# Patient Record
Sex: Female | Born: 1935 | Race: White | Hispanic: No | Smoking: Never smoker
Health system: Southern US, Community
[De-identification: ages and names within clinical notes are randomized; demographics above are authoritative.]

## PROBLEM LIST (undated history)

## (undated) DIAGNOSIS — Z87440 Personal history of urinary (tract) infections: Secondary | ICD-10-CM

## (undated) DIAGNOSIS — F0390 Unspecified dementia without behavioral disturbance: Secondary | ICD-10-CM

## (undated) DIAGNOSIS — E785 Hyperlipidemia, unspecified: Secondary | ICD-10-CM

## (undated) DIAGNOSIS — T7840XA Allergy, unspecified, initial encounter: Secondary | ICD-10-CM

## (undated) DIAGNOSIS — J189 Pneumonia, unspecified organism: Secondary | ICD-10-CM

## (undated) DIAGNOSIS — I10 Essential (primary) hypertension: Secondary | ICD-10-CM

## (undated) DIAGNOSIS — I2510 Atherosclerotic heart disease of native coronary artery without angina pectoris: Secondary | ICD-10-CM

## (undated) DIAGNOSIS — N200 Calculus of kidney: Secondary | ICD-10-CM

## (undated) DIAGNOSIS — K219 Gastro-esophageal reflux disease without esophagitis: Secondary | ICD-10-CM

## (undated) DIAGNOSIS — Z8711 Personal history of peptic ulcer disease: Secondary | ICD-10-CM

## (undated) DIAGNOSIS — I509 Heart failure, unspecified: Secondary | ICD-10-CM

## (undated) DIAGNOSIS — C649 Malignant neoplasm of unspecified kidney, except renal pelvis: Secondary | ICD-10-CM

## (undated) DIAGNOSIS — R51 Headache: Secondary | ICD-10-CM

## (undated) DIAGNOSIS — N189 Chronic kidney disease, unspecified: Secondary | ICD-10-CM

## (undated) DIAGNOSIS — M1990 Unspecified osteoarthritis, unspecified site: Secondary | ICD-10-CM

## (undated) DIAGNOSIS — Z8739 Personal history of other diseases of the musculoskeletal system and connective tissue: Secondary | ICD-10-CM

## (undated) DIAGNOSIS — K589 Irritable bowel syndrome without diarrhea: Secondary | ICD-10-CM

## (undated) DIAGNOSIS — E119 Type 2 diabetes mellitus without complications: Secondary | ICD-10-CM

## (undated) DIAGNOSIS — M8580 Other specified disorders of bone density and structure, unspecified site: Secondary | ICD-10-CM

## (undated) DIAGNOSIS — I219 Acute myocardial infarction, unspecified: Secondary | ICD-10-CM

## (undated) DIAGNOSIS — K8050 Calculus of bile duct without cholangitis or cholecystitis without obstruction: Secondary | ICD-10-CM

## (undated) DIAGNOSIS — Z9289 Personal history of other medical treatment: Secondary | ICD-10-CM

## (undated) DIAGNOSIS — R519 Headache, unspecified: Secondary | ICD-10-CM

## (undated) DIAGNOSIS — Z950 Presence of cardiac pacemaker: Secondary | ICD-10-CM

## (undated) HISTORY — PX: ERCP: SHX60

## (undated) HISTORY — PX: COLONOSCOPY W/ POLYPECTOMY: SHX1380

## (undated) HISTORY — DX: Irritable bowel syndrome, unspecified: K58.9

## (undated) HISTORY — DX: Other specified disorders of bone density and structure, unspecified site: M85.80

## (undated) HISTORY — DX: Atherosclerotic heart disease of native coronary artery without angina pectoris: I25.10

## (undated) HISTORY — PX: PACEMAKER LEAD REMOVAL: SHX5064

## (undated) HISTORY — DX: Heart failure, unspecified: I50.9

## (undated) HISTORY — DX: Hyperlipidemia, unspecified: E78.5

## (undated) HISTORY — DX: Chronic kidney disease, unspecified: N18.9

## (undated) HISTORY — DX: Gastro-esophageal reflux disease without esophagitis: K21.9

## (undated) HISTORY — PX: CYSTOSCOPY W/ STONE MANIPULATION: SHX1427

## (undated) HISTORY — PX: INSERT / REPLACE / REMOVE PACEMAKER: SUR710

## (undated) HISTORY — DX: Essential (primary) hypertension: I10

## (undated) HISTORY — DX: Allergy, unspecified, initial encounter: T78.40XA

## (undated) HISTORY — DX: Calculus of bile duct without cholangitis or cholecystitis without obstruction: K80.50

## (undated) HISTORY — DX: Unspecified osteoarthritis, unspecified site: M19.90

---

## 1980-09-15 DIAGNOSIS — Z9289 Personal history of other medical treatment: Secondary | ICD-10-CM

## 1980-09-15 HISTORY — DX: Personal history of other medical treatment: Z92.89

## 1992-09-15 HISTORY — PX: INCONTINENCE SURGERY: SHX676

## 1992-09-15 HISTORY — PX: ABDOMINAL HYSTERECTOMY: SHX81

## 1993-09-15 HISTORY — PX: CHOLECYSTECTOMY: SHX55

## 1999-10-29 ENCOUNTER — Ambulatory Visit: Admission: RE | Admit: 1999-10-29 | Discharge: 1999-10-29 | Payer: Self-pay | Admitting: Cardiology

## 1999-10-29 ENCOUNTER — Encounter: Payer: Self-pay | Admitting: Emergency Medicine

## 1999-10-29 ENCOUNTER — Inpatient Hospital Stay (HOSPITAL_COMMUNITY): Admission: EM | Admit: 1999-10-29 | Discharge: 1999-10-30 | Payer: Self-pay | Admitting: Emergency Medicine

## 1999-10-30 ENCOUNTER — Encounter: Payer: Self-pay | Admitting: Internal Medicine

## 2000-09-15 DIAGNOSIS — M8580 Other specified disorders of bone density and structure, unspecified site: Secondary | ICD-10-CM

## 2000-09-15 DIAGNOSIS — C649 Malignant neoplasm of unspecified kidney, except renal pelvis: Secondary | ICD-10-CM

## 2000-09-15 HISTORY — DX: Malignant neoplasm of unspecified kidney, except renal pelvis: C64.9

## 2000-09-15 HISTORY — DX: Other specified disorders of bone density and structure, unspecified site: M85.80

## 2000-11-05 ENCOUNTER — Encounter: Payer: Self-pay | Admitting: Family Medicine

## 2000-11-05 ENCOUNTER — Encounter: Admission: RE | Admit: 2000-11-05 | Discharge: 2000-11-05 | Payer: Self-pay | Admitting: Family Medicine

## 2000-12-23 ENCOUNTER — Encounter: Payer: Self-pay | Admitting: Cardiology

## 2000-12-23 ENCOUNTER — Encounter: Admission: RE | Admit: 2000-12-23 | Discharge: 2000-12-23 | Payer: Self-pay | Admitting: Cardiology

## 2001-05-04 ENCOUNTER — Emergency Department (HOSPITAL_COMMUNITY): Admission: EM | Admit: 2001-05-04 | Discharge: 2001-05-04 | Payer: Self-pay | Admitting: *Deleted

## 2001-05-06 ENCOUNTER — Encounter: Payer: Self-pay | Admitting: Urology

## 2001-05-06 ENCOUNTER — Encounter: Admission: RE | Admit: 2001-05-06 | Discharge: 2001-05-06 | Payer: Self-pay | Admitting: Urology

## 2001-05-16 HISTORY — PX: PARTIAL NEPHRECTOMY: SHX414

## 2001-06-01 ENCOUNTER — Encounter: Payer: Self-pay | Admitting: Urology

## 2001-06-07 ENCOUNTER — Inpatient Hospital Stay (HOSPITAL_COMMUNITY): Admission: RE | Admit: 2001-06-07 | Discharge: 2001-06-10 | Payer: Self-pay | Admitting: Urology

## 2001-06-07 ENCOUNTER — Encounter (INDEPENDENT_AMBULATORY_CARE_PROVIDER_SITE_OTHER): Payer: Self-pay | Admitting: *Deleted

## 2001-09-15 DIAGNOSIS — K219 Gastro-esophageal reflux disease without esophagitis: Secondary | ICD-10-CM

## 2001-09-15 HISTORY — DX: Gastro-esophageal reflux disease without esophagitis: K21.9

## 2002-02-18 ENCOUNTER — Encounter: Payer: Self-pay | Admitting: Emergency Medicine

## 2002-02-18 ENCOUNTER — Emergency Department (HOSPITAL_COMMUNITY): Admission: EM | Admit: 2002-02-18 | Discharge: 2002-02-18 | Payer: Self-pay | Admitting: Emergency Medicine

## 2002-02-21 ENCOUNTER — Encounter: Payer: Self-pay | Admitting: Urology

## 2002-02-21 ENCOUNTER — Encounter: Admission: RE | Admit: 2002-02-21 | Discharge: 2002-02-21 | Payer: Self-pay | Admitting: Plastic Surgery

## 2002-02-23 ENCOUNTER — Encounter: Payer: Self-pay | Admitting: Urology

## 2002-02-23 ENCOUNTER — Encounter: Admission: RE | Admit: 2002-02-23 | Discharge: 2002-02-23 | Payer: Self-pay | Admitting: Urology

## 2002-03-10 ENCOUNTER — Encounter: Payer: Self-pay | Admitting: Neurology

## 2002-03-10 ENCOUNTER — Encounter: Admission: RE | Admit: 2002-03-10 | Discharge: 2002-03-10 | Payer: Self-pay | Admitting: Neurology

## 2002-03-15 HISTORY — PX: BI-VENTRICULAR IMPLANTABLE CARDIOVERTER DEFIBRILLATOR  (CRT-D): SHX5747

## 2002-03-16 ENCOUNTER — Encounter: Admission: RE | Admit: 2002-03-16 | Discharge: 2002-06-14 | Payer: Self-pay | Admitting: Neurology

## 2002-03-25 ENCOUNTER — Encounter: Payer: Self-pay | Admitting: Family Medicine

## 2002-03-25 ENCOUNTER — Encounter: Admission: RE | Admit: 2002-03-25 | Discharge: 2002-03-25 | Payer: Self-pay | Admitting: Family Medicine

## 2002-03-29 ENCOUNTER — Inpatient Hospital Stay (HOSPITAL_COMMUNITY): Admission: EM | Admit: 2002-03-29 | Discharge: 2002-04-01 | Payer: Self-pay | Admitting: Emergency Medicine

## 2002-03-29 ENCOUNTER — Encounter: Payer: Self-pay | Admitting: Emergency Medicine

## 2002-03-31 ENCOUNTER — Encounter: Payer: Self-pay | Admitting: Cardiology

## 2002-04-06 ENCOUNTER — Inpatient Hospital Stay (HOSPITAL_COMMUNITY): Admission: AD | Admit: 2002-04-06 | Discharge: 2002-04-13 | Payer: Self-pay | Admitting: Internal Medicine

## 2002-04-07 ENCOUNTER — Encounter: Payer: Self-pay | Admitting: Internal Medicine

## 2002-06-07 ENCOUNTER — Encounter: Payer: Self-pay | Admitting: Urology

## 2002-06-07 ENCOUNTER — Encounter: Admission: RE | Admit: 2002-06-07 | Discharge: 2002-06-07 | Payer: Self-pay | Admitting: Urology

## 2002-07-20 ENCOUNTER — Ambulatory Visit (HOSPITAL_COMMUNITY): Admission: RE | Admit: 2002-07-20 | Discharge: 2002-07-20 | Payer: Self-pay | Admitting: Internal Medicine

## 2002-07-20 ENCOUNTER — Encounter: Payer: Self-pay | Admitting: Internal Medicine

## 2002-07-20 ENCOUNTER — Encounter (INDEPENDENT_AMBULATORY_CARE_PROVIDER_SITE_OTHER): Payer: Self-pay | Admitting: Specialist

## 2003-03-27 ENCOUNTER — Encounter: Payer: Self-pay | Admitting: Family Medicine

## 2003-03-27 ENCOUNTER — Encounter: Admission: RE | Admit: 2003-03-27 | Discharge: 2003-03-27 | Payer: Self-pay | Admitting: Family Medicine

## 2003-04-25 ENCOUNTER — Emergency Department (HOSPITAL_COMMUNITY): Admission: EM | Admit: 2003-04-25 | Discharge: 2003-04-25 | Payer: Self-pay | Admitting: Emergency Medicine

## 2003-04-25 ENCOUNTER — Encounter: Payer: Self-pay | Admitting: Emergency Medicine

## 2003-07-22 ENCOUNTER — Inpatient Hospital Stay (HOSPITAL_COMMUNITY): Admission: EM | Admit: 2003-07-22 | Discharge: 2003-07-26 | Payer: Self-pay

## 2003-07-24 ENCOUNTER — Encounter: Payer: Self-pay | Admitting: Cardiology

## 2003-08-25 ENCOUNTER — Ambulatory Visit (HOSPITAL_COMMUNITY): Admission: RE | Admit: 2003-08-25 | Discharge: 2003-08-25 | Payer: Self-pay | Admitting: Internal Medicine

## 2003-08-30 ENCOUNTER — Ambulatory Visit (HOSPITAL_COMMUNITY): Admission: RE | Admit: 2003-08-30 | Discharge: 2003-08-30 | Payer: Self-pay | Admitting: Internal Medicine

## 2003-09-27 ENCOUNTER — Ambulatory Visit (HOSPITAL_COMMUNITY): Admission: RE | Admit: 2003-09-27 | Discharge: 2003-09-28 | Payer: Self-pay | Admitting: Internal Medicine

## 2004-07-18 ENCOUNTER — Ambulatory Visit: Payer: Self-pay

## 2004-07-24 ENCOUNTER — Ambulatory Visit: Payer: Self-pay | Admitting: Internal Medicine

## 2004-08-17 ENCOUNTER — Ambulatory Visit: Payer: Self-pay | Admitting: Family Medicine

## 2004-09-12 ENCOUNTER — Ambulatory Visit: Payer: Self-pay | Admitting: Family Medicine

## 2004-09-19 ENCOUNTER — Ambulatory Visit: Payer: Self-pay | Admitting: Internal Medicine

## 2004-10-02 ENCOUNTER — Ambulatory Visit: Payer: Self-pay | Admitting: Family Medicine

## 2004-10-02 ENCOUNTER — Encounter: Admission: RE | Admit: 2004-10-02 | Discharge: 2004-10-02 | Payer: Self-pay | Admitting: Family Medicine

## 2004-10-09 ENCOUNTER — Ambulatory Visit: Payer: Self-pay | Admitting: Family Medicine

## 2004-10-15 ENCOUNTER — Ambulatory Visit: Payer: Self-pay | Admitting: Family Medicine

## 2004-10-17 ENCOUNTER — Encounter: Admission: RE | Admit: 2004-10-17 | Discharge: 2004-10-17 | Payer: Self-pay | Admitting: Family Medicine

## 2004-10-17 ENCOUNTER — Ambulatory Visit: Payer: Self-pay | Admitting: Family Medicine

## 2004-10-24 ENCOUNTER — Ambulatory Visit: Payer: Self-pay | Admitting: Family Medicine

## 2004-11-13 ENCOUNTER — Ambulatory Visit: Payer: Self-pay | Admitting: Family Medicine

## 2004-11-15 ENCOUNTER — Ambulatory Visit: Payer: Self-pay | Admitting: Family Medicine

## 2004-11-19 ENCOUNTER — Ambulatory Visit: Payer: Self-pay | Admitting: Cardiovascular Disease

## 2004-11-29 ENCOUNTER — Ambulatory Visit: Payer: Self-pay | Admitting: Family Medicine

## 2004-12-14 ENCOUNTER — Ambulatory Visit: Payer: Self-pay | Admitting: Family Medicine

## 2005-01-13 ENCOUNTER — Ambulatory Visit: Payer: Self-pay | Admitting: Family Medicine

## 2005-01-30 ENCOUNTER — Ambulatory Visit: Payer: Self-pay | Admitting: Family Medicine

## 2005-03-04 ENCOUNTER — Inpatient Hospital Stay (HOSPITAL_COMMUNITY): Admission: EM | Admit: 2005-03-04 | Discharge: 2005-03-10 | Payer: Self-pay | Admitting: Emergency Medicine

## 2005-03-05 ENCOUNTER — Ambulatory Visit: Payer: Self-pay | Admitting: Internal Medicine

## 2005-03-06 ENCOUNTER — Ambulatory Visit: Payer: Self-pay | Admitting: Gastroenterology

## 2005-03-07 ENCOUNTER — Encounter: Payer: Self-pay | Admitting: Gastroenterology

## 2005-03-19 ENCOUNTER — Ambulatory Visit: Payer: Self-pay | Admitting: Family Medicine

## 2005-04-22 ENCOUNTER — Ambulatory Visit: Payer: Self-pay | Admitting: Family Medicine

## 2005-05-05 ENCOUNTER — Ambulatory Visit: Payer: Self-pay | Admitting: Family Medicine

## 2005-05-20 ENCOUNTER — Ambulatory Visit: Payer: Self-pay | Admitting: Family Medicine

## 2005-05-29 ENCOUNTER — Ambulatory Visit: Payer: Self-pay | Admitting: Family Medicine

## 2005-06-03 ENCOUNTER — Ambulatory Visit: Payer: Self-pay | Admitting: Cardiology

## 2005-06-04 ENCOUNTER — Ambulatory Visit: Payer: Self-pay | Admitting: Internal Medicine

## 2005-06-05 ENCOUNTER — Ambulatory Visit: Payer: Self-pay

## 2005-06-25 ENCOUNTER — Ambulatory Visit: Payer: Self-pay | Admitting: Family Medicine

## 2005-08-01 ENCOUNTER — Ambulatory Visit: Payer: Self-pay | Admitting: Family Medicine

## 2005-09-01 ENCOUNTER — Ambulatory Visit: Payer: Self-pay | Admitting: Internal Medicine

## 2005-11-03 ENCOUNTER — Ambulatory Visit: Payer: Self-pay | Admitting: Family Medicine

## 2005-11-17 ENCOUNTER — Ambulatory Visit: Payer: Self-pay | Admitting: Family Medicine

## 2005-12-23 ENCOUNTER — Ambulatory Visit: Payer: Self-pay | Admitting: Internal Medicine

## 2005-12-26 ENCOUNTER — Ambulatory Visit: Payer: Self-pay | Admitting: Cardiology

## 2006-01-22 ENCOUNTER — Ambulatory Visit: Payer: Self-pay | Admitting: Internal Medicine

## 2006-02-25 ENCOUNTER — Ambulatory Visit: Payer: Self-pay | Admitting: Family Medicine

## 2006-02-26 ENCOUNTER — Ambulatory Visit: Payer: Self-pay | Admitting: Internal Medicine

## 2006-03-26 ENCOUNTER — Ambulatory Visit: Payer: Self-pay | Admitting: Internal Medicine

## 2006-04-23 ENCOUNTER — Ambulatory Visit: Payer: Self-pay | Admitting: Internal Medicine

## 2006-05-16 DIAGNOSIS — N189 Chronic kidney disease, unspecified: Secondary | ICD-10-CM

## 2006-05-16 HISTORY — DX: Chronic kidney disease, unspecified: N18.9

## 2006-05-28 ENCOUNTER — Ambulatory Visit: Payer: Self-pay | Admitting: Internal Medicine

## 2006-05-30 ENCOUNTER — Ambulatory Visit: Payer: Self-pay | Admitting: Family Medicine

## 2006-06-04 ENCOUNTER — Inpatient Hospital Stay (HOSPITAL_COMMUNITY): Admission: EM | Admit: 2006-06-04 | Discharge: 2006-06-17 | Payer: Self-pay | Admitting: Emergency Medicine

## 2006-06-08 ENCOUNTER — Ambulatory Visit: Payer: Self-pay | Admitting: Internal Medicine

## 2006-06-23 ENCOUNTER — Ambulatory Visit: Payer: Self-pay | Admitting: Family Medicine

## 2006-06-25 ENCOUNTER — Ambulatory Visit: Payer: Self-pay | Admitting: Internal Medicine

## 2006-06-26 ENCOUNTER — Ambulatory Visit: Payer: Self-pay | Admitting: Cardiology

## 2006-07-15 ENCOUNTER — Ambulatory Visit: Payer: Self-pay | Admitting: Family Medicine

## 2006-07-23 ENCOUNTER — Ambulatory Visit: Payer: Self-pay | Admitting: Internal Medicine

## 2006-07-30 ENCOUNTER — Ambulatory Visit: Payer: Self-pay | Admitting: Family Medicine

## 2006-08-13 ENCOUNTER — Ambulatory Visit: Payer: Self-pay | Admitting: Internal Medicine

## 2006-08-25 ENCOUNTER — Ambulatory Visit: Payer: Self-pay | Admitting: Family Medicine

## 2006-08-27 ENCOUNTER — Ambulatory Visit: Payer: Self-pay | Admitting: Internal Medicine

## 2006-09-01 ENCOUNTER — Ambulatory Visit: Payer: Self-pay | Admitting: Family Medicine

## 2006-09-15 HISTORY — PX: COLONOSCOPY: SHX174

## 2006-09-22 ENCOUNTER — Ambulatory Visit: Payer: Self-pay | Admitting: Internal Medicine

## 2006-09-24 ENCOUNTER — Ambulatory Visit: Payer: Self-pay | Admitting: Internal Medicine

## 2006-09-25 ENCOUNTER — Ambulatory Visit: Payer: Self-pay | Admitting: Family Medicine

## 2006-09-25 LAB — CONVERTED CEMR LAB
CO2: 26 meq/L (ref 19–32)
Glucose, Bld: 95 mg/dL (ref 70–99)
Lymphocytes Relative: 35 % (ref 12–46)
Neutro Abs: 2.6 10*3/uL (ref 1.7–7.7)
Platelets: 154 10*3/uL (ref 150–400)
Potassium: 4.3 meq/L (ref 3.5–5.3)
RDW: 14.2 % — ABNORMAL HIGH (ref 11.5–14.0)
Sodium: 143 meq/L (ref 135–145)

## 2006-10-05 ENCOUNTER — Ambulatory Visit: Payer: Self-pay

## 2006-10-09 ENCOUNTER — Ambulatory Visit: Payer: Self-pay | Admitting: Internal Medicine

## 2006-10-12 ENCOUNTER — Ambulatory Visit: Payer: Self-pay | Admitting: Internal Medicine

## 2006-10-12 ENCOUNTER — Encounter (INDEPENDENT_AMBULATORY_CARE_PROVIDER_SITE_OTHER): Payer: Self-pay | Admitting: Specialist

## 2006-10-26 ENCOUNTER — Ambulatory Visit: Payer: Self-pay | Admitting: Internal Medicine

## 2006-10-29 ENCOUNTER — Ambulatory Visit: Payer: Self-pay | Admitting: Internal Medicine

## 2006-12-14 ENCOUNTER — Ambulatory Visit: Payer: Self-pay | Admitting: Ophthalmology

## 2006-12-15 HISTORY — PX: CATARACT EXTRACTION W/ INTRAOCULAR LENS IMPLANT: SHX1309

## 2006-12-21 ENCOUNTER — Ambulatory Visit: Payer: Self-pay | Admitting: Ophthalmology

## 2006-12-28 ENCOUNTER — Encounter: Payer: Self-pay | Admitting: Family Medicine

## 2006-12-28 DIAGNOSIS — N19 Unspecified kidney failure: Secondary | ICD-10-CM | POA: Insufficient documentation

## 2006-12-28 DIAGNOSIS — M810 Age-related osteoporosis without current pathological fracture: Secondary | ICD-10-CM | POA: Insufficient documentation

## 2006-12-28 DIAGNOSIS — N390 Urinary tract infection, site not specified: Secondary | ICD-10-CM

## 2006-12-28 DIAGNOSIS — I10 Essential (primary) hypertension: Secondary | ICD-10-CM | POA: Insufficient documentation

## 2006-12-28 DIAGNOSIS — M1990 Unspecified osteoarthritis, unspecified site: Secondary | ICD-10-CM | POA: Insufficient documentation

## 2006-12-28 DIAGNOSIS — I2510 Atherosclerotic heart disease of native coronary artery without angina pectoris: Secondary | ICD-10-CM

## 2006-12-28 DIAGNOSIS — E119 Type 2 diabetes mellitus without complications: Secondary | ICD-10-CM

## 2006-12-28 DIAGNOSIS — E785 Hyperlipidemia, unspecified: Secondary | ICD-10-CM | POA: Insufficient documentation

## 2006-12-28 DIAGNOSIS — M109 Gout, unspecified: Secondary | ICD-10-CM

## 2006-12-28 DIAGNOSIS — J309 Allergic rhinitis, unspecified: Secondary | ICD-10-CM | POA: Insufficient documentation

## 2006-12-31 ENCOUNTER — Ambulatory Visit: Payer: Self-pay | Admitting: Internal Medicine

## 2007-01-22 ENCOUNTER — Ambulatory Visit: Payer: Self-pay | Admitting: Cardiology

## 2007-01-27 ENCOUNTER — Encounter: Payer: Self-pay | Admitting: Cardiovascular Disease

## 2007-01-27 ENCOUNTER — Ambulatory Visit: Payer: Self-pay

## 2007-01-27 LAB — CONVERTED CEMR LAB
CO2: 35 meq/L — ABNORMAL HIGH (ref 19–32)
Calcium: 9 mg/dL (ref 8.4–10.5)
Chloride: 105 meq/L (ref 96–112)
GFR calc non Af Amer: 46 mL/min
Glucose, Bld: 130 mg/dL — ABNORMAL HIGH (ref 70–99)

## 2007-02-03 ENCOUNTER — Ambulatory Visit: Payer: Self-pay

## 2007-02-04 ENCOUNTER — Ambulatory Visit: Payer: Self-pay | Admitting: Cardiology

## 2007-02-04 LAB — CONVERTED CEMR LAB
Chloride: 102 meq/L (ref 96–112)
Creatinine, Ser: 1.3 mg/dL — ABNORMAL HIGH (ref 0.4–1.2)
Glucose, Bld: 122 mg/dL — ABNORMAL HIGH (ref 70–99)
Pro B Natriuretic peptide (BNP): 123 pg/mL — ABNORMAL HIGH (ref 0.0–100.0)
Sodium: 141 meq/L (ref 135–145)

## 2007-04-16 ENCOUNTER — Ambulatory Visit: Payer: Self-pay | Admitting: Internal Medicine

## 2007-05-06 ENCOUNTER — Ambulatory Visit: Payer: Self-pay | Admitting: Internal Medicine

## 2007-06-24 ENCOUNTER — Ambulatory Visit: Payer: Self-pay | Admitting: Internal Medicine

## 2007-06-25 ENCOUNTER — Ambulatory Visit: Payer: Self-pay | Admitting: Family Medicine

## 2007-06-30 ENCOUNTER — Ambulatory Visit: Payer: Self-pay | Admitting: Internal Medicine

## 2007-07-06 ENCOUNTER — Ambulatory Visit: Payer: Self-pay | Admitting: Internal Medicine

## 2007-07-06 ENCOUNTER — Encounter: Payer: Self-pay | Admitting: Family Medicine

## 2007-07-06 LAB — CONVERTED CEMR LAB
Basophils Relative: 0.4 % (ref 0.0–1.0)
CO2: 31 meq/L (ref 19–32)
Chloride: 106 meq/L (ref 96–112)
Creatinine, Ser: 1.1 mg/dL (ref 0.4–1.2)
Eosinophils Absolute: 0.1 10*3/uL (ref 0.0–0.6)
Eosinophils Relative: 1.2 % (ref 0.0–5.0)
Glucose, Bld: 146 mg/dL — ABNORMAL HIGH (ref 70–99)
HCT: 32.8 % — ABNORMAL LOW (ref 36.0–46.0)
MCV: 86.3 fL (ref 78.0–100.0)
Neutrophils Relative %: 69.2 % (ref 43.0–77.0)
Prothrombin Time: 11.4 s (ref 10.9–13.3)
RBC: 3.8 M/uL — ABNORMAL LOW (ref 3.87–5.11)
RDW: 13 % (ref 11.5–14.6)
Sodium: 142 meq/L (ref 135–145)
WBC: 5.5 10*3/uL (ref 4.5–10.5)

## 2007-07-12 ENCOUNTER — Ambulatory Visit: Payer: Self-pay | Admitting: Internal Medicine

## 2007-07-12 ENCOUNTER — Ambulatory Visit (HOSPITAL_COMMUNITY): Admission: RE | Admit: 2007-07-12 | Discharge: 2007-07-13 | Payer: Self-pay | Admitting: Internal Medicine

## 2007-07-21 ENCOUNTER — Ambulatory Visit: Payer: Self-pay

## 2007-07-26 ENCOUNTER — Telehealth: Payer: Self-pay | Admitting: Family Medicine

## 2007-08-03 ENCOUNTER — Encounter: Payer: Self-pay | Admitting: Family Medicine

## 2007-08-23 ENCOUNTER — Ambulatory Visit: Payer: Self-pay | Admitting: Internal Medicine

## 2007-08-25 ENCOUNTER — Ambulatory Visit: Payer: Self-pay | Admitting: Family Medicine

## 2007-08-31 LAB — CONVERTED CEMR LAB
Direct LDL: 100.2 mg/dL
HDL: 30.3 mg/dL — ABNORMAL LOW (ref >39.0)
Total CHOL/HDL Ratio: 7
Triglycerides: 444 mg/dL (ref 0–149)
VLDL: 89 mg/dL — ABNORMAL HIGH (ref 0–40)

## 2007-10-18 ENCOUNTER — Ambulatory Visit: Payer: Self-pay | Admitting: Internal Medicine

## 2007-11-05 ENCOUNTER — Ambulatory Visit: Payer: Self-pay | Admitting: Family Medicine

## 2007-11-05 LAB — CONVERTED CEMR LAB
ALT: 13 units/L (ref 0–35)
AST: 14 units/L (ref 0–37)
Alkaline Phosphatase: 87 units/L (ref 39–117)
Bilirubin, Direct: 0.1 mg/dL (ref 0.0–0.3)
Cholesterol: 134 mg/dL (ref 0–200)
Total Protein: 7 g/dL (ref 6.0–8.3)

## 2007-12-16 ENCOUNTER — Encounter (INDEPENDENT_AMBULATORY_CARE_PROVIDER_SITE_OTHER): Payer: Self-pay | Admitting: *Deleted

## 2007-12-23 ENCOUNTER — Telehealth: Payer: Self-pay | Admitting: Family Medicine

## 2008-01-03 ENCOUNTER — Ambulatory Visit: Payer: Self-pay | Admitting: Internal Medicine

## 2008-01-03 LAB — CONVERTED CEMR LAB
BUN: 22 mg/dL (ref 6–23)
Creatinine, Ser: 1 mg/dL (ref 0.4–1.2)
GFR calc Af Amer: 68 mL/min
GFR calc non Af Amer: 57 mL/min
Glucose, Bld: 252 mg/dL — ABNORMAL HIGH (ref 70–99)

## 2008-01-06 ENCOUNTER — Encounter: Payer: Self-pay | Admitting: Family Medicine

## 2008-01-19 ENCOUNTER — Ambulatory Visit: Payer: Self-pay | Admitting: Internal Medicine

## 2008-01-19 LAB — CONVERTED CEMR LAB
BUN: 27 mg/dL — ABNORMAL HIGH (ref 6–23)
Chloride: 105 meq/L (ref 96–112)
Creatinine, Ser: 1 mg/dL (ref 0.4–1.2)
GFR calc Af Amer: 68 mL/min
GFR calc non Af Amer: 56 mL/min
Glucose, Bld: 144 mg/dL — ABNORMAL HIGH (ref 70–99)
Potassium: 4.2 meq/L (ref 3.5–5.1)

## 2008-01-20 ENCOUNTER — Ambulatory Visit: Payer: Self-pay | Admitting: Internal Medicine

## 2008-01-27 ENCOUNTER — Telehealth: Payer: Self-pay | Admitting: Family Medicine

## 2008-02-21 ENCOUNTER — Encounter (INDEPENDENT_AMBULATORY_CARE_PROVIDER_SITE_OTHER): Payer: Self-pay | Admitting: *Deleted

## 2008-02-28 ENCOUNTER — Ambulatory Visit: Payer: Self-pay | Admitting: Internal Medicine

## 2008-04-20 ENCOUNTER — Ambulatory Visit: Payer: Self-pay | Admitting: Internal Medicine

## 2008-04-29 ENCOUNTER — Emergency Department (HOSPITAL_COMMUNITY): Admission: EM | Admit: 2008-04-29 | Discharge: 2008-04-29 | Payer: Self-pay | Admitting: Family Medicine

## 2008-05-29 ENCOUNTER — Telehealth (INDEPENDENT_AMBULATORY_CARE_PROVIDER_SITE_OTHER): Payer: Self-pay | Admitting: *Deleted

## 2008-06-12 ENCOUNTER — Ambulatory Visit: Payer: Self-pay | Admitting: Family Medicine

## 2008-07-20 ENCOUNTER — Ambulatory Visit: Payer: Self-pay | Admitting: Internal Medicine

## 2008-07-24 ENCOUNTER — Ambulatory Visit: Payer: Self-pay | Admitting: Internal Medicine

## 2008-07-25 ENCOUNTER — Telehealth: Payer: Self-pay | Admitting: Family Medicine

## 2008-08-07 ENCOUNTER — Encounter: Payer: Self-pay | Admitting: Family Medicine

## 2008-08-07 ENCOUNTER — Ambulatory Visit: Payer: Self-pay | Admitting: Internal Medicine

## 2008-08-18 LAB — CONVERTED CEMR LAB
ALT: 41 units/L — ABNORMAL HIGH (ref 0–35)
AST: 53 units/L — ABNORMAL HIGH (ref 0–37)
BUN: 33 mg/dL — ABNORMAL HIGH (ref 6–23)
Chloride: 102 meq/L (ref 96–112)
Direct LDL: 42.3 mg/dL
GFR calc Af Amer: 61 mL/min
GFR calc non Af Amer: 51 mL/min
HDL: 31.7 mg/dL — ABNORMAL LOW (ref >39.0)
Hgb A1c MFr Bld: 7.6 % — ABNORMAL HIGH (ref 4.6–6.0)
Potassium: 4.2 meq/L (ref 3.5–5.1)
Sodium: 140 meq/L (ref 135–145)
VLDL: 51 mg/dL — ABNORMAL HIGH (ref 0–40)

## 2008-08-23 ENCOUNTER — Ambulatory Visit: Payer: Self-pay | Admitting: Family Medicine

## 2008-10-23 ENCOUNTER — Ambulatory Visit: Payer: Self-pay | Admitting: Internal Medicine

## 2008-11-16 ENCOUNTER — Ambulatory Visit: Payer: Self-pay | Admitting: Family Medicine

## 2008-11-16 DIAGNOSIS — K5289 Other specified noninfective gastroenteritis and colitis: Secondary | ICD-10-CM

## 2008-11-17 ENCOUNTER — Telehealth (INDEPENDENT_AMBULATORY_CARE_PROVIDER_SITE_OTHER): Payer: Self-pay | Admitting: Internal Medicine

## 2009-01-23 ENCOUNTER — Telehealth: Payer: Self-pay | Admitting: Family Medicine

## 2009-01-24 ENCOUNTER — Telehealth: Payer: Self-pay | Admitting: Internal Medicine

## 2009-01-24 ENCOUNTER — Emergency Department (HOSPITAL_COMMUNITY): Admission: EM | Admit: 2009-01-24 | Discharge: 2009-01-24 | Payer: Self-pay | Admitting: Family Medicine

## 2009-01-24 ENCOUNTER — Telehealth: Payer: Self-pay | Admitting: Family Medicine

## 2009-01-24 ENCOUNTER — Encounter (INDEPENDENT_AMBULATORY_CARE_PROVIDER_SITE_OTHER): Payer: Self-pay | Admitting: *Deleted

## 2009-01-25 ENCOUNTER — Ambulatory Visit: Payer: Self-pay | Admitting: Internal Medicine

## 2009-01-25 DIAGNOSIS — I728 Aneurysm of other specified arteries: Secondary | ICD-10-CM | POA: Insufficient documentation

## 2009-01-25 DIAGNOSIS — Z860100 Personal history of colon polyps, unspecified: Secondary | ICD-10-CM | POA: Insufficient documentation

## 2009-01-29 DIAGNOSIS — I429 Cardiomyopathy, unspecified: Secondary | ICD-10-CM | POA: Insufficient documentation

## 2009-01-29 DIAGNOSIS — K589 Irritable bowel syndrome without diarrhea: Secondary | ICD-10-CM

## 2009-01-29 DIAGNOSIS — I442 Atrioventricular block, complete: Secondary | ICD-10-CM

## 2009-01-29 DIAGNOSIS — I5022 Chronic systolic (congestive) heart failure: Secondary | ICD-10-CM

## 2009-02-14 ENCOUNTER — Ambulatory Visit: Payer: Self-pay | Admitting: Internal Medicine

## 2009-02-14 ENCOUNTER — Encounter: Payer: Self-pay | Admitting: Internal Medicine

## 2009-02-22 ENCOUNTER — Encounter: Payer: Self-pay | Admitting: Internal Medicine

## 2009-02-25 ENCOUNTER — Encounter: Payer: Self-pay | Admitting: Internal Medicine

## 2009-02-26 ENCOUNTER — Ambulatory Visit: Payer: Self-pay | Admitting: Internal Medicine

## 2009-04-06 ENCOUNTER — Encounter: Payer: Self-pay | Admitting: Internal Medicine

## 2009-04-26 ENCOUNTER — Ambulatory Visit: Payer: Self-pay | Admitting: Internal Medicine

## 2009-05-03 ENCOUNTER — Ambulatory Visit: Payer: Self-pay | Admitting: Gastroenterology

## 2009-05-03 ENCOUNTER — Inpatient Hospital Stay (HOSPITAL_COMMUNITY): Admission: EM | Admit: 2009-05-03 | Discharge: 2009-05-06 | Payer: Self-pay | Admitting: Emergency Medicine

## 2009-05-03 ENCOUNTER — Encounter: Payer: Self-pay | Admitting: Cardiovascular Disease

## 2009-05-03 ENCOUNTER — Ambulatory Visit: Payer: Self-pay | Admitting: Vascular Surgery

## 2009-05-03 ENCOUNTER — Ambulatory Visit: Payer: Self-pay | Admitting: Cardiovascular Disease

## 2009-05-04 ENCOUNTER — Encounter: Payer: Self-pay | Admitting: Cardiology

## 2009-05-05 ENCOUNTER — Encounter: Payer: Self-pay | Admitting: Cardiovascular Disease

## 2009-05-07 ENCOUNTER — Telehealth: Payer: Self-pay | Admitting: Internal Medicine

## 2009-05-18 ENCOUNTER — Ambulatory Visit: Payer: Self-pay | Admitting: Family Medicine

## 2009-05-18 LAB — CONVERTED CEMR LAB
Bacteria, UA: 0
Bilirubin Urine: NEGATIVE
Glucose, Urine, Semiquant: NEGATIVE
Ketones, urine, test strip: NEGATIVE
RBC / HPF: 0
Specific Gravity, Urine: 1.015

## 2009-05-19 ENCOUNTER — Encounter (INDEPENDENT_AMBULATORY_CARE_PROVIDER_SITE_OTHER): Payer: Self-pay | Admitting: Internal Medicine

## 2009-05-21 LAB — CONVERTED CEMR LAB
BUN: 30 mg/dL — ABNORMAL HIGH (ref 6–23)
Basophils Absolute: 0 10*3/uL (ref 0.0–0.1)
Calcium: 8.9 mg/dL (ref 8.4–10.5)
GFR calc non Af Amer: 45.56 mL/min (ref >60)
Glucose, Bld: 156 mg/dL — ABNORMAL HIGH (ref 70–99)
Lymphocytes Relative: 7.5 % — ABNORMAL LOW (ref 12.0–46.0)
Monocytes Relative: 9.5 % (ref 3.0–12.0)
Platelets: 200 10*3/uL (ref 150.0–400.0)
RDW: 14.2 % (ref 11.5–14.6)
Sodium: 144 meq/L (ref 135–145)
WBC: 10 10*3/uL (ref 4.5–10.5)

## 2009-05-22 ENCOUNTER — Encounter (INDEPENDENT_AMBULATORY_CARE_PROVIDER_SITE_OTHER): Payer: Self-pay | Admitting: Internal Medicine

## 2009-05-24 ENCOUNTER — Ambulatory Visit: Payer: Self-pay | Admitting: Internal Medicine

## 2009-05-25 ENCOUNTER — Ambulatory Visit: Payer: Self-pay | Admitting: Internal Medicine

## 2009-05-25 LAB — CONVERTED CEMR LAB
ALT: 50 units/L — ABNORMAL HIGH (ref 0–35)
AST: 28 units/L (ref 0–37)
Alkaline Phosphatase: 278 units/L — ABNORMAL HIGH (ref 39–117)
Basophils Absolute: 0 10*3/uL (ref 0.0–0.1)
Bilirubin, Direct: 0 mg/dL (ref 0.0–0.3)
CO2: 30 meq/L (ref 19–32)
Calcium: 9.3 mg/dL (ref 8.4–10.5)
Chloride: 103 meq/L (ref 96–112)
Eosinophils Absolute: 0.1 10*3/uL (ref 0.0–0.7)
Hemoglobin: 11 g/dL — ABNORMAL LOW (ref 12.0–15.0)
Lymphocytes Relative: 18.5 % (ref 12.0–46.0)
MCHC: 34.3 g/dL (ref 30.0–36.0)
Neutro Abs: 5.3 10*3/uL (ref 1.4–7.7)
Potassium: 4.3 meq/L (ref 3.5–5.1)
RDW: 14.3 % (ref 11.5–14.6)
Sodium: 141 meq/L (ref 135–145)
Total Protein: 6.8 g/dL (ref 6.0–8.3)

## 2009-05-30 ENCOUNTER — Ambulatory Visit (HOSPITAL_COMMUNITY): Admission: RE | Admit: 2009-05-30 | Discharge: 2009-05-30 | Payer: Self-pay | Admitting: Internal Medicine

## 2009-05-30 ENCOUNTER — Ambulatory Visit: Payer: Self-pay | Admitting: Internal Medicine

## 2009-06-04 ENCOUNTER — Telehealth: Payer: Self-pay | Admitting: Internal Medicine

## 2009-06-07 ENCOUNTER — Ambulatory Visit: Payer: Self-pay | Admitting: Family Medicine

## 2009-06-08 ENCOUNTER — Encounter: Payer: Self-pay | Admitting: Family Medicine

## 2009-06-12 ENCOUNTER — Ambulatory Visit: Payer: Self-pay | Admitting: Family Medicine

## 2009-07-11 ENCOUNTER — Ambulatory Visit: Payer: Self-pay | Admitting: Internal Medicine

## 2009-07-16 ENCOUNTER — Telehealth: Payer: Self-pay | Admitting: Family Medicine

## 2009-07-17 ENCOUNTER — Telehealth: Payer: Self-pay | Admitting: Internal Medicine

## 2009-07-20 ENCOUNTER — Ambulatory Visit: Payer: Self-pay | Admitting: Internal Medicine

## 2009-07-23 ENCOUNTER — Encounter: Payer: Self-pay | Admitting: Internal Medicine

## 2009-07-26 ENCOUNTER — Ambulatory Visit: Payer: Self-pay | Admitting: Internal Medicine

## 2009-07-30 ENCOUNTER — Encounter: Payer: Self-pay | Admitting: Internal Medicine

## 2009-07-31 ENCOUNTER — Ambulatory Visit: Payer: Self-pay | Admitting: Internal Medicine

## 2009-07-31 ENCOUNTER — Ambulatory Visit (HOSPITAL_COMMUNITY): Admission: RE | Admit: 2009-07-31 | Discharge: 2009-07-31 | Payer: Self-pay | Admitting: Internal Medicine

## 2009-08-08 ENCOUNTER — Encounter: Payer: Self-pay | Admitting: Internal Medicine

## 2009-08-15 LAB — HM DIABETES EYE EXAM: HM Diabetic Eye Exam: NORMAL

## 2009-08-17 ENCOUNTER — Ambulatory Visit: Payer: Self-pay | Admitting: Family Medicine

## 2009-09-05 ENCOUNTER — Telehealth: Payer: Self-pay | Admitting: Internal Medicine

## 2009-09-17 ENCOUNTER — Telehealth: Payer: Self-pay | Admitting: Internal Medicine

## 2009-09-21 ENCOUNTER — Ambulatory Visit: Payer: Self-pay | Admitting: Gastroenterology

## 2009-10-01 ENCOUNTER — Encounter: Payer: Self-pay | Admitting: Internal Medicine

## 2009-10-01 ENCOUNTER — Telehealth: Payer: Self-pay | Admitting: Gastroenterology

## 2009-10-01 ENCOUNTER — Telehealth: Payer: Self-pay | Admitting: Internal Medicine

## 2009-10-03 ENCOUNTER — Ambulatory Visit (HOSPITAL_COMMUNITY): Admission: RE | Admit: 2009-10-03 | Discharge: 2009-10-04 | Payer: Self-pay | Admitting: Gastroenterology

## 2009-10-03 ENCOUNTER — Encounter: Payer: Self-pay | Admitting: Gastroenterology

## 2009-10-04 ENCOUNTER — Ambulatory Visit: Payer: Self-pay | Admitting: Gastroenterology

## 2009-10-22 ENCOUNTER — Ambulatory Visit: Payer: Self-pay | Admitting: Internal Medicine

## 2009-10-22 DIAGNOSIS — E7800 Pure hypercholesterolemia, unspecified: Secondary | ICD-10-CM | POA: Insufficient documentation

## 2009-10-23 ENCOUNTER — Encounter: Payer: Self-pay | Admitting: Internal Medicine

## 2009-11-05 ENCOUNTER — Ambulatory Visit: Payer: Self-pay | Admitting: Internal Medicine

## 2009-11-07 LAB — CONVERTED CEMR LAB
AST: 17 units/L (ref 0–37)
Albumin: 3.8 g/dL (ref 3.5–5.2)
Alkaline Phosphatase: 91 units/L (ref 39–117)
Total Bilirubin: 0.4 mg/dL (ref 0.3–1.2)

## 2010-01-09 ENCOUNTER — Telehealth: Payer: Self-pay | Admitting: Family Medicine

## 2010-01-24 ENCOUNTER — Ambulatory Visit: Payer: Self-pay | Admitting: Internal Medicine

## 2010-02-18 ENCOUNTER — Ambulatory Visit: Payer: Self-pay | Admitting: Family Medicine

## 2010-02-19 LAB — CONVERTED CEMR LAB
ALT: 10 units/L (ref 0–35)
AST: 14 units/L (ref 0–37)
Creatinine, Ser: 1 mg/dL (ref 0.4–1.2)
Direct LDL: 53.8 mg/dL
GFR calc non Af Amer: 53.65 mL/min (ref >60)
Glucose, Bld: 161 mg/dL — ABNORMAL HIGH (ref 70–99)
Phosphorus: 3.4 mg/dL (ref 2.3–4.6)
Sodium: 144 meq/L (ref 135–145)
Total CHOL/HDL Ratio: 5
VLDL: 88.4 mg/dL — ABNORMAL HIGH (ref 0.0–40.0)

## 2010-03-29 ENCOUNTER — Encounter: Payer: Self-pay | Admitting: Family Medicine

## 2010-04-25 ENCOUNTER — Ambulatory Visit: Payer: Self-pay | Admitting: Internal Medicine

## 2010-04-26 ENCOUNTER — Encounter: Payer: Self-pay | Admitting: Family Medicine

## 2010-04-26 ENCOUNTER — Encounter: Payer: Self-pay | Admitting: Internal Medicine

## 2010-05-24 ENCOUNTER — Ambulatory Visit: Payer: Self-pay | Admitting: Internal Medicine

## 2010-05-24 DIAGNOSIS — E0590 Thyrotoxicosis, unspecified without thyrotoxic crisis or storm: Secondary | ICD-10-CM | POA: Insufficient documentation

## 2010-06-04 ENCOUNTER — Ambulatory Visit: Payer: Self-pay | Admitting: Family Medicine

## 2010-06-10 ENCOUNTER — Ambulatory Visit: Payer: Self-pay | Admitting: Internal Medicine

## 2010-06-10 ENCOUNTER — Encounter: Payer: Self-pay | Admitting: Internal Medicine

## 2010-06-10 ENCOUNTER — Ambulatory Visit (HOSPITAL_COMMUNITY): Admission: RE | Admit: 2010-06-10 | Discharge: 2010-06-10 | Payer: Self-pay | Admitting: Internal Medicine

## 2010-06-10 ENCOUNTER — Ambulatory Visit: Payer: Self-pay

## 2010-06-19 ENCOUNTER — Telehealth (INDEPENDENT_AMBULATORY_CARE_PROVIDER_SITE_OTHER): Payer: Self-pay | Admitting: *Deleted

## 2010-08-01 ENCOUNTER — Ambulatory Visit: Payer: Self-pay | Admitting: Internal Medicine

## 2010-08-26 ENCOUNTER — Ambulatory Visit: Payer: Self-pay | Admitting: Family Medicine

## 2010-08-26 LAB — CONVERTED CEMR LAB
Cholesterol, target level: 200 mg/dL
HDL goal, serum: 40 mg/dL
LDL Goal: 70 mg/dL

## 2010-08-26 LAB — HM DIABETES FOOT EXAM

## 2010-08-27 LAB — CONVERTED CEMR LAB
AST: 15 units/L (ref 0–37)
BUN: 20 mg/dL (ref 6–23)
CO2: 29 meq/L (ref 19–32)
Chloride: 104 meq/L (ref 96–112)
Direct LDL: 75.3 mg/dL
GFR calc non Af Amer: 66.72 mL/min (ref >60.00)
HDL: 33.1 mg/dL — ABNORMAL LOW (ref >39.00)
Potassium: 4.3 meq/L (ref 3.5–5.1)

## 2010-08-29 ENCOUNTER — Encounter (INDEPENDENT_AMBULATORY_CARE_PROVIDER_SITE_OTHER): Payer: Self-pay | Admitting: *Deleted

## 2010-10-13 LAB — CONVERTED CEMR LAB
Alkaline Phosphatase: 79 units/L (ref 39–117)
BUN: 29 mg/dL — ABNORMAL HIGH (ref 6–23)
Basophils Absolute: 0 10*3/uL (ref 0.0–0.1)
Basophils Absolute: 0 10*3/uL (ref 0.0–0.1)
Bilirubin, Direct: 0 mg/dL (ref 0.0–0.3)
CO2: 28 meq/L (ref 19–32)
Calcium: 8.7 mg/dL (ref 8.4–10.5)
Calcium: 8.9 mg/dL (ref 8.4–10.5)
Chloride: 104 meq/L (ref 96–112)
Creatinine, Ser: 1.1 mg/dL (ref 0.4–1.2)
Direct LDL: 77.1 mg/dL
Eosinophils Relative: 1.7 % (ref 0.0–5.0)
GFR calc non Af Amer: 50.4 mL/min (ref >60)
Glucose, Bld: 253 mg/dL — ABNORMAL HIGH (ref 70–99)
HCT: 33.7 % — ABNORMAL LOW (ref 36.0–46.0)
Hgb A1c MFr Bld: 8.2 % — ABNORMAL HIGH (ref 4.6–6.5)
Lymphocytes Relative: 19.3 % (ref 12.0–46.0)
Lymphocytes Relative: 29 % (ref 12–46)
Lymphs Abs: 1.2 10*3/uL (ref 0.7–4.0)
Monocytes Relative: 5.7 % (ref 3.0–12.0)
Neutro Abs: 3.8 10*3/uL (ref 1.7–7.7)
Neutrophils Relative %: 73.2 % (ref 43.0–77.0)
Platelets: 177 10*3/uL (ref 150.0–400.0)
Platelets: 218 10*3/uL (ref 150–400)
Potassium: 4 meq/L (ref 3.5–5.1)
RDW: 13.5 % (ref 11.5–14.6)
RDW: 14 % (ref 11.5–15.5)
Sodium: 144 meq/L (ref 135–145)
TSH: 0.6 microintl units/mL (ref 0.35–5.50)
Total Protein: 6.6 g/dL (ref 6.0–8.3)
Triglycerides: 318 mg/dL — ABNORMAL HIGH (ref 0.0–149.0)
WBC: 6.3 10*3/uL (ref 4.5–10.5)

## 2010-10-17 NOTE — Assessment & Plan Note (Signed)
Summary: gallstones/eval for ERCP with Spyglass/sheri    History of Present Illness Visit Type: Follow-up Visit Primary GI MD: Stan Head MD [employer] Primary Provider: [facility] Chief Complaint: Evaluation of gallstones with spyglass History of Present Illness:   [name] is a pleasant 77 year old white female referred at the request of Dr. Leone Payor for treatment of CBD stones.  These were diagnosed at ERCP in September, 2010.  A bile duct stone was removed and a stent was placed.  At repeat ERCP in November, 2010 there were numerous retained bile duct stones.  A plastic stent was reinserted.  Since that time she has felt well has had no complaints including pain or fevers.  The patient has a nonischemic cardiomyopathy and is status post radical ICD   GI Review of Systems    Reports abdominal pain.     Location of  Abdominal pain: right side.    Denies acid reflux, belching, bloating, chest pain, dysphagia with liquids, dysphagia with solids, heartburn, loss of appetite, nausea, vomiting, vomiting blood, weight loss, and  weight gain.      Reports diarrhea.     Denies anal fissure, black tarry stools, change in bowel habit, constipation, diverticulosis, fecal incontinence, heme positive stool, hemorrhoids, irritable bowel syndrome, jaundice, light color stool, liver problems, rectal bleeding, and  rectal pain.    Current Medications (verified): 1)  Altace 10 Mg Caps (Ramipril) .... One By Mouth Daily 2)  Bd Ultra-Fine Lancets  Misc (Lancets) .... Use To Check Glucose Once Daily and in The Evening 3)  Coreg 12.5 Mg Tabs (Carvedilol) .Marland Kitchen.. 1 and 1/2 Tab By Mouth Two Times A Day 4)  Gabapentin 300 Mg  Caps (Gabapentin) .... Two By Mouth Two Times A Day 5)  Nexium 40 Mg  Cpdr (Esomeprazole Magnesium) .... Take One By Mouth Daily 6)  Freestyle Test   Strp (Glucose Blood) .... Test Two Times A Day As Directed 7)  Simvastatin 20 Mg  Tabs (Simvastatin) .Marland Kitchen.. 1 By Mouth At Bedtime 8)   Furosemide 20 Mg  Tabs (Furosemide) .... Take 2 By Mouth Every Other Day 9)  Humalog Mix 75/25 Pen 75-25 % Susp (Insulin Lispro Prot & Lispro) .... 8 Units in Am and 10 Units in Pm 10)  Norvasc 5 Mg Tabs (Amlodipine Besylate) .... Take 1/2 By Mouth Two Times A Day 11)  Healthy Colon  Caps (Misc Intestinal Flora Regulat) .... Take One By Mouth Daily 12)  Vitamin D 1000 Unit Caps (Cholecalciferol) .... Take One By Mouth Daily 13)  Loperamide Hcl 2 Mg Tabs (Loperamide Hcl) .... As Needed 14)  Meloxicam 7.5 Mg Tabs (Meloxicam) .... One Tab Once or Twice Daily As Needed For Pain 15)  Dicyclomine Hcl 10 Mg Caps (Dicyclomine Hcl) .Marland Kitchen.. 1-2 By Mouth Ac Meals 16)  Pataday 0.2 % Soln (Olopatadine Hcl) .Marland Kitchen.. 1 Drop Into Each Eye Two Times A Day  Allergies (verified): 1)  ! Celebrex (Celecoxib) 2)  ! Aspirin Ec (Aspirin) 3)  ! Avelox (Moxifloxacin Hcl) 4)  ! Tricor (Fenofibrate) 5)  ! Lipitor (Atorvastatin Calcium) 6)  ! Niaspan (Niacin (Antihyperlipidemic)) 7)  ! Metformin Hcl (Metformin Hcl)  Past History:  Past Medical History: Reviewed history from 07/11/2009 and no changes required. Diabetes mellitus, type II Hypertension Coronary artery disease, pulm htn congestive heart failure, (non ish cardiomyopathy) gout Osteoarthritis, SPINE, KNEES Allergic rhinitis Hyperlipidemia, TRIG Osteopenia IBS choledocholithiasis and cholangitis  cardiol-- Tenny Craw endo- [facility]  [employer]  Past Surgical History: Reviewed history from 05/07/2009  and no changes required. 1992 CCY 1999 HYST. BLADDER TACK 1982 PUD/BLEED/TRANSFUSION 2001 CARDIAC CATH KIDNEY STONES/ CYSTO 2002 DEXA 2002 PARTIAL NEPHRECTOMY/ RENAL CELL CA 2003 COLONOS,  POLYP,HEM, DIVERTIC 2003 EGD POLYP, GASTRITIS, HH 2004 CAROTID DOPPLERS NML 2.2006 LS DEGEN. CHANGES  10/27 /08  2nd time 6.2006 HOSP DIARRHEA/ ERCP 9.2007 HOSP RENAL FAIL 07/12/07 - ICD -  Guidant Contak Renewal III 1.2008 COLONOSC POLYPS RE CK 1 YR 2008 CATARACTS  Right eye OPTHY FALL 01/1.04/3.06 MICROALB 2.02 11.06 hospitalization 8/10 nausea/vomting and chest pain with inc LFTS/ dilated bile ducts   Family History: Reviewed history from 05/25/2009 and no changes required. daughter breast ca high chol in family No FH of Colon Cancer: Family History of Esophageal Cancer: son died 2  Family History of Diabetes:  2 daughters, 1 son Family History of Heart Disease: daughter  Social History: Reviewed history from 01/29/2009 and no changes required. 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    Vital Signs:  Patient profile:   77 year old female Height:      61 inches Weight:      158.13 pounds Pulse rate:   76 / minute Pulse rhythm:   regular BP sitting:   148 / 66  (left arm) Cuff size:   regular  Vitals Entered By: June McMurray CMA Duncan Dull) (September 21, 2009 3:23 PM)  Physical Exam  Additional Exam:  She  is an elderly female in no acute distress  skin: anicteric HEENT: normocephalic; PEERLA; no nasal or pharyngeal abnormalities neck: supple nodes: no cervical lymphadenopathy chest: clear to ausculatation and percussion heart: no murmurs, gallops, or rubs abd: soft, nontender; BS normoactive; no abdominal masses, tenderness, organomegaly rectal: deferred ext: no cynanosis, clubbing, edema skeletal: no deformities neuro: oriented x 3; no focal abnormalities    Impression & Recommendations:  Problem # 1:  CALCU GALLBLADD W/ACUT CHOLCYST W/O MENTION OBST (ICD-574.00)  the patient has numerous retained bile duct stones.  Recommendations #1 ERCP with cholangioscopy and anticipated mechanical lithotripsy  The risks, benefits, and possible complications of the procedure, including bleeding,  perforation, surgery, and the 5-10% risk for pancreatitis, were explained to the patient.  Patient's questions were answered.  Orders: ERCP (ERCP)  Problem # 2:  AUTOMATIC IMPLANTABLE CARDIAC DEFIBRILLATOR SITU (ICD-V45.02) Assessment: Comment Only  Patient Instructions: 1)  Your ERCP with Propoful is scheduled for 10/03/2009 at [facility] at 12=30

## 2010-10-17 NOTE — Procedures (Signed)
Summary: ERCP  Patient: [name] Note: All result statuses are Final unless otherwise noted.  Tests: (1) ERCP (ERC)   ERC ERCP                  DONE     [facility]     [address] [place], [place]  [address]           ERCP PROCEDURE REPORT           PATIENT:  [name], [name]  MR#:  [id number]     BIRTHDATE:  [birth year]  GENDER:  female           ENDOSCOPIST:  Barbette Hair. Arlyce Dice, MD     ASSISTANT:           PROCEDURE DATE:  10/03/2009     PROCEDURE:  ERCP w/ direct viz bile duct, ERCP with balloon     dilation, ERCP with balloon passage, ERCP with lithotripsy, ERCP     with removal of stones, ERCP w/stent removal           INDICATIONS:  stone           MEDICATIONS:   MAC sedation, administered by CRNA, cipro 400 mg     TOPICAL ANESTHETIC:           DESCRIPTION OF PROCEDURE:   After the risks benefits and     alternatives of the procedure were thoroughly explained, informed     consent was obtained.  The  endoscope was introduced through the     mouth and advanced to the third portion of the duodenum.           A stent was present. Bile duct stent in proper position - removed     with polypectomy snare  Multiple stones were found. Multiple     stones (too numerous to count) throughout CBD and CHD. Largest     stones measured at least 15mm.     Sphincterotomy was extended about 5-28mm. A stricture measuring 8mm     in width was seen in the midportion of the CBD. Balloon dilitation     of the stricture with 8 and 10mm balloons was performed.     Multiple stones were extracted with the 12 and 15mm balloon stone     extractor.     Bile duct was directly visualized with the cholangiogscope.     Multiple stones in the CHD were visualized and fragmented     utilizing the electrohydraulic lithotripter. Remaining stones were     crushed with the basket lithotripter and extracted either with the     basket stone extractor or the balloon stone extractor. Final  cholangiogram revealed a diffusely dilated CBD and CHD but no     remaining stones (see image2, image6, image9, and image10).    The     scope was then completely withdrawn from the patient and the     procedure terminated.     <<PROCEDUREIMAGES>>           COMPLICATIONS:  None           ENDOSCOPIC IMPRESSION:     1) s/p removal of biliary stent     2) Multiple biliary stones - s/p successful removal with EHL,     mechanical lithotripsy, stone and basket stone extraction     RECOMMENDATIONS:     1) admit to hospital for observation  ______________________________     Barbette Hair Arlyce Dice, MD           CC:  Iva Boop, M.D., [facility], Roxy Manns, MD           n.     eSIGNED:   Barbette Hair. Jailen Lung at 10/03/2009 04:41 PM           Arnette Norris, [id number]  Note: An exclamation mark (!) indicates a result that was not dispersed into the flowsheet. Document Creation Date: 10/03/2009 4:42 PM _______________________________________________________________________  (1) Order result status: Final Collection or observation date-time: 10/03/2009 16:18 Requested date-time:  Receipt date-time:  Reported date-time:  Referring Physician:   Ordering Physician: Melvia Heaps [phone]) Specimen Source:  Source: Launa Grill Order Number: [id number] Lab site:

## 2010-10-17 NOTE — Progress Notes (Signed)
Summary: echo results   Phone Note Call from Patient Call back at Home Phone [phone]   Caller: Daughter Summary of Call: pt daughter calling to get echo results.  Initial call taken by: Edman Circle,  June 19, 2010 12:30 PM  Follow-up for Phone Call        [name]'s daughter, [name], calls today for results of ECHO.  I told her we would be happy to call her when the results were in.  She said that would be fine & just wanted to make sure she had not missed our call.  I told her I would send this to Dr. Tenny Craw as she is out of the office today. Mylo Red  RN     Appended Document: echo results Pt.s daughter aware of results of echo.  Appended Document: echo results Called patient's daughter with results.

## 2010-10-17 NOTE — Assessment & Plan Note (Signed)
Summary: 5 month rov/sl      Allergies Added:   Visit Type:  Follow-up Referring Provider:  Thompson Caul Ramchandani,MD Primary Provider:  [facility]  CC:  no cardaic concern.  History of Present Illness: Patient is a 77 year old with a history of NICM (now normal function), s/p ICD, dyslipidemia and hypertension.  I last saw her in September 2010 Since seen she has been very busy undergoing treatment for her gallstones.  In January she underwent a 4 hr procedure (ERCP) with removal of all stones. Since this procedure she has been feeling better.  She denies chest pain.  Energy is picking up. NO shortness of breath.  Current Medications (verified): 1)  Altace 10 Mg Caps (Ramipril) .... One By Mouth Daily 2)  Bd Ultra-Fine Lancets  Misc (Lancets) .... Use To Check Glucose Once Daily and in The Evening 3)  Coreg 12.5 Mg Tabs (Carvedilol) .Marland Kitchen.. 1 and 1/2 Tab By Mouth Two Times A Day 4)  Gabapentin 300 Mg  Caps (Gabapentin) .... Two By Mouth Two Times A Day 5)  Nexium 40 Mg  Cpdr (Esomeprazole Magnesium) .... Take One By Mouth Daily 6)  Freestyle Test   Strp (Glucose Blood) .... Test Two Times A Day As Directed 7)  Simvastatin 20 Mg  Tabs (Simvastatin) .Marland Kitchen.. 1 By Mouth At Bedtime 8)  Furosemide 20 Mg  Tabs (Furosemide) .... Take 2 By Mouth Every Other Day 9)  Humalog Mix 75/25 Pen 75-25 % Susp (Insulin Lispro Prot & Lispro) .... 8 Units in Am and 10 Units in Pm 10)  Norvasc 5 Mg Tabs (Amlodipine Besylate) .... Take 1/2 By Mouth Two Times A Day 11)  Healthy Colon  Caps (Misc Intestinal Flora Regulat) .... Take One By Mouth Daily 12)  Vitamin D 1000 Unit Caps (Cholecalciferol) .... Take One By Mouth Daily 13)  Loperamide Hcl 2 Mg Tabs (Loperamide Hcl) .... As Needed 14)  Meloxicam 7.5 Mg Tabs (Meloxicam) .... One Tab Once or Twice Daily As Needed For Pain 15)  Dicyclomine Hcl 10 Mg Caps (Dicyclomine Hcl) .Marland Kitchen.. 1-2 By Mouth Ac Meals 16)  Pataday 0.2 % Soln (Olopatadine Hcl) .Marland Kitchen.. 1 Drop Into Each  Eye Two Times A Day  Allergies (verified): 1)  ! Celebrex (Celecoxib) 2)  ! Aspirin Ec (Aspirin) 3)  ! Avelox (Moxifloxacin Hcl) 4)  ! Tricor (Fenofibrate) 5)  ! Lipitor (Atorvastatin Calcium) 6)  ! Niaspan (Niacin (Antihyperlipidemic)) 7)  ! Metformin Hcl (Metformin Hcl)  Past History:  Past Medical History: Last updated: 07/11/2009 Diabetes mellitus, type II Hypertension Coronary artery disease, pulm htn congestive heart failure, (non ish cardiomyopathy) gout Osteoarthritis, SPINE, KNEES Allergic rhinitis Hyperlipidemia, TRIG Osteopenia IBS choledocholithiasis and cholangitis  cardiol-- Tenny Craw endo- [facility]  [employer]  Past Surgical History: Last updated: 05/07/2009 1992 CCY 1999 HYST. BLADDER TACK 1982 PUD/BLEED/TRANSFUSION 2001 CARDIAC CATH KIDNEY STONES/ CYSTO 2002 DEXA 2002 PARTIAL NEPHRECTOMY/ RENAL CELL CA 2003 COLONOS,  POLYP,HEM, DIVERTIC 2003 EGD POLYP, GASTRITIS, HH 2004 CAROTID DOPPLERS NML 2.2006 LS DEGEN. CHANGES  10/27 /08  2nd time 6.2006 HOSP DIARRHEA/ ERCP 9.2007 HOSP RENAL FAIL 07/12/07 - ICD -  Guidant Contak Renewal III 1.2008 COLONOSC POLYPS RE CK 1 YR 2008 CATARACTS Right eye OPTHY FALL 01/1.04/3.06 MICROALB 2.02 11.06 hospitalization 8/10 nausea/vomting and chest pain with inc LFTS/ dilated bile ducts   Family History: Last updated: 05/25/2009 daughter breast ca high chol in family No FH of Colon Cancer: Family History of Esophageal Cancer: son died 30  Family History  of Diabetes:  2 daughters, 1 son Family History of Heart Disease: daughter  Social History: Last updated: 01/29/2009 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  Negative to the above problem except as noted.  Vital Signs:  Patient profile:   77 year old female Height:      61 inches Pulse rate:   72 / minute BP sitting:   157 / 65   (left arm) Cuff size:   regular  Vitals Entered By: Burnett Kanaris, CNA (October 22, 2009 2:32 PM)   EKG  Procedure date:  10/22/2009  Findings:      AV paced.   ICD Specifications ICD Vendor:  [employer]     ICD Model Number:  H175     ICD Serial Number:  [id number] ICD DOI:  07/12/2007     ICD Implanting MD:  Sherryl Manges, MD  Lead 1:    Location: RA     DOI: 03/30/2002     Model #: [id number]     Serial #: [id number]     Status: active Lead 2:    Location: RV     DOI: 03/30/2002     Model #: [id number]     Serial #: [id number]     Status: active Lead 3:    Location: LV     DOI: 09/27/2003     Model #: [id number]     Serial #: [id number]     Status: active  Indications::  NICM  Explantation Comments: Latitude  Impression & Recommendations:  Problem # 1:  CORONARY ARTERY DISEASE (ICD-414.00) No signs of angina.  Would continue on current regimen.  Problem # 2:  SYSTOLIC HEART FAILURE, CHRONIC (ICD-428.22) NOrmalization of LV function by echo. Her updated medication list for this problem includes:    Altace 10 Mg Caps (Ramipril) ..... One by mouth daily    Coreg 12.5 Mg Tabs (Carvedilol) .Marland Kitchen... 1 and 1/2 tab by mouth two times a day    Furosemide 20 Mg Tabs (Furosemide) .Marland Kitchen... Take 2 by mouth every other day    Norvasc 5 Mg Tabs (Amlodipine besylate) .Marland Kitchen... Take 1/2 by mouth two times a day  Problem # 3:  HYPERLIPIDEMIA (ICD-272.4) Continue. Her updated medication list for this problem includes:    Simvastatin 20 Mg Tabs (Simvastatin) .Marland Kitchen... 1 by mouth at bedtime  Other Orders: EKG w/ Interpretation (93000) TLB-Lipid Panel (80061-LIPID)  Patient Instructions: 1)  Your physician recommends that you return for lab work in: Non Fasting Lipid panel today...we will call you with results 2)  Your physician recommends that you schedule a follow-up appointment in: Early Sept.2011

## 2010-10-17 NOTE — Letter (Signed)
Summary: ERCP/PROPOFUL/SPYGLASS  [facility]  [address] [place], [place] [address]   Phone: [phone]  Fax: [phone]       ASAL TEAS    1924/02/27    MRN: [id number]       Procedure Day /Date:WEDNESDAY 10/03/2009     Arrival Time: 10:30AM     Procedure Time:12:30PM    Location of Procedure:                     [facility] ( Outpatient Registration)   PREPARATION FOR ERCP/PROPOFUL/SPY GLASS   NOTHING TO EAT OR DRINK AFTER MIDNIGHT        OTHER INSTRUCTIONS SEE DIABETIC INSTRUCTIONS:   You will need a responsible adult at least 75 years of age to accompany you and drive you home.   This person must remain in the waiting room during your procedure.  Wear loose fitting clothing that is easily removed.  Leave jewelry and other valuables at home.  However, you may wish to bring a book to read or an iPod/MP3 player to listen to music as you wait for your procedure to start.  Remove all body piercing jewelry and leave at home.  Total time from sign-in until discharge is approximately 2-3 hours.  You should go home directly after your procedure and rest.  You can resume normal activities the day after your procedure.  The day of your procedure you should not:   Drive   Make legal decisions   Operate machinery   Drink alcohol   Return to work  You will receive specific instructions about eating, activities and medications before you leave.    The above instructions have been reviewed and explained to me by   _______________________    I fully understand and can verbalize these instructions _____________________________ Date _________

## 2010-10-17 NOTE — Letter (Signed)
Summary: [facility]  [facility]   Imported By: Lanelle Bal 04/05/2010 11:42:34  _____________________________________________________________________  External Attachment:    Type:   Image     Comment:   External Document

## 2010-10-17 NOTE — Letter (Signed)
Summary: Diabetic Instructions  [facility]  [address] [place], [place] [address]   Phone: [phone]  Fax: [phone]    [name] Mar 22, 1924 MRN: [id number]   _  _   ORAL DIABETIC MEDICATION INSTRUCTIONS  The day before your procedure:   Take your diabetic pill as you do normally  The day of your procedure:   Do not take your diabetic pill    We will check your blood sugar levels during the admission process and again in Recovery before discharging you home  ________________________________________________________________________  _  _   INSULIN (LONG ACTING) MEDICATION INSTRUCTIONS (Lantus, NPH, 70/30, Humulin, Novolin-N)   The day before your procedure:   Take  your regular evening dose    The day of your procedure:   Do not take your morning dose    X   INSULIN (SHORT ACTING) MEDICATION INSTRUCTIONS (Regular, Humulog, Novolog)   The day before your procedure:   Do not take your evening dose   The day of your procedure:   Do not take your morning dose   _  _   INSULIN PUMP MEDICATION INSTRUCTIONS  We will contact the physician managing your diabetic care for written dosage instructions for the day before your procedure and the day of your procedure.  Once we have received the instructions, we will contact you.

## 2010-10-17 NOTE — Miscellaneous (Signed)
Summary: Orders Update  Clinical Lists Changes  Orders: Added new Test order of T-TSH ([id number]) - Signed 

## 2010-10-17 NOTE — Assessment & Plan Note (Signed)
Summary: Post ERCP    History of Present Illness Visit Type: Follow-up Visit Primary GI MD: Stan Head MD [facility] Primary Provider: Roxy Manns, MD Chief Complaint: CBD stones and IBS History of Present Illness:   77 year old white woman that has been treated for severe choledocholithiasis with several ERCPs. She had a clearing ERCP by Dr. Oscar La in January, 2011. Spyglass cholangioscopy was performed also. She feels well without any abdominal pain, fevers or jaundice at this time. She is still using dicyclomine with every meal per her daughter. That was started in the setting of the choledocholithiasis, though I have suspected IBS as a cause. All other GI ROS negative.            Current Medications (verified): 1)  Altace 10 Mg Caps (Ramipril) .... One By Mouth Daily 2)  Bd Ultra-Fine Lancets  Misc (Lancets) .... Use To Check Glucose Once Daily and in The Evening 3)  Coreg 12.5 Mg Tabs (Carvedilol) .Marland Kitchen.. 1 and 1/2 Tab By Mouth Two Times A Day 4)  Gabapentin 300 Mg  Caps (Gabapentin) .... Two By Mouth Two Times A Day 5)  Nexium 40 Mg  Cpdr (Esomeprazole Magnesium) .... Take One By Mouth Daily 6)  Freestyle Test   Strp (Glucose Blood) .... Test Two Times A Day As Directed 7)  Simvastatin 20 Mg  Tabs (Simvastatin) .Marland Kitchen.. 1 By Mouth At Bedtime 8)  Furosemide 20 Mg  Tabs (Furosemide) .... Take 2 By Mouth Every Other Day 9)  Humalog Mix 75/25 Pen 75-25 % Susp (Insulin Lispro Prot & Lispro) .... 8 Units in Am and 10 Units in Pm 10)  Norvasc 5 Mg Tabs (Amlodipine Besylate) .... Take 1/2 By Mouth Two Times A Day 11)  Healthy Colon  Caps (Misc Intestinal Flora Regulat) .... Take One By Mouth Daily 12)  Vitamin D 1000 Unit Caps (Cholecalciferol) .... Take One By Mouth Daily 13)  Loperamide Hcl 2 Mg Tabs (Loperamide Hcl) .... As Needed 14)  Meloxicam 7.5 Mg Tabs (Meloxicam) .... One Tab Once or Twice Daily As Needed For Pain 15)  Dicyclomine Hcl 10 Mg Caps (Dicyclomine Hcl) .Marland Kitchen.. 1-2 By Mouth Ac  Meals 16)  Pataday 0.2 % Soln (Olopatadine Hcl) .Marland Kitchen.. 1 Drop Into Each Eye Two Times A Day  Allergies (verified): 1)  ! Celebrex (Celecoxib) 2)  ! Aspirin Ec (Aspirin) 3)  ! Avelox (Moxifloxacin Hcl) 4)  ! Tricor (Fenofibrate) 5)  ! Lipitor (Atorvastatin Calcium) 6)  ! Niaspan (Niacin (Antihyperlipidemic)) 7)  ! Metformin Hcl (Metformin Hcl)  Past History:  Past Medical History: Reviewed history from 07/11/2009 and no changes required. Diabetes mellitus, type II Hypertension Coronary artery disease, pulm htn congestive heart failure, (non ish cardiomyopathy) gout Osteoarthritis, SPINE, KNEES Allergic rhinitis Hyperlipidemia, TRIG Osteopenia IBS choledocholithiasis and cholangitis  cardiol-- Tenny Craw endo- [facility]  [employer]  Past Surgical History: 1992 CCY 1999 HYST. BLADDER TACK 1982 PUD/BLEED/TRANSFUSION 2001 CARDIAC CATH KIDNEY STONES/ CYSTO 2002 DEXA 2002 PARTIAL NEPHRECTOMY/ RENAL CELL CA 2003 COLONOS,  POLYP,HEM, DIVERTIC 2003 EGD POLYP, GASTRITIS, HH 2004 CAROTID DOPPLERS NML 2.2006 LS DEGEN. CHANGES  10/27 /08  2nd time 6.2006 HOSP DIARRHEA/ ERCP 9.2007 HOSP RENAL FAIL 07/12/07 - ICD -  Guidant Contak Renewal III 1.2008 COLONOSC POLYPS RE CK 1 YR 2008 CATARACTS Right eye OPTHY FALL 01/1.04/3.06 MICROALB 2.02 11.06 ERCP's and clearing of choledocholithiasis 2010-2011  Family History: Reviewed history from 05/25/2009 and no changes required. daughter breast ca high chol in family No FH of Colon Cancer: Family History  of Esophageal Cancer: son died 63  Family History of Diabetes:  2 daughters, 1 son Family History of Heart Disease: daughter  Social History: Reviewed history from 01/29/2009 and no changes required. 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   Review of Systems       no chest pain or dyspnea see HPI also  Vital Signs:  Patient profile:   77 year old  female Height:      61 inches Weight:      154.38 pounds Pulse rate:   72 / minute Pulse rhythm:   regular BP sitting:   148 / 76  (left arm)  Vitals Entered By: Milford Cage NCMA (November 05, 2009 10:46 AM)  Physical Exam  General:  elderly and well appearing Eyes:  anicteric Neurologic:  a&o Psych:  normal affect, talkative and pleasant    Impression & Recommendations:  Problem # 1:  CALCU BD WITHOUT MENTION CHOLECYST W/OBSTRUCTION (ICD-574.51) Assessment Improved It appears that CBD stones are cleared. Radiologist ?ed some residual stones after last ERCP but Dr. Arlyce Dice indicated duct was clear. Sphincterotomy was enlarged and would think stones remaining would pass if any left so no futrther imaging. Will recheck labs and plan as needed follow-up. Orders: TLB-Hepatic/Liver Function Pnl (80076-HEPATIC)  Problem # 2:  IRRITABLE BOWEL SYNDROME (ICD-564.1) Assessment: Improved Post-pandial lower abdominal pain was probably this though some ? if CD stones were related. Will continue dicyclomine has been using daily with meals but can try as needed if desired daughter administers meds and understands  Patient Instructions: 1)  Your physician has requested that you have the following labwork done today: Liver chemistries 2)  Please go to the basement to have your lab tests drawn today. 3)  You may try using the dicyclomine as needed instead of with every meal but itis ok to use all the time if required to control pain. 4)  Please schedule a follow-up appointment as needed.  5)  Copy sent to : Roxy Manns, MD 6)  The medication list was reviewed and reconciled.  All changed / newly prescribed medications were explained.  A complete medication list was provided to the patient / caregiver.

## 2010-10-17 NOTE — Letter (Signed)
Summary: Remote Device Check  [employer], Main Office  [address]   [place], [place] [address]   Phone: [phone]  Fax: [phone]     August 29, 2010 MRN: [id number]   TIFFAY PINETTE [address] [place], [place]  [address]   Dear Ms. Mack Guise,   Your remote transmission was recieved and reviewed by your physician.  All diagnostics were within normal limits for you.  _____Your next transmission is scheduled for:                       .  Please transmit at any time this day.  If you have a wireless device your transmission will be sent automatically.  _X_____Your next office visit is scheduled for: 11/05/2010 @ 11:40am with Dr. Graciela Husbands.    Sincerely,  Altha Harm, LPN

## 2010-10-17 NOTE — Assessment & Plan Note (Signed)
Summary: FOLLOW UP / LFW R/S 12/6   Vital Signs:  Patient profile:   77 year old female Height:      61 inches Weight:      158.25 pounds BMI:     30.01 Temp:     97.7 degrees F oral Pulse rate:   76 / minute Pulse rhythm:   regular BP sitting:   158 / 72  (left arm) Cuff size:   regular  Vitals Entered By: Lewanda Rife LPN (August 26, 2010 12:08 PM)  Serial Vital Signs/Assessments:  Time      Position  BP       Pulse  Resp  Temp     By                     140/66                         Judith Part MD  CC: six month f/u, Lipid Management   History of Present Illness: here for f/u of HTN and lipids is feeling good with no complaints   is excited to see family for the holidays   wt is down 2 lb with bmi of 30  HTN -- first bp today is 158/72- high for her  she was told by Dr Tenny Craw is running good -- gets lower after lunch (cardiologist watches her )   DM is cared for by Dr Talmage Nap  is on insulin and sugars are very well controlled   lipids good in june on med and diet with LDL 53 due to check that  is eating very healthy diet  is working on wt loss with her diet  she walks with her walker (supervised) to get her exercise)  tries to remain very independant  inc lft in past from gallstones   flu shot and pneumovax are up to date     Lipid Management History:      Positive NCEP/ATP III risk factors include female age 85 years old or older, diabetes, HDL cholesterol less than 40, hypertension, ASHD (either angina/prior MI/prior CABG), and peripheral vascular disease.  Negative NCEP/ATP III risk factors include non-tobacco-user status.    Allergies: 1)  ! Celebrex (Celecoxib) 2)  ! Aspirin Ec (Aspirin) 3)  ! Avelox (Moxifloxacin Hcl) 4)  ! Tricor (Fenofibrate) 5)  ! Lipitor (Atorvastatin Calcium) 6)  ! Niaspan (Niacin (Antihyperlipidemic)) 7)  ! Metformin Hcl (Metformin Hcl)  Past History:  Past Medical History: Last updated: 07/11/2009 Diabetes  mellitus, type II Hypertension Coronary artery disease, pulm htn congestive heart failure, (non ish cardiomyopathy) gout Osteoarthritis, SPINE, KNEES Allergic rhinitis Hyperlipidemia, TRIG Osteopenia IBS choledocholithiasis and cholangitis  cardiol-- Tenny Craw endo- [place]  [employer]  Past Surgical History: Last updated: 11/05/2009 1992 CCY 1999 HYST. BLADDER TACK 1982 PUD/BLEED/TRANSFUSION 2001 CARDIAC CATH KIDNEY STONES/ CYSTO 2002 DEXA 2002 PARTIAL NEPHRECTOMY/ RENAL CELL CA 2003 COLONOS,  POLYP,HEM, DIVERTIC 2003 EGD POLYP, GASTRITIS, HH 2004 CAROTID DOPPLERS NML 2.2006 LS DEGEN. CHANGES  10/27 /08  2nd time 6.2006 HOSP DIARRHEA/ ERCP 9.2007 HOSP RENAL FAIL 07/12/07 - ICD -  Guidant Contak Renewal III 1.2008 COLONOSC POLYPS RE CK 1 YR 2008 CATARACTS Right eye OPTHY FALL 01/1.04/3.06 MICROALB 2.02 11.06 ERCP's and clearing of choledocholithiasis 2010-2011  Family History: Last updated: 05/25/2009 daughter breast ca high chol in family No FH of Colon Cancer: Family History of Esophageal Cancer: son died 68  Family History of Diabetes:  2 daughters, 1 son Family History of Heart Disease: daughter  Social History: Last updated: 01/29/2009 non smoker no alcohol lives with daughter who cares for her  Occupation:  retired Illicit Drug Use - no Widowed   Risk Factors: Smoking Status: never (12/28/2006)  Review of Systems General:  Denies chills, fatigue, and loss of appetite. Eyes:  Denies blurring. CV:  Denies chest pain or discomfort, lightheadness, and palpitations. Resp:  Denies cough, pleuritic, and shortness of breath. GI:  Denies abdominal pain, bloody stools, change in bowel habits, and indigestion. MS:  Complains of stiffness; denies loss of strength. Derm:  Denies itching, lesion(s), poor wound healing, and rash. Neuro:  Denies headaches, tingling, and tremors. Psych:  Denies anxiety and depression. Endo:  Denies cold intolerance, excessive  thirst, excessive urination, and heat intolerance. Heme:  Denies abnormal bruising and bleeding.  Physical Exam  General:  overwt elderly female in no distress  Head:  normocephalic, atraumatic, and no abnormalities observed.   Eyes:  vision grossly intact, pupils equal, pupils round, and pupils reactive to light.  no conjunctival pallor, injection or icterus  Mouth:  pharynx pink and moist.   Neck:  supple with full rom and no masses or thyromegally, no JVD or carotid bruit  Chest Wall:  No deformities, masses, or tenderness noted. Lungs:  Normal respiratory effort, chest expands symmetrically. Lungs are clear to auscultation, no crackles or wheezes. Heart:  Normal rate and regular rhythm. S1 and S2 normal without gallop, murmur, click, rub or other extra sounds. Abdomen:  Bowel sounds positive,abdomen soft and non-tender without masses, organomegaly or hernias noted. no renal bruits  Msk:  No deformity or scoliosis noted of thoracic or lumbar spine.  changes of OA noted  Pulses:  plus one pedal pulses  Extremities:  No clubbing, cyanosis, edema, or deformity noted with normal full range of motion of all joints.   feet well cared for with good nail care Neurologic:  sensation intact to light touch, gait normal, and DTRs symmetrical and normal.   Skin:  Intact without suspicious lesions or rashes Cervical Nodes:  No lymphadenopathy noted Psych:  normal affect, talkative and pleasant  talkative   Diabetes Management Exam:    Foot Exam (with socks and/or shoes not present):       Sensory-Pinprick/Light touch:          Left medial foot (L-4): normal          Left dorsal foot (L-5): normal          Left lateral foot (S-1): normal          Right medial foot (L-4): normal          Right dorsal foot (L-5): normal          Right lateral foot (S-1): normal       Sensory-Monofilament:          Left foot: normal          Right foot: normal       Inspection:          Left foot: normal           Right foot: normal       Nails:          Left foot: normal          Right foot: normal   Impression & Recommendations:  Problem # 1:  HYPERLIPIDEMIA (ICD-272.4) Assessment Unchanged  re check today on statin and diet  no c/o of  myalgias - will watch for that  rev low sat fat diet  f/u 6 mo Her updated medication list for this problem includes:    Simvastatin 20 Mg Tabs (Simvastatin) .Marland Kitchen... 1 by mouth at bedtime  Labs Reviewed: SGOT: 14 (02/18/2010)   SGPT: 10 (02/18/2010)   HDL:33.30 (02/18/2010), 38.30 (10/22/2009)  LDL:DEL (08/07/2008), DEL (11/05/2007)  Chol:156 (02/18/2010), 163 (10/22/2009)  Trig:442.0 (02/18/2010), 318.0 (10/22/2009)  Orders: Venipuncture (04540) TLB-Lipid Panel (80061-LIPID) TLB-ALT (SGPT) (84460-ALT) TLB-AST (SGOT) (84450-SGOT) TLB-Renal Function Panel (80069-RENAL)  Problem # 2:  HYPERTENSION (ICD-401.9) Assessment: Unchanged  this is better on second check today  no change in med notes from cardiol rev is good with low sodium diet  renal prof today plan lab and f/u in 6 mo for check up Her updated medication list for this problem includes:    Altace 10 Mg Caps (Ramipril) ..... One by mouth daily    Coreg 12.5 Mg Tabs (Carvedilol) .Marland Kitchen... 1 and 1/2 tab by mouth two times a day    Furosemide 20 Mg Tabs (Furosemide) .Marland Kitchen... Take 2 by mouth every other day    Norvasc 5 Mg Tabs (Amlodipine besylate) .Marland Kitchen... Take 1/2 by mouth two times a day  BP today: 140/66 Prior BP: 164/66 (05/24/2010)  Labs Reviewed: K+: 4.9 (05/24/2010) Creat: : 1.15 (05/24/2010)   Chol: 156 (02/18/2010)   HDL: 33.30 (02/18/2010)   LDL: DEL (08/07/2008)   TG: 442.0 (02/18/2010)  Orders: Venipuncture (98119) TLB-Lipid Panel (80061-LIPID) TLB-ALT (SGPT) (84460-ALT) TLB-AST (SGOT) (84450-SGOT) TLB-Renal Function Panel (80069-RENAL) Prescription Created Electronically [phone])  Problem # 3:  DIABETES MELLITUS, TYPE II (ICD-250.00) Assessment: Comment Only under care of Dr  Talmage Nap doing well with insulin nl foot exam today Her updated medication list for this problem includes:    Altace 10 Mg Caps (Ramipril) ..... One by mouth daily    Humalog Mix 75/25 Pen 75-25 % Susp (Insulin lispro prot & lispro) .Marland Kitchen... 8 units in am and 8 units in pm  Complete Medication List: 1)  Altace 10 Mg Caps (Ramipril) .... One by mouth daily 2)  Bd Ultra-fine Lancets Misc (Lancets) .... Use to check glucose once daily and in the evening 3)  Coreg 12.5 Mg Tabs (Carvedilol) .Marland Kitchen.. 1 and 1/2 tab by mouth two times a day 4)  Gabapentin 300 Mg Caps (Gabapentin) .... Two by mouth two times a day 5)  Nexium 40 Mg Cpdr (Esomeprazole magnesium) .... Take one by mouth daily 6)  Freestyle Test Strp (Glucose blood) .... Test two times a day as directed 7)  Simvastatin 20 Mg Tabs (Simvastatin) .Marland Kitchen.. 1 by mouth at bedtime 8)  Furosemide 20 Mg Tabs (Furosemide) .... Take 2 by mouth every other day 9)  Humalog Mix 75/25 Pen 75-25 % Susp (Insulin lispro prot & lispro) .... 8 units in am and 8 units in pm 10)  Norvasc 5 Mg Tabs (Amlodipine besylate) .... Take 1/2 by mouth two times a day 11)  Healthy Colon Caps (Misc intestinal flora regulat) .... Take one by mouth daily 12)  Vitamin D3 1000 Unit Caps (Cholecalciferol) .Marland Kitchen.. 1 by mouth daily 13)  Loperamide Hcl 2 Mg Tabs (Loperamide hcl) .... As needed 14)  Dicyclomine Hcl 10 Mg Caps (Dicyclomine hcl) .Marland Kitchen.. 1-2 by mouth ac meals 15)  Pataday 0.2 % Soln (Olopatadine hcl) .Marland Kitchen.. 1 drop into each eye two times a day 16)  Tylenol 325 Mg Tabs (Acetaminophen) .... Otc as directed.  Lipid Assessment/Plan:      Based on NCEP/ATP III,  the patient's risk factor category is "history of coronary disease, peripheral vascular disease, cerebrovascular disease, or aortic aneurysm along with either diabetes, current smoker, or LDL > 130 plus HDL < 40 plus triglycerides > 200".  The patient's lipid goals are as follows: Total cholesterol goal is 200; LDL cholesterol goal is  70; HDL cholesterol goal is 40; Triglyceride goal is 150.     Patient Instructions: 1)  schedule fasting labs and then check up in 6 months 2)   lipids / hepatic/ renal/ cbc with diff/ tsh/ vit D level 272 and 250.0 and 401.1 and 733.0 3)  blood pressure is better on second check today  4)  try to stay as active as possible  5)  medicines were sent to the pharmacy today Prescriptions: SIMVASTATIN 20 MG  TABS (SIMVASTATIN) 1 by mouth at bedtime  #30 x 11   Entered and Authorized by:   Judith Part MD   Signed by:   Judith Part MD on 08/26/2010   Method used:   Electronically to        [facility]. [address] [phone]* (retail)       [address] [place], [place]  [id number]       Ph: [phone]       Fax: [phone]   RxID:   [id number] DICYCLOMINE HCL 10 MG CAPS (DICYCLOMINE HCL) 1-2 by mouth ac meals  #90 x 3   Entered and Authorized by:   Judith Part MD   Signed by:   Judith Part MD on 08/26/2010   Method used:   Electronically to        [facility]. [address] [phone]* (retail)       [address] [place], [place]  [id number]       Ph: [phone]       Fax: [phone]   RxID:   [id number]    Orders Added: 1)  Venipuncture [09326] 2)  TLB-Lipid Panel [80061-LIPID] 3)  TLB-ALT (SGPT) [84460-ALT] 4)  TLB-AST (SGOT) [84450-SGOT] 5)  TLB-Renal Function Panel [80069-RENAL] 6)  Prescription Created Electronically [G8553] 7)  Est. Patient Level III [71245]    Current Allergies (reviewed today): ! CELEBREX (CELECOXIB) ! ASPIRIN EC (ASPIRIN) ! AVELOX (MOXIFLOXACIN HCL) ! TRICOR (FENOFIBRATE) ! LIPITOR (ATORVASTATIN CALCIUM) ! NIASPAN (NIACIN (ANTIHYPERLIPIDEMIC)) ! METFORMIN HCL (METFORMIN HCL)

## 2010-10-17 NOTE — Progress Notes (Signed)
   Phone Note From Other Clinic   Caller: Debra Call For: Dr. Tenny Craw Summary of Call: Received a call from Dr. Nita Sells office, pt needs a Cardiac clearance for a procedure scheduled for Wed. ERCP for gallstones, using Propofol for sedation. Quay Burow that Dr. Tenny Craw is in the hospital today and I may or may not hear back from her. She understands. Initial call taken by: Duncan Dull, RN, BSN,  October 01, 2009 8:36 AM  Follow-up for Phone Call        I saw patient in Sept.  From cardiac standpt at that time she was doing ok. Hx of CAD, cardiomyopathy.  Has ICD.   Based on her status at that visit she was at relatively low risk for ERCP.  I have not seen since to assess.  NOte that she does have icd.  Depending on procedural plans may need to be turned off. Follow-up by: Sherrill Raring, MD, [facility],  October 01, 2009 1:16 PM     Appended Document: **Dr. Tenny Craw** Letter faxed to Dr. Marzetta Board office, attn Stanton Kidney

## 2010-10-17 NOTE — Cardiovascular Report (Signed)
Summary: Office Visit Remote   Office Visit Remote   Imported By: Roderic Ovens 08/30/2010 09:39:08  _____________________________________________________________________  External Attachment:    Type:   Image     Comment:   External Document

## 2010-10-17 NOTE — Progress Notes (Signed)
Summary: procedure   Phone Note Call from Patient Call back at Home Phone [phone]   Caller: [name]- daughter Summary of Call: Patients daughter said she would rather have the procedure done at [facility] to break up the gallstones.They have decided theydo not really want to go to [facility] to have the procedure done.  Initial call taken by: Harlow Mares CMA Duncan Dull),  September 17, 2009 10:04 AM  Follow-up for Phone Call        Dr Leone Payor how do we need to arrange and what? I did review your phone note from 09-05-09.  Let me know what needs to be done. Follow-up by: Darcey Nora RN, CGRN,  September 17, 2009 10:21 AM  Additional Follow-up for Phone Call Additional follow up Details #1::        she will need an office visit with Dr. Arlyce Dice (I think - we can check with him)  To meet him and arrange for ERCP with Spyglass cholangioscopy and contact lithotripsy Additional Follow-up by: Iva Boop MD, Clementeen Graham,  September 17, 2009 4:04 PM    Additional Follow-up for Phone Call Additional follow up Details #2::    Dr Arlyce Dice would you like to see this patient in the office or is it ok to schedule her a direct ERCP with spyglass.  I believe Dr Leone Payor has spoke to you about this patient.  Please advise Follow-up by: Darcey Nora RN, CGRN,  September 18, 2009 8:44 AM  Additional Follow-up for Phone Call Additional follow up Details #3:: Details for Additional Follow-up Action Taken: please schedule OV this week Additional Follow-up by: Louis Meckel MD,  September 18, 2009 9:58 AM  I have scheduled her for 09-21-09 9:30 with Dr Arlyce Dice.  I have left a message for [name] to call back and confirm the appointment  Darcey Nora RN, [facility]  September 18, 2009 10:06 AM   Patient  returned call she will keep appointment with Dr Arlyce Dice on 09-21-09 Darcey Nora RN, [facility]  September 18, 2009 11:12 AM

## 2010-10-17 NOTE — Letter (Signed)
Summary: [facility]   [facility]   Imported By: Roderic Ovens 05/08/2010 11:14:33  _____________________________________________________________________  External Attachment:    Type:   Image     Comment:   External Document

## 2010-10-17 NOTE — Assessment & Plan Note (Signed)
Summary: FLU SHOT/CLE   Nurse Visit   Allergies: 1)  ! Celebrex (Celecoxib) 2)  ! Aspirin Ec (Aspirin) 3)  ! Avelox (Moxifloxacin Hcl) 4)  ! Tricor (Fenofibrate) 5)  ! Lipitor (Atorvastatin Calcium) 6)  ! Niaspan (Niacin (Antihyperlipidemic)) 7)  ! Metformin Hcl (Metformin Hcl)  Immunizations Administered:  Influenza Vaccine # 1:    Vaccine Type: Fluvax 3+    Site: left deltoid    Mfr: GlaxoSmithKline    Dose: 0.5 ml    Route: IM    Given by: Mervin Hack CMA (AAMA)    Exp. Date: 03/15/2011    Lot #: [id number]    VIS given: 04/09/10 version given June 04, 2010.  Flu Vaccine Consent Questions:    Do you have a history of severe allergic reactions to this vaccine? no    Any prior history of allergic reactions to egg and/or gelatin? no    Do you have a sensitivity to the preservative Thimersol? no    Do you have a past history of Guillan-Barre Syndrome? no    Do you currently have an acute febrile illness? no    Have you ever had a severe reaction to latex? no    Vaccine information given and explained to patient? yes    Are you currently pregnant? no  Orders Added: 1)  Flu Vaccine 26yrs + [90658] 2)  Admin 1st Vaccine [25956]

## 2010-10-17 NOTE — Progress Notes (Signed)
Summary: Cardiac Clearance   Phone Note Outgoing Call   Call placed by: Laureen Ochs LPN,  October 01, 2009 8:30 AM Summary of Call: Per Claudie Revering RN/per Dr.Fortune: Pt. cannot have ERCP w/Propoful on 10-03-09 without cardiac clearance. I have spoken w/Dr.Ross office, Shawna Orleans, and she will address this w/Dr.Ross and get back with me. Claudie Revering RN is aware I am working on this. Initial call taken by: Laureen Ochs LPN,  October 01, 2009 8:35 AM  Follow-up for Phone Call        Cardiac clearance letter from Dr.Ross has been faxed to Claudie Revering RN at [facility]. dept.  Follow-up by: Laureen Ochs LPN,  October 01, 2009 3:04 PM

## 2010-10-17 NOTE — Assessment & Plan Note (Signed)
Summary: 7 mo f/u  Medications Added VITAMIN D3 1000 UNIT CAPS (CHOLECALCIFEROL) 1 by mouth daily ACETAMINOPHEN 325 MG  TABS (ACETAMINOPHEN) as needed      Allergies Added:    Referring Provider:  Hildred Priest Primary Provider:  Roxy Manns, MD   History of Present Illness: Patient is a 77 year old with a history of NICM (now normal function), s/p ICD, dyslipidemia and hypertension.  I last saw her in the spring. SInce seen she fell two wks ago.  Getting up from bed in the middle of the night.  Does not remember.  Found on floor. The patient notes incerased SOB, feeling weak,  sleeping more.  APpetite is decreased.  No chest pains.  Current Medications (verified): 1)  Altace 10 Mg Caps (Ramipril) .... One By Mouth Daily 2)  Bd Ultra-Fine Lancets  Misc (Lancets) .... Use To Check Glucose Once Daily and in The Evening 3)  Coreg 12.5 Mg Tabs (Carvedilol) .Marland Kitchen.. 1 and 1/2 Tab By Mouth Two Times A Day 4)  Gabapentin 300 Mg  Caps (Gabapentin) .... Two By Mouth Two Times A Day 5)  Nexium 40 Mg  Cpdr (Esomeprazole Magnesium) .... Take One By Mouth Daily 6)  Freestyle Test   Strp (Glucose Blood) .... Test Two Times A Day As Directed 7)  Simvastatin 20 Mg  Tabs (Simvastatin) .Marland Kitchen.. 1 By Mouth At Bedtime 8)  Furosemide 20 Mg  Tabs (Furosemide) .... Take 2 By Mouth Every Other Day 9)  Humalog Mix 75/25 Pen 75-25 % Susp (Insulin Lispro Prot & Lispro) .... 8 Units in Am and 8 Units in Pm 10)  Norvasc 5 Mg Tabs (Amlodipine Besylate) .... Take 1/2 By Mouth Two Times A Day 11)  Healthy Colon  Caps (Misc Intestinal Flora Regulat) .... Take One By Mouth Daily 12)  Vitamin D3 1000 Unit Caps (Cholecalciferol) .Marland Kitchen.. 1 By Mouth Daily 13)  Loperamide Hcl 2 Mg Tabs (Loperamide Hcl) .... As Needed 14)  Dicyclomine Hcl 10 Mg Caps (Dicyclomine Hcl) .Marland Kitchen.. 1-2 By Mouth Ac Meals 15)  Pataday 0.2 % Soln (Olopatadine Hcl) .Marland Kitchen.. 1 Drop Into Each Eye Two Times A Day 16)  Acetaminophen 325 Mg  Tabs (Acetaminophen)  .... As Needed  Allergies (verified): 1)  ! Celebrex (Celecoxib) 2)  ! Aspirin Ec (Aspirin) 3)  ! Avelox (Moxifloxacin Hcl) 4)  ! Tricor (Fenofibrate) 5)  ! Lipitor (Atorvastatin Calcium) 6)  ! Niaspan (Niacin (Antihyperlipidemic)) 7)  ! Metformin Hcl (Metformin Hcl)  Past History:  Past medical, surgical, family and social histories (including risk factors) reviewed, and no changes noted (except as noted below).  Past Medical History: Reviewed history from 07/11/2009 and no changes required. Diabetes mellitus, type II Hypertension Coronary artery disease, pulm htn congestive heart failure, (non ish cardiomyopathy) gout Osteoarthritis, SPINE, KNEES Allergic rhinitis Hyperlipidemia, TRIG Osteopenia IBS choledocholithiasis and cholangitis  cardiol-- Tenny Craw endo- [facility]  [employer]  Past Surgical History: Reviewed history from 11/05/2009 and no changes required. 1992 CCY 1999 HYST. BLADDER TACK 1982 PUD/BLEED/TRANSFUSION 2001 CARDIAC CATH KIDNEY STONES/ CYSTO 2002 DEXA 2002 PARTIAL NEPHRECTOMY/ RENAL CELL CA 2003 COLONOS,  POLYP,HEM, DIVERTIC 2003 EGD POLYP, GASTRITIS, HH 2004 CAROTID DOPPLERS NML 2.2006 LS DEGEN. CHANGES  10/27 /08  2nd time 6.2006 HOSP DIARRHEA/ ERCP 9.2007 HOSP RENAL FAIL 07/12/07 - ICD -  Guidant Contak Renewal III 1.2008 COLONOSC POLYPS RE CK 1 YR 2008 CATARACTS Right eye OPTHY FALL 01/1.04/3.06 MICROALB 2.02 11.06 ERCP's and clearing of choledocholithiasis 2010-2011  Family History: Reviewed history  from 05/25/2009 and no changes required. daughter breast ca high chol in family No FH of Colon Cancer: Family History of Esophageal Cancer: son died 59  Family History of Diabetes:  2 daughters, 1 son Family History of Heart Disease: daughter  Social History: Reviewed history from 01/29/2009 and no changes required. 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 EOMI, PERRLA.  Neck: JVP is normal. No thyromegaly. No bruits.  Lungs: clear to auscultation. No rales no wheezes.  Heart: Regular rate and rhythm. Normal S1, S2. No S3.   No significant murmurs. PMI not displaced.  Abdomen:  Supple, nontender. Normal bowel sounds. No masses. No hepatomegaly.  Extremities:   Good distal pulses throughout. No lower extremity edema.  Musculoskeletal :moving all extremities.  Neuro:   alert and oriented x3.    EKG  Procedure date:  05/24/2010  Findings:      AV sequential pacer.   ICD Specifications ICD Vendor:  [employer]     ICD Model Number:  H175     ICD Serial Number:  [id number] ICD DOI:  07/12/2007     ICD Implanting MD:  Sherryl Manges, MD  Lead 1:    Location: RA     DOI: 03/30/2002     Model #: [id number]     Serial #: [id number]     Status: active Lead 2:    Location: RV     DOI: 03/30/2002     Model #: [id number]     Serial #: [id number]     Status: active Lead 3:    Location: LV     DOI: 09/27/2003     Model #: [id number]     Serial #: [id number]     Status: active  Indications::  NICM  Explantation Comments: [employer]  Impression & Recommendations:  Problem # 1:  SYSTOLIC HEART FAILURE, CHRONIC (ICD-428.22) patients volume status does not look too bad.  It is concering though with her symptoms.   I would recommend repeating an echo and also checking labs today.Marland Kitchen  No change in meds  Problem # 2:  HYPERLIPIDEMIA (ICD-272.4) continue meds. Her updated medication list for this problem includes:    Simvastatin 20 Mg Tabs (Simvastatin) .Marland Kitchen... 1 by mouth at bedtime  Other Orders: EKG w/  Interpretation (93000) T-Vitamin D (25-Hydroxy) (00938-18299) T-Basic Metabolic Panel (37169-67893) T-CBC w/Diff (81017-51025) Echocardiogram (Echo)  Patient Instructions: 1)  Your physician recommends that you schedule a follow-up appointment in: We will call with F/U appointment. 2)  Your physician recommends that you return for lab work EN:IDPOE BMET CBC, TSH Vitamin D. Please fax results to [phone]. 3)  Your physician has requested that you have an echocardiogram.  Echocardiography is a painless test that uses sound waves to create images of your heart. It provides your doctor with information about the size and shape of your heart and how well your heart's chambers  and valves are working.  This procedure takes approximately one hour. There are no restrictions for this procedure.

## 2010-10-17 NOTE — Cardiovascular Report (Signed)
Summary: Office Visit   Office Visit   Imported By: Roderic Ovens 11/07/2009 16:31:48  _____________________________________________________________________  External Attachment:    Type:   Image     Comment:   External Document

## 2010-10-17 NOTE — Cardiovascular Report (Signed)
Summary: Office Visit Remote   Office Visit Remote   Imported By: Roderic Ovens 05/07/2010 15:35:07  _____________________________________________________________________  External Attachment:    Type:   Image     Comment:   External Document

## 2010-10-17 NOTE — Letter (Signed)
Summary: [facility]  [facility]   Imported By: Maryln Gottron 05/03/2010 09:53:15  _____________________________________________________________________  External Attachment:    Type:   Image     Comment:   External Document

## 2010-10-17 NOTE — Progress Notes (Signed)
Summary: refill request for gabapentin  Phone Note Refill Request Message from:  Fax from Pharmacy  Refills Requested: Medication #1:  GABAPENTIN 300 MG  CAPS two by mouth two times a day   Last Refilled: 12/11/2009 Faxed request from [facility] [address], [phone].  Initial call taken by: Lowella Petties CMA,  January 09, 2010 10:17 AM  Follow-up for Phone Call        px written on EMR for call in  Follow-up by: Judith Part MD,  January 09, 2010 11:55 AM  Additional Follow-up for Phone Call Additional follow up Details #1::        Medication phoned to [facility] pharmacy as instructed. Lewanda Rife LPN  January 09, 2010 12:02 PM     Prescriptions: GABAPENTIN 300 MG  CAPS (GABAPENTIN) two by mouth two times a day  #120 x 11   Entered and Authorized by:   Judith Part MD   Signed by:   Lewanda Rife LPN on 45/40/9811   Method used:   Telephoned to ...       [facility] [address] [phone]* (retail)       [address] [place], [place]  [id number]       Ph: [phone]       Fax: [phone]   RxID:   [id number]

## 2010-10-17 NOTE — Procedures (Signed)
Summary: Instructions for procedure/[facility] (out pt)  Instructions for procedure/[facility] (out pt)   Imported By: Sherian Rein 10/02/2009 09:32:11  _____________________________________________________________________  External Attachment:    Type:   Image     Comment:   External Document

## 2010-10-17 NOTE — Cardiovascular Report (Signed)
Summary: Office Visit Remote   Office Visit Remote   Imported By: Roderic Ovens 02/01/2010 15:29:20  _____________________________________________________________________  External Attachment:    Type:   Image     Comment:   External Document

## 2010-10-17 NOTE — Assessment & Plan Note (Signed)
Summary: 6 MONTH FOLLOW UP/[facility]   Vital Signs:  Patient profile:   77 year old female Height:      61 inches Weight:      157.75 pounds BMI:     29.91 Temp:     97.8 degrees F oral Pulse rate:   80 / minute Pulse rhythm:   regular BP sitting:   156 / 60  (left arm) Cuff size:   regular  Vitals Entered By: [name] CMA [name]) (February 18, 2010 9:29 AM)  Serial Vital Signs/Assessments:  Time      Position  BP       Pulse  Resp  Temp     By                     130/70                         [name]  CC: 6 months follow up   History of Present Illness: here for f/u of HTN and lipids and other chronic problems   feeling fairly good overall  overall nothing new  heat gets to her -so stays in in heat of day   sees [name] for DM [name] decreased her insulin to 8 units two times a day   LDL good in feb  bp up today first check 156/60-- took her med and it takes a while to kick in -- can check again  bp at home 120s -130 /70s usually - told to check after lunch  second check today is much better   had ercp in interum for gallstone - that went well-- feels great now   wt up 3 lb -- eating a good diet -- watches her sugars   opthy-- [name] -- in dec, all was well   Allergies: 1)  ! Celebrex (Celecoxib) 2)  ! Aspirin Ec (Aspirin) 3)  ! Avelox (Moxifloxacin Hcl) 4)  ! Tricor (Fenofibrate) 5)  ! Lipitor (Atorvastatin Calcium) 6)  ! Niaspan (Niacin (Antihyperlipidemic)) 7)  ! Metformin Hcl (Metformin Hcl)  Past History:  Past Medical History: Last updated: 07/11/2009 Diabetes mellitus, type II Hypertension Coronary artery disease, pulm htn congestive heart failure, (non ish cardiomyopathy) gout Osteoarthritis, SPINE, KNEES Allergic rhinitis Hyperlipidemia, TRIG Osteopenia IBS choledocholithiasis and cholangitis  cardiol-- [name] endo- [facility]an  [employer] [name]  Past Surgical History: Last updated: 11/05/2009 1992 CCY 1999 HYST. BLADDER  TACK 1982 PUD/BLEED/TRANSFUSION 2001 CARDIAC CATH KIDNEY STONES/ CYSTO 2002 DEXA 2002 PARTIAL NEPHRECTOMY/ RENAL CELL CA 2003 COLONOS,  POLYP,HEM, DIVERTIC 2003 EGD POLYP, GASTRITIS, HH 2004 CAROTID DOPPLERS NML 2.2006 LS DEGEN. CHANGES  10/27 /08  2nd time 6.2006 HOSP DIARRHEA/ ERCP 9.2007 HOSP RENAL FAIL 07/12/07 - ICD -  Guidant Contak Renewal III 1.2008 COLONOSC POLYPS RE CK 1 YR 2008 CATARACTS Right eye OPTHY FALL 01/1.04/3.06 MICROALB 2.02 11.06 ERCP's and clearing of choledocholithiasis 2010-2011  Family History: Last updated: 05/25/2009 daughter breast ca high chol in family No FH of Colon Cancer: Family History of Esophageal Cancer: son died 57  Family History of Diabetes:  2 daughters, 1 son Family History of Heart Disease: daughter  Social History: Last updated: 01/29/2009 non smoker no alcohol lives with daughter who cares for her  Occupation:  retired Illicit Drug Use - no Widowed   Risk Factors: Smoking Status: never (12/28/2006)  Review of Systems General:  Denies fatigue, loss of appetite, and malaise. Eyes:  Denies blurring and eye irritation. CV:  Denies chest pain or discomfort, lightheadness, palpitations, shortness of breath with exertion, and swelling of feet. Resp:  Denies cough, shortness of breath, and wheezing. GI:  Denies abdominal pain, change in bowel habits, indigestion, and nausea. MS:  Complains of joint pain and stiffness; denies muscle aches and cramps. Derm:  Denies lesion(s), poor wound healing, and rash. Neuro:  Denies numbness and tingling. Psych:  Denies anxiety and depression. Endo:  Denies cold intolerance, excessive thirst, excessive urination, and heat intolerance. Heme:  Denies abnormal bruising and bleeding.  Physical Exam  General:  elderly and well appearing Head:  normocephalic, atraumatic, and no abnormalities observed.   Eyes:  vision grossly intact, pupils equal, pupils round, pupils reactive to light, and no  injection.   Mouth:  pharynx pink and moist.   Neck:  supple with full rom and no masses or thyromegally, no JVD or carotid bruit  Lungs:  Normal respiratory effort, chest expands symmetrically. Lungs are clear to auscultation, no crackles or wheezes. Heart:  RRR Abdomen:  Bowel sounds positive,abdomen soft and non-tender without masses, organomegaly or hernias noted. no renal bruits  Msk:  No deformity or scoliosis noted of thoracic or lumbar spine.  mild kyphosis  Extremities:  No clubbing, cyanosis, edema, or deformity noted with normal full range of motion of all joints.   feet well cared for with good nail care Neurologic:  sensation intact to light touch, gait normal, and DTRs symmetrical and normal.   Skin:  Intact without suspicious lesions or rashes Cervical Nodes:  No lymphadenopathy noted Inguinal Nodes:  No significant adenopathy Psych:  normal affect, talkative and pleasant   Diabetes Management Exam:    Foot Exam (with socks and/or shoes not present):       Sensory-Pinprick/Light touch:          Left medial foot (L-4): normal          Left dorsal foot (L-5): normal          Left lateral foot (S-1): normal          Right medial foot (L-4): normal          Right dorsal foot (L-5): normal          Right lateral foot (S-1): normal       Sensory-Monofilament:          Left foot: normal          Right foot: normal       Inspection:          Left foot: normal          Right foot: normal       Nails:          Left foot: normal          Right foot: normal    Eye Exam:       Eye Exam done elsewhere          Date: 08/15/2009          Results: normal          Done by: Christina Cheryll Cockayne    Impression & Recommendations:  Problem # 1:  PURE HYPERCHOLESTEROLEMIA (ICD-272.0) Assessment Unchanged  has been fairly controlled with low dose statin and diet  rev low sat fat diet today  lab and update  f/u 6 mo Her updated medication list for this problem includes:    Simvastatin 20  Mg Tabs (Simvastatin) .Marland Kitchen... 1 by mouth  at bedtime  Orders: Venipuncture (11914) TLB-Lipid Panel (80061-LIPID) TLB-ALT (SGPT) (84460-ALT) TLB-AST (SGOT) (84450-SGOT) TLB-Renal Function Panel (80069-RENAL)  Labs Reviewed: SGOT: 17 (11/05/2009)   SGPT: 12 (11/05/2009)   HDL:38.30 (10/22/2009), 31.7 (08/07/2008)  LDL:DEL (08/07/2008), DEL (11/05/2007)  Chol:163 (10/22/2009), 123 (08/07/2008)  Trig:318.0 (10/22/2009), 257 (08/07/2008)  Problem # 2:  TRANSAMINASES, SERUM, ELEVATED, AND ALK PHOS (ICD-790.4) Assessment: Comment Only check ast/alt today no symptoms  suspect resolved now after ercp Orders: Venipuncture (78295) TLB-Lipid Panel (80061-LIPID) TLB-ALT (SGPT) (84460-ALT) TLB-AST (SGOT) (84450-SGOT) TLB-Renal Function Panel (80069-RENAL)  Problem # 3:  DIABETES MELLITUS, TYPE II (ICD-250.00) Assessment: Unchanged  per pt - good control with endocrine utd opthy  Her updated medication list for this problem includes:    Altace 10 Mg Caps (Ramipril) ..... One by mouth daily    Humalog Mix 75/25 Pen 75-25 % Susp (Insulin lispro prot & lispro) .Marland Kitchen... 8 units in am and 8 units in pm  Orders: TLB-Lipid Panel (80061-LIPID) TLB-ALT (SGPT) (84460-ALT) TLB-AST (SGOT) (84450-SGOT) TLB-Renal Function Panel (80069-RENAL)  Labs Reviewed: Creat: 1.4 (05/24/2009)    Reviewed HgBA1c results: 8.2 (02/26/2009)  7.6 (08/07/2008)  Problem # 4:  HYPERTENSION (ICD-401.9) Assessment: Unchanged  ths was better on second check home readings good  no change in tx f/u 6 mo  Her updated medication list for this problem includes:    Altace 10 Mg Caps (Ramipril) ..... One by mouth daily    Coreg 12.5 Mg Tabs (Carvedilol) .Marland Kitchen... 1 and 1/2 tab by mouth two times a day    Furosemide 20 Mg Tabs (Furosemide) .Marland Kitchen... Take 2 by mouth every other day    Norvasc 5 Mg Tabs (Amlodipine besylate) .Marland Kitchen... Take 1/2 by mouth two times a day  Orders: Venipuncture (62130) TLB-Lipid Panel (80061-LIPID) TLB-ALT  (SGPT) (84460-ALT) TLB-AST (SGOT) (84450-SGOT) TLB-Renal Function Panel (80069-RENAL)  BP today: 156/60- re check 130/70 at rest  Prior BP: 148/76 (11/05/2009)  Labs Reviewed: K+: 4.3 (05/24/2009) Creat: : 1.4 (05/24/2009)   Chol: 163 (10/22/2009)   HDL: 38.30 (10/22/2009)   LDL: DEL (08/07/2008)   TG: 318.0 (10/22/2009)  BP today: 156/60 Prior BP: 148/76 (11/05/2009)  Labs Reviewed: K+: 4.3 (05/24/2009) Creat: : 1.4 (05/24/2009)   Chol: 163 (10/22/2009)   HDL: 38.30 (10/22/2009)   LDL: DEL (08/07/2008)   TG: 318.0 (10/22/2009)  Complete Medication List: 1)  Altace 10 Mg Caps (Ramipril) .... One by mouth daily 2)  Bd Ultra-fine Lancets Misc (Lancets) .... Use to check glucose once daily and in the evening 3)  Coreg 12.5 Mg Tabs (Carvedilol) .Marland Kitchen.. 1 and 1/2 tab by mouth two times a day 4)  Gabapentin 300 Mg Caps (Gabapentin) .... Two by mouth two times a day 5)  Nexium 40 Mg Cpdr (Esomeprazole magnesium) .... Take one by mouth daily 6)  Freestyle Test Strp (Glucose blood) .... Test two times a day as directed 7)  Simvastatin 20 Mg Tabs (Simvastatin) .Marland Kitchen.. 1 by mouth at bedtime 8)  Furosemide 20 Mg Tabs (Furosemide) .... Take 2 by mouth every other day 9)  Humalog Mix 75/25 Pen 75-25 % Susp (Insulin lispro prot & lispro) .... 8 units in am and 8 units in pm 10)  Norvasc 5 Mg Tabs (Amlodipine besylate) .... Take 1/2 by mouth two times a day 11)  Healthy Colon Caps (Misc intestinal flora regulat) .... Take one by mouth daily 12)  Vitamin D 1000 Unit Caps (Cholecalciferol) .... Take one by mouth daily 13)  Loperamide Hcl 2 Mg Tabs (Loperamide hcl) .Marland KitchenMarland KitchenMarland Kitchen  As needed 14)  Dicyclomine Hcl 10 Mg Caps (Dicyclomine hcl) .Marland Kitchen.. 1-2 by mouth ac meals 15)  Pataday 0.2 % Soln (Olopatadine hcl) .Marland Kitchen.. 1 drop into each eye two times a day  Patient Instructions: 1)  I'm glad you are doing well  2)  blood pressure is better on second check  3)  labs today  4)  I sent lasix to your pharmacy  5)  follow up  with me in 6 months  Prescriptions: FUROSEMIDE 20 MG  TABS (FUROSEMIDE) take 2 by mouth every other day  #30 x 11   Entered and Authorized by:   [name]   Signed by:   [name] on 02/18/2010   Method used:   Electronically to        [facility]. [address] [phone]* (retail)       [address] [place], [place]  [id number]       Ph: [phone]       Fax: [phone]   RxID:   [id number]   Current Allergies (reviewed today): ! CELEBREX (CELECOXIB) ! ASPIRIN EC (ASPIRIN) ! AVELOX (MOXIFLOXACIN HCL) ! TRICOR (FENOFIBRATE) ! LIPITOR (ATORVASTATIN CALCIUM) ! NIASPAN (NIACIN (ANTIHYPERLIPIDEMIC)) ! METFORMIN HCL (METFORMIN HCL)

## 2010-10-17 NOTE — Letter (Signed)
Summary: Clearance Letter  [facility], Main Office  [address]   [place], [place] [address]   Phone: [phone]  Fax: [phone]    October 01, 2009  Re:     [name] Address:   [address]     [place], [place]  [address] DOB:     09-Feb-1924 MRN:     [id number]   To Whom it May Concern,    The above patient is under our care. I last saw the patient in September. Based on that visit she was at low risk for ERCP at that time. She has a history of CAD and Cardiomyopathy. She has an ICD that may or may not need to be turned off. That would be left up to Anesthesia. They are very familiar with that process. If you have any further questions feel free to contact my office at [phone].            Sincerely,  Duncan Dull, RN, BSN Dietrich Pates, MD, [facility]

## 2010-11-11 ENCOUNTER — Encounter: Payer: Self-pay | Admitting: Internal Medicine

## 2010-11-11 ENCOUNTER — Encounter (INDEPENDENT_AMBULATORY_CARE_PROVIDER_SITE_OTHER): Payer: Medicare Other | Admitting: Internal Medicine

## 2010-11-11 DIAGNOSIS — Z95810 Presence of automatic (implantable) cardiac defibrillator: Secondary | ICD-10-CM

## 2010-11-11 DIAGNOSIS — I428 Other cardiomyopathies: Secondary | ICD-10-CM

## 2010-11-11 DIAGNOSIS — I5032 Chronic diastolic (congestive) heart failure: Secondary | ICD-10-CM

## 2010-11-14 ENCOUNTER — Ambulatory Visit: Payer: Self-pay | Admitting: Internal Medicine

## 2010-11-15 ENCOUNTER — Ambulatory Visit (INDEPENDENT_AMBULATORY_CARE_PROVIDER_SITE_OTHER): Payer: Medicare Other | Admitting: Internal Medicine

## 2010-11-15 ENCOUNTER — Encounter: Payer: Self-pay | Admitting: Internal Medicine

## 2010-11-15 DIAGNOSIS — I509 Heart failure, unspecified: Secondary | ICD-10-CM

## 2010-11-15 DIAGNOSIS — I10 Essential (primary) hypertension: Secondary | ICD-10-CM

## 2010-11-15 DIAGNOSIS — E7800 Pure hypercholesterolemia, unspecified: Secondary | ICD-10-CM

## 2010-11-21 NOTE — Assessment & Plan Note (Signed)
Summary: per check out/sf. F6M/lj/ R/S due to schedule changes-mb      Allergies Added:   Referring Provider:  Thompson Caul Ramchandani,MD Primary Provider:  Roxy Manns, MD  CC:  no cardaic complaints today.  History of Present Illness: Patient is a 77 year old with a history of NICM (now normal function), s/p ICD, dyslipidemia and hypertension.  I saw her in the fall. She says she is feeling very good.  No chest pains.  Breathing is OK.  No dizziness or falling.  Recently seen by Odessa Fleming.  Current Medications (verified): 1)  Altace 10 Mg Caps (Ramipril) .... One By Mouth Daily 2)  Bd Ultra-Fine Lancets  Misc (Lancets) .... Use To Check Glucose Once Daily and in The Evening 3)  Coreg 12.5 Mg Tabs (Carvedilol) .Marland Kitchen.. 1 and 1/2 Tab By Mouth Two Times A Day 4)  Gabapentin 300 Mg  Caps (Gabapentin) .... Two By Mouth Two Times A Day 5)  Nexium 40 Mg  Cpdr (Esomeprazole Magnesium) .... Take One By Mouth Daily 6)  Freestyle Test   Strp (Glucose Blood) .... Test Two Times A Day As Directed 7)  Simvastatin 20 Mg  Tabs (Simvastatin) .Marland Kitchen.. 1 By Mouth At Bedtime 8)  Furosemide 20 Mg  Tabs (Furosemide) .... Take 2 By Mouth Every Other Day 9)  Humalog Mix 75/25 Pen 75-25 % Susp (Insulin Lispro Prot & Lispro) .... 8 Units in Am and 8 Units in Pm 10)  Norvasc 5 Mg Tabs (Amlodipine Besylate) .... Take 1/2 By Mouth Two Times A Day 11)  Healthy Colon  Caps (Misc Intestinal Flora Regulat) .... Take One By Mouth Daily 12)  Vitamin D3 1000 Unit Caps (Cholecalciferol) .Marland Kitchen.. 1 By Mouth Daily 13)  Loperamide Hcl 2 Mg Tabs (Loperamide Hcl) .... As Needed 14)  Dicyclomine Hcl 10 Mg Caps (Dicyclomine Hcl) .Marland Kitchen.. 1-2 By Mouth Ac Meals 15)  Pataday 0.2 % Soln (Olopatadine Hcl) .Marland Kitchen.. 1 Drop Into Each Eye Two Times A Day 16)  Tylenol 325 Mg Tabs (Acetaminophen) .... Otc As Directed.  Allergies (verified): 1)  ! Celebrex (Celecoxib) 2)  ! Aspirin Ec (Aspirin) 3)  ! Avelox (Moxifloxacin Hcl) 4)  ! Tricor (Fenofibrate) 5)  !  Lipitor (Atorvastatin Calcium) 6)  ! Niaspan (Niacin (Antihyperlipidemic)) 7)  ! Metformin Hcl (Metformin Hcl)  Past History:  Past medical, surgical, family and social histories (including risk factors) reviewed, and no changes noted (except as noted below).  Past Medical History: Reviewed history from 07/11/2009 and no changes required. Diabetes mellitus, type II Hypertension Coronary artery disease, pulm htn congestive heart failure, (non ish cardiomyopathy) gout Osteoarthritis, SPINE, KNEES Allergic rhinitis Hyperlipidemia, TRIG Osteopenia IBS choledocholithiasis and cholangitis  cardiol-- Tenny Craw endo- [facility]  [employer]  Past Surgical History: Reviewed history from 11/05/2009 and no changes required. 1992 CCY 1999 HYST. BLADDER TACK 1982 PUD/BLEED/TRANSFUSION 2001 CARDIAC CATH KIDNEY STONES/ CYSTO 2002 DEXA 2002 PARTIAL NEPHRECTOMY/ RENAL CELL CA 2003 COLONOS,  POLYP,HEM, DIVERTIC 2003 EGD POLYP, GASTRITIS, HH 2004 CAROTID DOPPLERS NML 2.2006 LS DEGEN. CHANGES  10/27 /08  2nd time 6.2006 HOSP DIARRHEA/ ERCP 9.2007 HOSP RENAL FAIL 07/12/07 - ICD -  Guidant Contak Renewal III 1.2008 COLONOSC POLYPS RE CK 1 YR 2008 CATARACTS Right eye OPTHY FALL 01/1.04/3.06 MICROALB 2.02 11.06 ERCP's and clearing of choledocholithiasis 2010-2011  Family History: Reviewed history from 05/25/2009 and no changes required. daughter breast ca high chol in family No FH of Colon Cancer: Family History of Esophageal Cancer: son died 42  Family History  of Diabetes:  2 daughters, 1 son Family History of Heart Disease: daughter  Social History: Reviewed history from 01/29/2009 and no changes required. 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  ALl systems neg to the above.  Vital Signs:  Patient profile:   77 year old female Height:      621 inches Weight:      157  pounds BMI:     0.29 Pulse rate:   76 / minute BP sitting:   160 / 70  (left arm) Cuff size:   large  Vitals Entered By: Burnett Kanaris, CNA (November 15, 2010 3:35 PM)  Physical Exam  Additional Exam:  Patinet is in NAD HEENT:  Normocephalic, atraumatic. EOMI, PERRLA.  Neck: JVP is normal. No thyromegaly. No bruits.  Lungs: clear to auscultation. No rales no wheezes.  Heart: Regular rate and rhythm. Normal S1, S2. No S3.   No significant murmurs. PMI not displaced.  Abdomen:  Supple, nontender. Normal bowel sounds. No masses. No hepatomegaly.  Extremities:   Good distal pulses throughout. No lower extremity edema.  Musculoskeletal :moving all extremities.  Neuro:   alert and oriented x3.     ICD Specifications ICD Vendor:  [employer]     ICD Model Number:  H175     ICD Serial Number:  [id number] ICD DOI:  07/12/2007     ICD Implanting MD:  Sherryl Manges, MD  Lead 1:    Location: RA     DOI: 03/30/2002     Model #: [id number]     Serial #: [id number]     Status: active Lead 2:    Location: RV     DOI: 03/30/2002     Model #: [id number]     Serial #: [id number]     Status: active Lead 3:    Location: LV     DOI: 09/27/2003     Model #: [id number]     Serial #: [id number]     Status: active  Indications::  NICM  Explantation Comments: Latitude  Impression & Recommendations:  Problem # 1:  CARDIOMYOPATHY, SECONDARY (ICD-425.9) LVEF is normal.  Doing good.  Volume is good. Keep on same regimen. Her updated medication list for this problem includes:    Altace 10 Mg Caps (Ramipril) ..... One by mouth daily    Coreg 12.5 Mg Tabs (Carvedilol) .Marland Kitchen... 1 and 1/2 tab by mouth two times a day    Furosemide 20 Mg Tabs (Furosemide) .Marland Kitchen... Take 2 by mouth every other day    Norvasc 5 Mg Tabs (Amlodipine besylate) .Marland Kitchen... Take 1/2 by mouth two times a day  Problem # 2:  PURE HYPERCHOLESTEROLEMIA (ICD-272.0) Liipds good on check by M. Tower.  Continue. Her updated medication list for this problem includes:     Simvastatin 20 Mg Tabs (Simvastatin) .Marland Kitchen... 1 by mouth at bedtime  Problem # 3:  HYPERTENSION (ICD-401.9) BP is high here but review of daily diary it is good. (130/60) No changes.   Bring cuff in perioldically to make sure accurate.  Patient Instructions: 1)  Your physician wants you to follow-up in: October with Dr.Lamanda Rudder    You will receive a reminder letter in the mail two months in advance. If you don't receive a letter, please call our office to schedule the follow-up appointment.

## 2010-11-21 NOTE — Assessment & Plan Note (Signed)
Summary: defib check gdt.amber/ needs ekg/jml    Visit Type:  Follow-up Referring Provider:  Thompson Caul Ramchandani,MD Primary Provider:  Roxy Manns, MD  CC:  NO C/O.  History of Present Illness: Patient is a 77 year old with a history of NICM (now normal function), s/p ICD, dyslipidemia and hypertension.  She says that she isn't feeling considerablelast couple of months. She is not sure why. She has moer  energy. She is doing more chores around the house i.e. shelling beans and pea.  Lowella Dandy     she denies chest pain   Current Medications (verified): 1)  Altace 10 Mg Caps (Ramipril) .... One By Mouth Daily 2)  Bd Ultra-Fine Lancets  Misc (Lancets) .... Use To Check Glucose Once Daily and in The Evening 3)  Coreg 12.5 Mg Tabs (Carvedilol) .Marland Kitchen.. 1 and 1/2 Tab By Mouth Two Times A Day 4)  Gabapentin 300 Mg  Caps (Gabapentin) .... Two By Mouth Two Times A Day 5)  Nexium 40 Mg  Cpdr (Esomeprazole Magnesium) .... Take One By Mouth Daily 6)  Freestyle Test   Strp (Glucose Blood) .... Test Two Times A Day As Directed 7)  Simvastatin 20 Mg  Tabs (Simvastatin) .Marland Kitchen.. 1 By Mouth At Bedtime 8)  Furosemide 20 Mg  Tabs (Furosemide) .... Take 2 By Mouth Every Other Day 9)  Humalog Mix 75/25 Pen 75-25 % Susp (Insulin Lispro Prot & Lispro) .... 8 Units in Am and 8 Units in Pm 10)  Norvasc 5 Mg Tabs (Amlodipine Besylate) .... Take 1/2 By Mouth Two Times A Day 11)  Healthy Colon  Caps (Misc Intestinal Flora Regulat) .... Take One By Mouth Daily 12)  Vitamin D3 1000 Unit Caps (Cholecalciferol) .Marland Kitchen.. 1 By Mouth Daily 13)  Loperamide Hcl 2 Mg Tabs (Loperamide Hcl) .... As Needed 14)  Dicyclomine Hcl 10 Mg Caps (Dicyclomine Hcl) .Marland Kitchen.. 1-2 By Mouth Ac Meals 15)  Pataday 0.2 % Soln (Olopatadine Hcl) .Marland Kitchen.. 1 Drop Into Each Eye Two Times A Day 16)  Tylenol 325 Mg Tabs (Acetaminophen) .... Otc As Directed.  Allergies: 1)  ! Celebrex (Celecoxib) 2)  ! Aspirin Ec (Aspirin) 3)  ! Avelox (Moxifloxacin Hcl) 4)  ! Tricor  (Fenofibrate) 5)  ! Lipitor (Atorvastatin Calcium) 6)  ! Niaspan (Niacin (Antihyperlipidemic)) 7)  ! Metformin Hcl (Metformin Hcl)  Vital Signs:  Patient profile:   77 year old female Height:      61 inches Weight:      156.50 pounds BMI:     29.68 BP sitting:   162 / 72  (left arm) Cuff size:   large  Vitals Entered By: Scherrie Bateman, LPN (November 11, 2010 4:29 PM)  Physical Exam  General:  The patient was alert and oriented in no acute distress. HEENT Normal.  Neck veins were flat, carotids were brisk.  Lungs were clear.  Heart sounds were regular without murmurs or gallops.  Abdomen was soft with active bowel sounds. There is no clubbing cyanosis or edema. Skin Warm and dry     ICD Specifications ICD Vendor:  [employer]     ICD Model Number:  H175     ICD Serial Number:  [id number] ICD DOI:  07/12/2007     ICD Implanting MD:  Sherryl Manges, MD  Lead 1:    Location: RA     DOI: 03/30/2002     Model #: [id number]     Serial #: [id number]     Status: active Lead 2:  Location: RV     DOI: 03/30/2002     Model #: [id number]     Serial #: [id number]     Status: active Lead 3:    Location: LV     DOI: 09/27/2003     Model #: [id number]     Serial #: [id number]     Status: active  Indications::  NICM  Explantation Comments: Latitude  Impression & Recommendations:  Problem # 1:  AUTOMATIC IMPLANTABLE CARDIAC DEFIBRILLATOR SITU (ICD-V45.02) Device parameters and data were reviewed and no changes were made  She is at battery status  MOL2  Problem # 2:  CARDIOMYOPATHY, SECONDARY (ICD-425.9) stable Her updated medication list for this problem includes:    Altace 10 Mg Caps (Ramipril) ..... One by mouth daily    Coreg 12.5 Mg Tabs (Carvedilol) .Marland Kitchen... 1 and 1/2 tab by mouth two times a day    Furosemide 20 Mg Tabs (Furosemide) .Marland Kitchen... Take 2 by mouth every other day    Norvasc 5 Mg Tabs (Amlodipine besylate) .Marland Kitchen... Take 1/2 by mouth two times a day  Problem # 3:  AV BLOCK, COMPLETE  (ICD-426.0) stable Her updated medication list for this problem includes:    Altace 10 Mg Caps (Ramipril) ..... One by mouth daily    Coreg 12.5 Mg Tabs (Carvedilol) .Marland Kitchen... 1 and 1/2 tab by mouth two times a day    Norvasc 5 Mg Tabs (Amlodipine besylate) .Marland Kitchen... Take 1/2 by mouth two times a day  Other Orders: EKG w/ Interpretation (93000)  Patient Instructions: 1)  Your physician recommends that you continue on your current medications as directed. Please refer to the Current Medication list given to you today. 2)  Your physician wants you to follow-up in:  YEAR  WITH DR Logan Bores will receive a reminder letter in the mail two months in advance. If you don't receive a letter, please call our office to schedule the follow-up appointment.

## 2010-11-21 NOTE — Cardiovascular Report (Signed)
Summary: Office Visit   Office Visit   Imported By: Roderic Ovens 11/14/2010 16:12:36  _____________________________________________________________________  External Attachment:    Type:   Image     Comment:   External Document

## 2010-12-01 LAB — CBC
MCHC: 33.4 g/dL (ref 30.0–36.0)
MCHC: 33.7 g/dL (ref 30.0–36.0)
MCV: 85.6 fL (ref 78.0–100.0)
MCV: 86.1 fL (ref 78.0–100.0)
MCV: 86.4 fL (ref 78.0–100.0)
Platelets: 145 10*3/uL — ABNORMAL LOW (ref 150–400)
Platelets: 183 10*3/uL (ref 150–400)
RBC: 4.2 MIL/uL (ref 3.87–5.11)
RDW: 14.4 % (ref 11.5–15.5)
RDW: 14.6 % (ref 11.5–15.5)
WBC: 9.3 10*3/uL (ref 4.0–10.5)

## 2010-12-01 LAB — BASIC METABOLIC PANEL
BUN: 40 mg/dL — ABNORMAL HIGH (ref 6–23)
CO2: 27 mEq/L (ref 19–32)
Calcium: 9 mg/dL (ref 8.4–10.5)
Creatinine, Ser: 1.1 mg/dL (ref 0.4–1.2)
GFR calc Af Amer: 57 mL/min — ABNORMAL LOW (ref >60)

## 2010-12-01 LAB — DIFFERENTIAL
Basophils Absolute: 0 10*3/uL (ref 0.0–0.1)
Basophils Relative: 0 % (ref 0–1)
Eosinophils Absolute: 0.1 10*3/uL (ref 0.0–0.7)
Monocytes Relative: 6 % (ref 3–12)
Neutro Abs: 4.4 10*3/uL (ref 1.7–7.7)
Neutrophils Relative %: 68 % (ref 43–77)

## 2010-12-01 LAB — COMPREHENSIVE METABOLIC PANEL
AST: 13 U/L (ref 0–37)
Albumin: 3 g/dL — ABNORMAL LOW (ref 3.5–5.2)
Albumin: 3.2 g/dL — ABNORMAL LOW (ref 3.5–5.2)
Alkaline Phosphatase: 63 U/L (ref 39–117)
BUN: 27 mg/dL — ABNORMAL HIGH (ref 6–23)
Calcium: 8.5 mg/dL (ref 8.4–10.5)
Chloride: 108 mEq/L (ref 96–112)
Chloride: 110 mEq/L (ref 96–112)
Creatinine, Ser: 0.99 mg/dL (ref 0.4–1.2)
GFR calc Af Amer: >60 mL/min (ref >60)
GFR calc Af Amer: >60 mL/min (ref >60)
Glucose, Bld: 151 mg/dL — ABNORMAL HIGH (ref 70–99)
Sodium: 143 mEq/L (ref 135–145)
Total Bilirubin: 0.5 mg/dL (ref 0.3–1.2)
Total Bilirubin: 0.7 mg/dL (ref 0.3–1.2)

## 2010-12-01 LAB — GLUCOSE, CAPILLARY
Glucose-Capillary: 126 mg/dL — ABNORMAL HIGH (ref 70–99)
Glucose-Capillary: 148 mg/dL — ABNORMAL HIGH (ref 70–99)

## 2010-12-18 LAB — GLUCOSE, CAPILLARY: Glucose-Capillary: 207 mg/dL — ABNORMAL HIGH (ref 70–99)

## 2010-12-21 LAB — HEPATIC FUNCTION PANEL
ALT: 736 U/L — ABNORMAL HIGH (ref 0–35)
AST: 297 U/L — ABNORMAL HIGH (ref 0–37)
AST: 976 U/L — ABNORMAL HIGH (ref 0–37)
Albumin: 3.2 g/dL — ABNORMAL LOW (ref 3.5–5.2)
Alkaline Phosphatase: 206 U/L — ABNORMAL HIGH (ref 39–117)
Alkaline Phosphatase: 213 U/L — ABNORMAL HIGH (ref 39–117)
Bilirubin, Direct: 0.3 mg/dL (ref 0.0–0.3)
Bilirubin, Direct: 0.6 mg/dL — ABNORMAL HIGH (ref 0.0–0.3)
Bilirubin, Direct: 0.9 mg/dL — ABNORMAL HIGH (ref 0.0–0.3)
Indirect Bilirubin: 0.9 mg/dL (ref 0.3–0.9)
Indirect Bilirubin: 0.9 mg/dL (ref 0.3–0.9)
Total Bilirubin: 1.5 mg/dL — ABNORMAL HIGH (ref 0.3–1.2)
Total Protein: 6.5 g/dL (ref 6.0–8.3)

## 2010-12-21 LAB — CBC
HCT: 31.3 % — ABNORMAL LOW (ref 36.0–46.0)
Hemoglobin: 10.8 g/dL — ABNORMAL LOW (ref 12.0–15.0)
RBC: 3.63 MIL/uL — ABNORMAL LOW (ref 3.87–5.11)
WBC: 3.4 10*3/uL — ABNORMAL LOW (ref 4.0–10.5)

## 2010-12-21 LAB — MAGNESIUM: Magnesium: 1.5 mg/dL (ref 1.5–2.5)

## 2010-12-21 LAB — DIFFERENTIAL
Basophils Absolute: 0 10*3/uL (ref 0.0–0.1)
Lymphocytes Relative: 5 % — ABNORMAL LOW (ref 12–46)
Lymphs Abs: 0.2 10*3/uL — ABNORMAL LOW (ref 0.7–4.0)
Neutrophils Relative %: 90 % — ABNORMAL HIGH (ref 43–77)

## 2010-12-21 LAB — COMPREHENSIVE METABOLIC PANEL
ALT: 209 U/L — ABNORMAL HIGH (ref 0–35)
BUN: 32 mg/dL — ABNORMAL HIGH (ref 6–23)
CO2: 32 mEq/L (ref 19–32)
Calcium: 10.1 mg/dL (ref 8.4–10.5)
Creatinine, Ser: 1.3 mg/dL — ABNORMAL HIGH (ref 0.4–1.2)
GFR calc non Af Amer: 39 mL/min — ABNORMAL LOW (ref >60)
Glucose, Bld: 141 mg/dL — ABNORMAL HIGH (ref 70–99)
Sodium: 139 mEq/L (ref 135–145)
Total Protein: 6.9 g/dL (ref 6.0–8.3)

## 2010-12-21 LAB — BASIC METABOLIC PANEL
GFR calc Af Amer: 59 mL/min — ABNORMAL LOW (ref >60)
GFR calc non Af Amer: 48 mL/min — ABNORMAL LOW (ref >60)
Potassium: 3.5 mEq/L (ref 3.5–5.1)
Sodium: 138 mEq/L (ref 135–145)

## 2010-12-21 LAB — GLUCOSE, CAPILLARY
Glucose-Capillary: 109 mg/dL — ABNORMAL HIGH (ref 70–99)
Glucose-Capillary: 140 mg/dL — ABNORMAL HIGH (ref 70–99)
Glucose-Capillary: 148 mg/dL — ABNORMAL HIGH (ref 70–99)
Glucose-Capillary: 148 mg/dL — ABNORMAL HIGH (ref 70–99)
Glucose-Capillary: 148 mg/dL — ABNORMAL HIGH (ref 70–99)
Glucose-Capillary: 168 mg/dL — ABNORMAL HIGH (ref 70–99)
Glucose-Capillary: 184 mg/dL — ABNORMAL HIGH (ref 70–99)
Glucose-Capillary: 185 mg/dL — ABNORMAL HIGH (ref 70–99)
Glucose-Capillary: 77 mg/dL (ref 70–99)
Glucose-Capillary: 78 mg/dL (ref 70–99)

## 2010-12-21 LAB — CARDIAC PANEL(CRET KIN+CKTOT+MB+TROPI)
CK, MB: 1.6 ng/mL (ref 0.3–4.0)
Total CK: 61 U/L (ref 7–177)
Troponin I: 0.03 ng/mL (ref 0.00–0.06)

## 2010-12-21 LAB — URINALYSIS, MICROSCOPIC ONLY
Bilirubin Urine: NEGATIVE
Ketones, ur: NEGATIVE mg/dL
Nitrite: POSITIVE — AB
Specific Gravity, Urine: 1.012 (ref 1.005–1.030)
Urobilinogen, UA: 1 mg/dL (ref 0.0–1.0)
pH: 5 (ref 5.0–8.0)

## 2010-12-21 LAB — URINE CULTURE

## 2010-12-21 LAB — CK TOTAL AND CKMB (NOT AT ARMC): CK, MB: 1.9 ng/mL (ref 0.3–4.0)

## 2010-12-21 LAB — D-DIMER, QUANTITATIVE: D-Dimer, Quant: 4.02 ug/mL-FEU — ABNORMAL HIGH (ref 0.00–0.48)

## 2010-12-21 LAB — TROPONIN I: Troponin I: 0.08 ng/mL — ABNORMAL HIGH (ref 0.00–0.06)

## 2010-12-21 LAB — BRAIN NATRIURETIC PEPTIDE: Pro B Natriuretic peptide (BNP): <30 pg/mL (ref 0.0–100.0)

## 2010-12-21 LAB — AMMONIA: Ammonia: 21 umol/L (ref 11–35)

## 2010-12-23 LAB — GLUCOSE, CAPILLARY: Glucose-Capillary: 206 mg/dL — ABNORMAL HIGH (ref 70–99)

## 2010-12-24 LAB — POCT I-STAT, CHEM 8
BUN: 34 mg/dL — ABNORMAL HIGH (ref 6–23)
Calcium, Ion: 1.12 mmol/L (ref 1.12–1.32)
Hemoglobin: 12.2 g/dL (ref 12.0–15.0)
TCO2: 29 mmol/L (ref 0–100)

## 2010-12-31 ENCOUNTER — Telehealth: Payer: Self-pay | Admitting: *Deleted

## 2010-12-31 NOTE — Telephone Encounter (Signed)
This is bentyl--an antispasmotic Please ask family member - what is she taking it for and are there other medicines she has tried for that condition and failed  Thanks -- I will hold form

## 2010-12-31 NOTE — Telephone Encounter (Signed)
Prior [name] is needed for dicyclomine, form is on your shelf.

## 2010-12-31 NOTE — Telephone Encounter (Signed)
Patient's daughter notified as instructed by telephone. 

## 2010-12-31 NOTE — Telephone Encounter (Signed)
I filled out prior Berkley Harvey- will see what they say  Will put in IN box

## 2010-12-31 NOTE — Telephone Encounter (Signed)
Spoke with pt's daughter,Pt takes Bentyl to prevent diarrhea and spasms due to diverticulosis. Pt has not tried any other med for this condition but pt's daughter said they would be willing to try a different med if Dr Milinda Antis thought it would help.Please advise.Marland Kitchen

## 2011-01-01 ENCOUNTER — Telehealth: Payer: Self-pay

## 2011-01-01 NOTE — Telephone Encounter (Signed)
Completed form faxed to [phone] as instructed. Form given to [place]. Copy of form retained at my desk.

## 2011-01-01 NOTE — Telephone Encounter (Signed)
Prior auth given for dicyclomine, advised pharmacy, approval letter placed on doctor's desk for signature and scanning.

## 2011-01-05 ENCOUNTER — Other Ambulatory Visit: Payer: Self-pay | Admitting: Family Medicine

## 2011-01-05 ENCOUNTER — Other Ambulatory Visit: Payer: Self-pay | Admitting: Internal Medicine

## 2011-01-06 ENCOUNTER — Other Ambulatory Visit: Payer: Self-pay

## 2011-01-06 MED ORDER — GABAPENTIN 300 MG PO CAPS: 600.0000 mg | ORAL_CAPSULE | Freq: Two times a day (BID) | ORAL | Status: DC

## 2011-01-06 NOTE — Telephone Encounter (Signed)
Pt has appt scheduled to see Dr Milinda Antis 02/20/11.Please advise.

## 2011-01-06 NOTE — Telephone Encounter (Signed)
Medication phoned to [facility] [address] pharmacy as instructed.

## 2011-01-06 NOTE — Telephone Encounter (Signed)
Px written for call in   

## 2011-01-28 NOTE — Discharge Summary (Signed)
[name], [name] NO.:  [id number]   MEDICAL RECORD NO.:  [id number]          PATIENT TYPE:  INP   LOCATION:  2852                         FACILITY:  [facility]   PHYSICIAN:  Maple Mirza, PA   DATE OF BIRTH:  February 28, 1924   DATE OF ADMISSION:  07/12/2007  DATE OF DISCHARGE:                               DISCHARGE SUMMARY   This dictation greater than 30 minutes, and her allergies are to  CELEBREX, ASPIRIN, AVELOX TRICOR, LIPITOR, METFORMIN and NIASPAN.   FINAL DIAGNOSES:  1. Guidant model H175 implanted September 27, 2003, for nonischemic      cardiomyopathy and complete heart block, at end of life.  2. Generator change out on implantable cardioverter-defibrillator      July 12, 2007.  Atrial lead was inadequately plugged and a      Guidant Contak Renewal III, model H175, was reimplanted, Dr. Graciela Husbands,      electrophysiologist.   SECONDARY DIAGNOSES:  1. Nonischemic cardiomyopathy, ejection fraction is 45% at      echocardiogram Jan 27, 2007.  2. History of polymorphic ventricular tachycardia.  3. Permanent atrial fibrillation.  4. History of complete heart block.  5. Status post, CRT-D, for all of the above.   PROCEDURE:  Explantation of existing Guidant Contak Renewal with implant  of new Guidant Contak Renewal generator, Dr. Sherryl Manges, October 27.   BRIEF HISTORY:  [name] is an 77 year old female.  She has a Guidant bi-  V ICD implanted for congestive heart failure, complete heart block and a  history of polymorphic VT in the setting of nonischemic heart disease.  The procedure was complicated by left upper extremity deep venous  thrombosis.   The patient presents to the office October 15 with her device at [facility].  The patient has no nocturnal dyspnea, no significant peripheral edema.  Device interrogated, shows that the atrial port is plugged.  The  benefits and risks of reimplantation of the device have been discussed.  The patient would like to  proceed.  The role of a change-out with CRT-P  versus D was explained and the patient would like to proceed with CRT-D.   HOSPITAL COURSE:  The patient presented electively October 27.  She  underwent explantation of the existing generator, which was at end of  life, and implant of a new Guidant Contak Renewal III cardioverter-  defibrillator generator by Dr. Sherryl Manges.  Patient discharging the  same day without any immediate postprocedural complications.  She is  asked to remove the bandage on the morning of Tuesday, October 20, and  leave the incision open to the air.  She is to keep her incision dry for  next 7 days and sponge-bathe until Monday, November 3,   She goes home with the following medications:  1. Coreg 12.5 mg tablets 1-1/2 tablets in the morning 1-1/2 tablets in      the evening.  2. Norvasc 5 mg 1-1/2 tablets twice daily.  3. Altace 5 mg daily.  4. Ferrex 150 mg daily.  5. Gabapentin 300 mg twice daily.  6. Nasonex as needed.  7. Lasix 20 mg tablets 2 tablets every other day.  8. Lanoxin 125 mcg daily.  9. Humalog Pen 20 units twice daily.  10,  a new medication, antibiotic Keflex, 250 mg one tablet four times  daily as directed.   She follows up with [facility], [address] ICD  clinic, Wednesday, November 5, at 9 o'clock.   Labs pertinent to this admission were drawn on October 21:  White cells  just 5.5, hemoglobin 11.6, hematocrit 32.8 and platelets are 125.  Pro  time was 11.4, INR 0.9.  Sodium is 142, potassium 4.3, chloride 106,  carbonate 31, glucose 146, BUN is 28, creatinine 1.1.      Maple Mirza, PA     GM/MEDQ  D:  07/12/2007  T:  07/12/2007  Job:  [id number]

## 2011-01-28 NOTE — H&P (Signed)
[name], [name]                    ACCOUNT NO.:  [id number]   MEDICAL RECORD NO.:  [id number]          PATIENT TYPE:  INP   LOCATION:  3737                         FACILITY:  [facility]   PHYSICIAN:  Noralyn Pick. Eden Emms, MD, FACCDATE OF BIRTH:  18-Jun-1924   DATE OF ADMISSION:  05/03/2009  DATE OF DISCHARGE:                              HISTORY & PHYSICAL   PRIMARY CARDIOLOGIST:  Pricilla Riffle, MD, [facility]   ELECTROPHYSIOLOGIST:  Duke Salvia, MD, [facility]   PRIMARY MEDICAL PHYSICIAN:  Audrie Gallus. Tower, MD   CHIEF COMPLAINT:  Chest pain/shortness of breath/nausea.   HISTORY OF PRESENT ILLNESS:  [name] is an 77 year old Caucasian female  with a known history of nonobstructive coronary artery disease per last  cath in 2001, chronic systolic heart failure with nonischemic  cardiomyopathy (last EF 45% per echo May 2008), complete AV block, S/P  Guidant ICD, CKD (stage III), hypertension, hyperlipidemia, and insulin-  dependent diabetes mellitus, presenting with atypical chest pain,  shortness of breath, and nausea.  The patient has noted mildly  increasing shortness of breath over the last 3 days, which she  attributed to the heat.  Early today at approximately 3:20 a.m., the  patient noted sudden onset of nausea and worsening of shortness of  breath.  She called her daughter and then noted sharp substernal chest  pain lasting only a few seconds.  After this, she had several episodes  of dry heaves without frank emesis.  Her blood pressure was checked and  found to be 88/59 with a heart rate of 48.  EMS was called, and the  patient was brought to [facility] ED for further eval.  Here,  she has been mildly tachypneic with respiration rate up to the low 30s,  but O2 saturation always greater than 95% on only 2 L by nasal cannula.  BP and heart rate back up to baseline of mild hypertension and within  normal limits respectively.  Chest x-ray shows low volume chest without  acute  cardiopulmonary disease.  EKG shows paced rhythm.  The patient  currently feeling above back to her baseline except for mild increase in  her chronic shortness of breath.   PAST MEDICAL HISTORY:  1. Coronary artery disease, nonobstructive.  2. Chronic systolic heart failure.  3. Nonischemic cardiomyopathy.  4. Complete atrioventricular block.      a.     S/P Guidant ICD (CONTAK RENEWAL III).  5. CKD, stage III.  6. Hypertension.  7. Hyperlipidemia.  8. Insulin-dependent diabetes mellitus.  9. Splenic artery aneurysm.  10.History of anemia.  11.Dark stools, on iron.  12.History of adenomatous colonic polyps.  13.Early satiety and anorexia.  14.Chronic UTIs.  15.Osteoarthritis.  16.Gout.  17.Osteopenia.  18.Allergic rhinitis.   SOCIAL HISTORY:  The patient is a nonsmoker.  Denies any EtOH, illicit  drug use, or herbal medications.  She follows a heart-healthy/ADA diet  and does not regularly exercise.   FAMILY HISTORY:  Negative for premature coronary artery disease;  otherwise, daughter has breast cancer, high cholesterol runs in her  family, and no family history of colon cancer.   REVIEW OF SYSTEMS:  Chronic bilateral knee pain, chronic orthopnea,  otherwise see HPI.  All other systems reviewed and were negative.   ALLERGIES:  1. CELEBREX.  2. ASPIRIN.  3. AVELOX.  4. TRICOR.  5. LIPITOR.  6. NIASPAN.  7. METFORMIN.   MEDICATIONS:  1. Altace 10 mg p.o. daily.  2. Coreg 19.75 mg p.o. b.i.d. (Coreg 12.5 mg one and a half tablets      p.o. b.i.d.).  3. Gabapentin 600 mg p.o. b.i.d.  4. Nexium 40 mg p.o. daily.  5. Simvastatin 20 mg p.o. nightly.  6. Furosemide 40 mg p.o. every other day.  7. Humalog insulin 75/25, 16 units b.i.d.  8. Lanoxin 0.125 mg one-half tablet p.o. daily.  9. Norvasc 2.5 mg p.o. b.i.d.  10.Vitamin D supplements.  11.Loperamide 2 mg p.r.n.   PHYSICAL EXAMINATION:  VITAL SIGNS:  Temperature 100.3 degrees  Fahrenheit, BP 129/42, pulse 87,  respiration rate 28, O2 saturation 96%  on room air.  GENERAL:  The patient is alert and oriented x3, in no apparent distress.  She has mild respiratory distress with full sentences.  HEENT:  Head is normocephalic and atraumatic.  Pupils equal, round, and  reactive to light.  Extraocular muscles are intact.  Nares are patent  without discharge.  The patient wears upper and lower dentures.  Oropharynx is without erythema or exudates.  NECK:  Supple without lymphadenopathy.  No thyromegaly.  No bruits.  JVD  up to her ears.  HEART:  Rate is regular with audible S1 and S2.  No clicks, rubs,  murmurs, or gallops.  Pulses are 2+ and equal in both upper and lower  extremities bilaterally.  LUNGS:  Mild crackles on the left base and decreased breath sounds on  the right base.  SKIN:  No rash, lesions, or petechiae.  ABDOMEN:  Soft, nontender, and nondistended.  Normal abdominal bowel  sounds.  No rebound or guarding.  No hepatosplenomegaly.  EXTREMITIES:  No clubbing, cyanosis, or edema.  MUSCULOSKELETAL:  No joint deformity or effusions.  No spinal or CVA  tenderness.  NEUROLOGIC:  Cranial nerves II through XII were grossly intact.  Strength 5/5 in all extremities and axial groups.  Normal sensation  throughout and normal cerebellar function.   RADIOLOGY:  1. Chest x-ray shows low volume chest without acute cardiopulmonary      disease.  2. A 2-D echocardiogram completed May 2008 shows LVEF equal to 45%      with hypokinesis of the periapical and septal walls, mild mitral      regurgitation, left atrium and right atrium mildly dilated.  3. EKG shows a paced rhythm with a right bundle-branch block, rate 88,      nonspecific ST-T wave changes, left axis deviation.  No evidence of      hypertrophy.  No intervals, PR 152, QRS 136, and QTc 496, and no      significant change from tracing completed September 2007.   LABORATORIES:  WBC 3.4, HGB 10.8, HCT 31.3, and PLT count 124.  Sodium  138,  potassium 3.5, chloride 106, CO2 of 24, BUN 26, creatinine 1.08,  glucose 182, and BNP less than 30.  Cardiac enzymes; CK 43, MB 1.9, and  troponin I 0.08.   ASSESSMENT AND PLAN:  Ms. Daniely is an 77 year old Caucasian female with  the above-mentioned medical history, who presents with a confusing  picture as her BNP is decreased.  Chest x-ray, no CHF, but symptoms  consistent with volume overload and JVP elevated.  We will check a D-  dimer as well as lower extremity duplex Dopplers, check urinalysis to  rule out sepsis, and 2-D echocardiogram for better understanding of the  patient's left ventricular/right ventricular function.  Transient  decrease in blood pressure raises question of vasovagal event, but  atypical picture.  We will admit to telemetry monitor and continue home  medications.      Jarrett Ables, PAC      Peter C. Eden Emms, MD, [facility]  Electronically Signed    MS/MEDQ  D:  05/03/2009  T:  05/03/2009  Job:  [id number]

## 2011-01-28 NOTE — Assessment & Plan Note (Signed)
[facility]                            CARDIOLOGY OFFICE NOTE   JORI, FRERICHS                           MRN:          [id number]  DATE:08/23/2007                            DOB:          October 01, 1923    IDENTIFICATION:  [name] is an 77 year old woman whom I follow in the  clinic.  She has a nonischemic cardiomyopathy.  She was last seen in  cardiology clinic by myself back in August.  She was seen by Dr. Berton Mount on October 15.  The patient is status post revision of her ICD  generator change out.  She now has a Consulting civil engineer.   Since this was placed with pacing, she has actually felt great.  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  Denies shortness of breath.  Able to do  what she wants to do.  Appetite is good.  No chest pain.   CURRENT MEDICATIONS:  1. Coreg 18.75 b.i.d.  2. Norvasc 2.5 b.i.d.  3. Altace 5.  4. Ferrex 50.  5. Gabapentin 600 b.i.d.  6. Nexium 40.  7. Nasonex spray.  8. Insulin.  9. Vitamin D.  10.Align.  11.Lasix 40 every other day.  12.Lanoxin.   PHYSICAL EXAM:  I cannot find blood pressure or pulse.  NECK:  JVP is normal.  LUNGS:  Clear.  CARDIAC:  Regular rate and rhythm.  S1, S2.  No S3.  ABDOMEN:  Benign.  EXTREMITIES:  No edema.   A 12-lead EKG shows dual-chamber pacing.   IMPRESSION:  1. Cardiomyopathy clinically looks very good and it sounds like her      symptoms have improved with pacing.  Would continue.  2. History of polymorphic ventricular tachycardia with implantable      cardioverter defibrillator.  3. History of diabetes.  4. Hypertension, adequately controlled.  5. Health care maintenance.  Will set the patient for fasting lipid      panel.   Otherwise, I would like to see the patient back in 4 months.  Sooner if  problems.     Pricilla Riffle, MD, [facility]  Electronically Signed    PVR/MedQ  DD: 08/23/2007  DT: 08/23/2007  Job #: [id number]   cc:   Marne A. Milinda Antis, MD

## 2011-01-28 NOTE — Assessment & Plan Note (Signed)
[facility]                            CARDIOLOGY OFFICE NOTE   Christina, [name]                           MRN:          [id number]  DATE:01/22/2007                            DOB:          Jan 30, 1924    CARDIOLOGIST:  Dr. Dietrich Pates.   ELECTRO-PHYSIOLOGIST:  Dr. Graciela Husbands.   PRIMARY CARE PHYSICIAN:  Marne A. Tower, MD   HISTORY OF PRESENT ILLNESS:  [name] is a 77 year old female patient  with a history of non-ischemic cardiomyopathy, an EF of 30-35%, improved  to 45-55%, the last electrocardiogram done September 2006, who is status  post Bi-V/ASCD implantation, who called the office recently with  complaints of increasing lower extremity edema and shortness of breath.  This has occurred over the last week.  She is added onto my schedule  today.  My nurse actually talked to Dr. Tenny Craw earlier today.  Dr. Tenny Craw  had already given some orders regarding medication changes, and those  were listed below.  The patient notes that she sleeps in a recliner.  She has done this for years, and there has been no change.  She denies  any paroxysmal nocturnal dyspnea.  Her weight has been fairly stable,  but her lower extremity edema has clearly gotten worse.  Dyspnea on  exertion has also gotten worse.  She denies chest pain.  She denies  syncope.   CURRENT MEDICATIONS:  Include:  1. Coreg 12.5 mg, two tablets twice daily.  2. Norvasc 5 mg, half tablet twice daily.  3. Altace 5 mg daily.  4. Ferrex 150 mg daily.  5. Gabapentin 300 mg, two tablets twice daily.  6. Nexium 40 mg daily.  7. Nasonex.  8. Insulin.  9. Vitamin D.  10.__________  4 mg daily.  11.Omni-pred eye drops.   ALLERGIES:  NO KNOWN DRUG ALLERGIES.   REVIEW OF SYSTEMS:  Please see HPI.  Denies any fever, chills, cough.  No hematochezia, hematuria, dysuria.   PHYSICAL EXAMINATION:  GENERAL:  She is well-nourished, well-developed  female in no acute distress.  VITAL SIGNS:  Blood pressure  150/72, pulse 70, weight 159 pounds.  This  is up 2 pounds since her last visit.  HEENT:  Unremarkable.  NECK:  Without obvious JVD.  CARDIAC:  Normal S1, S2.  Regular rate and rhythm, without murmurs.  LUNGS:  With decreased breath sounds, clear to auscultation bilaterally,  without wheezing, rhonchi or rales.  ABDOMEN:  Soft, nontender with normoactive bowel sounds, no  organomegaly.  EXTREMITIES:  2+ edema bilaterally.  She does have prominent superficial  veins in her posterior calves.  Her calves are just mildly tender.  There is no erythema.  There are no deep palpable cords bilaterally.  I  also had Dr. Antoine Poche examine her legs, and he agreed with this  assessment.   IMPRESSION:  1. Mild acute on-chronic systolic congestive heart failure.  2. Non-ischemic cardiomyopathy with an EF of 30-35%, improved at 45-      55% by echocardiogram September 2006.  3. History of acute renal failure secondary to hemodynamic  acute      tubular necrosis.      a.     Recent creatinine of 0.99 in January of 2008.  4. History of polymorphic ventricular tachycardia status post AICD      implantation.      a.     Status post upgrade to bi-ventricular device 2005.      b.     History of atrial lead malfunction, secondary to lead       failure.  5. History of complete heart block status post permanent pacemaker      implantation.  6. History of renal carcinoma status post partial right nephrectomy.  7. Diabetes mellitus type 2.  8. Hypertension.  9. Anemia.  10.History of dementia with sundowning.  11.Gout.  12.Dyslipidemia.  13.History of hysterectomy.  14.History of cholecystectomy.  15.Lower extremity edema.   PLAN:  [name] presents to the office today with symptoms consistent  with mild acute on-chronic systolic congestive heart failure.  My nurse  had already spoken to Dr. Tenny Craw, who suggested that the patient decrease  her Coreg to 18.75 mg twice a day and started on Lasix 40 mg every  other  day.  She also recommended getting a BMET and a BNP, as well as  repeating her echocardiogram.  We have asked her to increase her dietary  potassium.  Given the patient's abnormal exam with her prominent  superficial veins in her lower extremities, I have also recommended that  we proceed with bilateral venous Dopplers throughout possible DVT.  This  is unlikely, as she has bilateral swelling, and there are no deep  palpable cords.  We will go ahead and repeat a BMET early next week, to  follow up on a renal function potassium, given her history of acute  renal failure.  She knows to contact us, if her symptoms should change  or worsen, between now and the time we see her back.  I have asked her  to follow up in one  week's time.  Dr. Tenny Craw is not here next week, but she can certainly see  one of the physician extenders.      Tereso Newcomer, PA-C       Rollene Rotunda, MD, [facility]    SW/MedQ  DD: 01/22/2007  DT: 01/23/2007  Job #: [id number]   cc:   Marne A. Milinda Antis, MD

## 2011-01-28 NOTE — Assessment & Plan Note (Signed)
[facility]                         ELECTROPHYSIOLOGY OFFICE NOTE   [name], [name]                           MRN:          [id number]  DATE:06/30/2007                            DOB:          1924/08/19    [name] is seen as her defibrillator has reached ERI.   She has a previously implanted Guidant CRT-D for congestive heart  failure, complete heart block, polymorphic VT in the setting of  nonischemic heart disease.   Her procedure was complicated by left upper extremity deep venous  thrombosis, and so repositioning was not undertaken.   She now comes in at [facility].   She has had some variability in her blood pressure.  Her breathing has  been relatively stable, though.  There has been no nocturnal dyspnea or  significant peripheral edema.   Her current medications include:  1. Coreg 18.75 b.i.d.  2. Norvasc 2.5 b.i.d.  3. Ferrex insulin.  4. Lanoxin 0.0625.  5. Furosemide 40 every other day.   EXAMINATION:  Her blood pressure is 136/69 with a pulse of 71.  LUNGS:  Clear.  Her neck veins were 6-7 cm.  Her heart sounds were regular without murmurs.  EXTREMITIES:  1+ edema.   Interrogation of her Guidant ERICD demonstrates that her atrial port is  plugged.  Her RV threshold at last check was 0.6 at 0.5.  The LV  threshold was 4 at 0.5.  High voltage impedance was 42, and LV impedance  was 694, and RV was 967.   IMPRESSION:  1. Nonischemic cardiomyopathy.  2. Polymorphic ventricular tachycardia.  3. Atrial fibrillation permanent.  4. Complete heart block.  5. Status post CRT-D for the above now elective replacement indicator.   We had a lengthy discussion regarding end of life issues.  We discussed  the potential benefits as well as the potential risks of device  generator replacement, and she would like to proceed.  We discussed the  role of change-out with a CRT-P versus D.  She would like to proceed  with a CRT-D implant  notwithstanding her age and comorbid conditions.   I think this is reasonable.   We will plan to schedule this as possible.     Duke Salvia, MD, [facility]  Electronically Signed    SCK/MedQ  DD: 06/30/2007  DT: 07/01/2007  Job #: [id number]

## 2011-01-28 NOTE — Assessment & Plan Note (Signed)
[facility]                            CARDIOLOGY OFFICE NOTE   [name], [name]                           MRN:          [id number]  DATE:02/03/2007                            DOB:          July 29, 1924    CARDIOLOGIST:  Dr. Dietrich Pates.   EP PHYSICIAN:  Dr. Graciela Husbands.   PRIMARY CARE PHYSICIAN:  Dr. Roxy Manns.   HISTORY OF PRESENT ILLNESS:  [name] is a delightful 77 year old female  patient followed by Dr. Tenny Craw with a history of nonischemic  cardiomyopathy, whom I saw on Jan 22, 2007 with symptoms of acute on  chronic systolic congestive heart failure.  We increased her Lasix and  decreased her Coreg, and had her follow up today.  She was supposed to  get a BMET and a BNP.  I cannot find them in the system anywhere, but  her followup BMET on May 14 revealed a potassium of 4.3, BUN 37,  creatinine 1.2.  She does have a past history of acute renal failure  secondary to hemodynamic acute tubular necrosis.  She returns today for  followup.  She notes that she is doing much better.  Her breathing with  exertion is improved.  She probably has class IIB to III symptoms now.  She denies chest pain.  Denies any syncope or near-syncope.  Her  swelling is improved.  She denies cough.   CURRENT MEDICATIONS:  1. Coreg 12.5 mg 1.5 tablets b.i.d.  2. Norvasc 5 mg 1/2 tablet b.i.d.  3. Altace 5 mg daily.  4. Ferrex 150 mg daily.  5. Gabapentin 300 mg 2 tablets b.i.d.  6. Nexium 40 mg daily.  7. Nasonex.  8. Insulin pen.  9. Vitamin D.  10.Allgn 4 mg daily p.r.n.  11.Furosemide 20 mg 2 tablets every other day.  12.Lanoxin 0.125 mg 1/2 tablet daily.   ALLERGIES:  No known drug allergies.   PHYSICAL EXAM:  She is a well-nourished, well-developed female in no  acute distress.  Blood pressure 138/64, pulse 65, weight 158 pounds.  This is down 1  pound since we last saw her.  HEENT:  Normal.  NECK:  Without JVD at 90 degrees.  CARDIAC:  Normal S1, S2.  Regular  rate and rhythm.  LUNGS:  Clear to auscultation bilaterally.  ABDOMEN:  Soft.  EXTREMITIES:  With trace edema bilaterally.  Calves are soft and  nontender.  SKIN:  Warm and dry.  NEUROLOGIC:  She is alert and oriented x3.  Cranial nerves 2-12 are  grossly intact.   DATABASE:  BMET Jan 27, 2007 as noted above.  Lower extremity Dopplers  done Feb 03, 2007 negative for DVT bilaterally.  Incidental finding of  leaking cyst at the popliteal fossa.  A 2D echocardiogram Jan 27, 2007  shows EF 45%, hypokinesis of the periapical wall, hypokinesis of the  septal wall, mild mitral regurgitation.  Left atrium mildly dilated.  Right atrium mildly dilated.   IMPRESSION:  1. Acute on chronic systolic congestive heart failure - compensated.      a.  New York Heart Association class IIB to III symptoms.  2. Nonischemic cardiomyopathy with a previous ejection fraction of 30-      35%.      a.     Improved to 45% by recent echocardiogram.  3. History of acute renal failure secondary to hemodynamic acute      tubular necrosis.      a.     Recent creatinine 1.2.  4. History of polymorphic ventricular tachycardia status post      automatic implantable cardioverter defibrillator.      a.     Status post upgrade to cardiac resynchronization therapy       device in 2005.      b.     History of atrial lead malfunction secondary to lead       failure.  5. History of complete heart block status post permanent pacemaker      implantation.  6. History of renal carcinoma status post right partial nephrectomy.  7. Diabetes mellitus.  8. Hypertension.  9. History of anemia.  10.Dyslipidemia.  11.Please see the problem list from Jan 22, 2007.   PLAN:  The patient presents to the office today for followup for her  acute on chronic systolic congestive heart failure.  She is  symptomatically improved.  She does have a history of acute renal  failure and we will monitor her renal function very closely.  I  have  recommended a followup BMET and BNP level today.  A recent  echocardiogram reveals an EF of 45%.  This is fairly similar to her  previous EF done in 2006.  We will leave all of her medications the same  at this  point in time.  She was supposed to follow up with Dr. Tenny Craw some time in  September.  We will bring her back at Dr. Tenny Craw' next available  appointment.      Tereso Newcomer, PA-C  Electronically Signed      Luis Abed, MD, [facility]  Electronically Signed   SW/MedQ  DD: 02/04/2007  DT: 02/04/2007  Job #: [id number]   cc:   Marne A. Milinda Antis, MD

## 2011-01-28 NOTE — Assessment & Plan Note (Signed)
 [facility]                            CARDIOLOGY OFFICE NOTE   ALVIRA, HECHT                           MRN:          [id number]  DATE:04/16/2007                            DOB:          1924/03/23    IDENTIFICATION:  [name] is an 77 year old woman with a nonischemic  cardiomyopathy. She was last seen in clinic by Tereso Newcomer on May 21.  At that time, she was just recovering from some volume overload and her  Coreg was backed down to 18.75 daily.   In the interval since she was seen, she has done well. She notes her  activity is up. She wants to help out more. She is breathing okay. Her  blood pressure is a little higher when it is hot. Blood pressure records  show a blood pressure ranging from the 120s systolic to 160s. Denies  chest pain. No PND. Appetite is good.   CURRENT MEDICATIONS:  1. Coreg 18.5 b.i.d.  2. Norvasc 2.5 b.i.d.  3. Altace 5 daily.  4. __________ 150.  5. Gabapentin 600 b.i.d.  6. Nexium 40.  7. Nasonex spray.  8. Insulin p.r.n.  9. Vitamin D.  10.Align.  11.Lasix 40 q.o.d.  12.Lanoxin 0.0625 daily.   PHYSICAL EXAMINATION:  The patient is in no distress. Blood pressure  116/61, pulse 71, weight 162.  NECK: JVP is normal.  LUNGS:  Clear moving air.  CARDIAC: Regular rate and rhythm, S1, S2. No S3. No significant murmurs.  ABDOMEN: Benign.  EXTREMITIES: No edema.   IMPRESSION:  1. Cardiomyopathy, volume looks good. I would not change her      medications for now. Blood pressure is a little high at times, but      she is doing very well clinically. I think her reserve is low.  2. History of polymorphic VT status post implantable cardioverter      defibrillator (ICD).  Has cardiac resynchronization therapy in      2005. History of atrial lead malfunction secondary to lead failure.  3. History of complete heart block, status post pacer.  4. History of renal cell cancer status post partial nephrectomy.  5.  Diabetes.  6. Hypertension.  7. Anemia.  8. Dyslipidemia.   I will set followup later in the fall. Continue on current regimens.  Call if problems.     Pricilla Riffle, MD, [facility]  Electronically Signed    PVR/MedQ  DD: 04/16/2007  DT: 04/16/2007  Job #: [id number]

## 2011-01-28 NOTE — Progress Notes (Signed)
[facility]                  [facility]' OFFICE NOTE   HAZELINE, CHARNLEY                           MRN:          [id number]  DATE:10/18/2007                            DOB:          1924/06/19    REASON FOR VISIT:  Mrs. Khanna has done beautifully since device generator  replacement in October, something for which I cannot account.  In any  case, she is walking, breathing better, more active.  No medications  change or other changes were made.  The device was not reprogrammed but  still ...   MEDICATIONS:  Her medications remain the same at Coreg 18.75, Norvasc  2.5, Altace 5, insulin, furosemide 40 every other day and Lanoxin 0.625.   PHYSICAL EXAMINATION:  VITAL SIGNS:  On examination, her blood pressure  today was 177/71.  On repeat was 174, pulse was 74.  This was markedly  anomalous from blood pressures that we have seen before, 136/69 and  orthostatic to 85/50.  LUNGS:  Clear.  HEART:  Sounds were regular.  EXTREMITIES:  Were without edema.   Interrogation of her Guidant ICD demonstrates a P wave of 3.3 with  impedance of 391, a threshold of 0.6 at 0.4.  The RV impedance was 942  with threshold of 0.4 at 0.5 and the LV impedance was 646 with threshold  of 0.4 and at 0.1  She is 100% ventricularly paced.   IMPRESSION:  1. Complete heart block.  2. Nonischemic cardiomyopathy.  3. Polymorphic ventricular tachycardia.  4. Status post CRTD for the above.   DISCUSSION:  Mrs. Perrone is doing really quite well, something I cannot  explain but I certainly am grateful for.  We will see her again as part  of her routine followup and she will followup with Dr. Tenny Craw as  previously scheduled.  I will need to review this with Dr. Dietrich Pates.  We will see her again in six months' time and will follow her remotely  in the interim.     Duke Salvia, MD, [facility]  Electronically Signed    SCK/MedQ  DD: 10/18/2007  DT: 10/19/2007  Job #:  [id number]   cc:   Pricilla Riffle, MD, [facility]

## 2011-01-28 NOTE — Progress Notes (Signed)
[facility]                  [facility] OFFICE NOTE   [name], [name]                           MRN:          [id number]  DATE:10/23/2008                            DOB:          05/22/1924    [name] is seen in followup for nonischemic cardiomyopathy, previously  implanted ICD, and device dependence.  She has had no recurrent  ventricular tachycardia.  She has no complaints of chest pain or  shortness of breath as long as she does not go too fast, and she has had  no problems with edema.   Medications include:  1. Coreg 12.5 b.i.d.  2. Norvasc 5.  3. Altace 10.  4. Gabapentin 300 b.i.d.  5. Lanoxin 0.0625.  6. Furosemide 40 every other day.  7. Simvastatin.   On examination, her blood pressure was 148/60, pulse of 60, but the  review of the home blood pressures last month or two have all been in  the 120s.  Neck veins were flat.  The lungs were clear.  The heart  sounds were regular without murmurs.  The extremities were without  edema.   Interrogation of her ICD demonstrates a Guidant H175 with a P-wave of  2.9, impedance of 394, threshold 0.8 at 0.4.  There was no intrinsic  ventricular rhythm.  The RV impedance was 960 with threshold of 0.4 at  0.5 in the LV impedance was 664 and the threshold of 4 V at 1.   IMPRESSION:  1. Congestive heart failure - chronic - systolic.  2. Nonischemic cardiomyopathy.  3. Status post cardiac resynchronization therapy device and      defibrillator for the above.  4. Complete heart block.   [name] is doing quite well.  Plan to see her again in 1 year's time.  She will be followed remotely in the interim.     Duke Salvia, MD, [facility]  Electronically Signed    SCK/MedQ  DD: 10/23/2008  DT: 10/24/2008  Job #: [id number]

## 2011-01-28 NOTE — Op Note (Signed)
NAMEMAJORIE, SANTEE NO.:  [id number]   MEDICAL RECORD NO.:  [id number]          PATIENT TYPE:  INP   LOCATION:  2852                         FACILITY:  [facility]   PHYSICIAN:  Duke Salvia, MD, FACCDATE OF BIRTH:  August 25, 1924   DATE OF PROCEDURE:  07/12/2007  DATE OF DISCHARGE:                               OPERATIVE REPORT   PREOPERATIVE DIAGNOSES:  1. Ischemic heart disease.  2. Congestive heart failure.  3. Previously implanted CRT device, now at [facility].   POSTOPERATIVE DIAGNOSES:  1. Ischemic heart disease.  2. Congestive heart failure.  3. Previously implanted CRT device, now at [facility].   PROCEDURE:  Dual chamber defibrillator explantation with implantation of  a new lead and intraoperative defibrillation threshold testing.   Following obtaining informed consent and a lengthy discussion with the  patient and her family regarding the appropriateness of ICD  reimplantation in this relatively ill 77 year old woman, the patient was  brought to the electrophysiology laboratory and prepped and draped in a  routine fashion.  Lidocaine was infiltrated along the line of the  previous incision and carried down to a layer of the device pocket using  sharp dissection and electrocautery.  The pocket was opened.   The previously implanted device was explanted.  The new device was  implanted.  Interrogation of the previously implanted system  demonstrated that the underlying only pacing configuration that worked  was LV tip to RV coil.  No other configurations paced.  The threshold in  this configuration was 5.6 and 0.5.   The RV pacing threshold was 0.5 and 0.5 with an RV impedance of 850.   These leads were freed up from the previously implanted device and  attached to a Guidant Contact Renewal III H-175 device, [id number].  Through  the device, it turned out that the atrial lead worked and that it must  have failed to insert it adequately in the previous device.  The  P-wave  was 3.9 with a pacing impedance of 413 and a threshold of 0.4 and 0.5.  The RV impedance was 960 with a threshold 0.6 and 0.5 and the LV  threshold at 1 millisecond was 4 volts with a pacing impedance of 664.  The high-voltage impedance was 44 ohms.   With these successful parameters recorded, defibrillation threshold  testing was undertaken.  Ventricular fibrillation was induced via the T-  wave shock after a total duration of 7 seconds.  A 17-joule shock was  delivered through a measured resistance of 38 ohms, terminating  ventricular fibrillation and restoring sinus rhythm.  The device was  implanted.  The pocket was copiously irrigated with antibiotic  containing saline solution.  Prior to this, hemostasis was assured.  The  leads and the pulse generator were then placed in the pocket.  The wound  was closed in 3 layers using Monocryl suture.  The patient tolerated the  procedure without apparent complication.      Duke Salvia, MD, [facility]  Electronically Signed     SCK/MEDQ  D:  07/12/2007  T:  07/12/2007  Job:  [id number]

## 2011-01-28 NOTE — Assessment & Plan Note (Signed)
[facility]                            CARDIOLOGY OFFICE NOTE   DENISA, ENTERLINE                           MRN:          [id number]  DATE:02/28/2008                            DOB:          Dec 26, 1923    IDENTIFICATION:  Ms. Berenguer is an 77 year old woman.  She has a nonischemic  cardiomyopathy, also hypertension and dyslipidemia.  I last saw her in  April 2009.   When I saw her last, her blood pressure was increased, and I increased  her Norvasc to 10 (5 b.i.d.) and Altace 10.  Kept her Coreg the same.   In the interval, she has done pretty well.  She feels good actually.  I  think she feels better, doing a little more walking, less short of  breath.  Daughter brings in the blood pressures that range anywhere from  the 90s to 150s, but it looks like the average is in the 120s to 130s.  Today, her blood pressure is 143/62, pulse is 71, and weight 163.  LUNGS:  Clear.  CARDIAC:  Regular rate and rhythm, S1 and S2, no S3.  ABDOMEN:  Benign.  EXTREMITIES:  No edema.   MEDICATIONS:  1. Coreg 12.5 mg 1-1/2 tabs b.i.d.  2. Norvasc 5 b.i.d.  3. Gabapentin 600 b.i.d.  4. Nexium 40.  5. Insulin.  6. Lasix 40 q.i.d.  7. Lanoxin 0.0625 daily.  8. Zocor 20.  9. Altace 10.  10.Poly Iron.   IMPRESSION:  1. Hypertension, better, would continue.  2. Cardiomyopathy.  Volume status looks good.  By echo in 2008, LV      function was at 45%.  3. Dyslipidemia, on Zocor.  4. Diabetes.  Continue.   I will set to see the patient back in the fall or sooner if problems  develop.    Pricilla Riffle, MD, [facility]  Electronically Signed   PVR/MedQ  DD: 02/28/2008  DT: 02/29/2008  Job #: [id number]   cc:   Marne A. Milinda Antis, MD

## 2011-01-28 NOTE — Assessment & Plan Note (Signed)
[facility]                            CARDIOLOGY OFFICE NOTE   COLETA, GROSSHANS                           MRN:          [id number]  DATE:01/03/2008                            DOB:          [birth year]    IDENTIFICATION:  Ms. Pollinger is an 77 year old woman.  She has a nonischemic  cardiomyopathy.  She is also followed by Dr. Sherryl Manges.  She actually  was last seen by him back in February.  See this note for full details.   Since I saw her back in December.  She has done very well.  She says  overall she is feeling great.  She did have one where she had a very bad  headache and her blood pressure was elevated in the 190 range.  It  gradually came down in the morning.  She has not had anymore spells like  this, although at times, her blood pressure had been in the 160s.   CURRENT MEDICATIONS:  1. Coreg 12.5 mg 1/2 tablet b.i.d.  2. Norvasc 2.5 b.i.d.  3. Altace 5.  4. Ferrex 150.  5. Gabapentin 600 b.i.d.  6. Nexium 40 daily.  7. Insulin b.i.d.  8. Lasix 40 daily every other day.  9. Lanoxin 0.0625.  10.Zocor 20.   PHYSICAL EXAMINATION:  GENERAL:  The patient is in no distress.  VITA SIGNS:  Blood pressure 175/76.  Pulse is 76 and regular.  Weight  162.  LUNGS:  Clear.  NECK:  JVP is normal.  CARDIAC:  Regular rate and rhythm, S1 and S2, no S3.  ABDOMEN:  Benign.  EXTREMITIES:  No edema.   IMPRESSION:  1. Cardiomyopathy.  Volume status looks good.  Her blood pressure is      elevated.  For now, I would increase her Altace and Norvasc 10      daily and 5 b.i.d.  May indeed increase Coreg as well, but would      hold off on this for now.  Note, her LV function by echocardiogram      in Jan 15, 2007, was 45%.  2. Hypertension, as noted above.  3. Dyslipidemia.  She was recently started on Zocor.  LDL now is 59.      HDL of 30, triglycerides elevated at 267.  Will check a BMET and      hemoglobin A1c today.  She is on insulin which may not be  enough.      If her triglycerides came down, HDL should improve.  4. Diabetes, on insulin.   PLAN:  I will set to see the patient back actually in about 4-6 weeks to  follow her blood pressure.  Again, she should keep records as she has  done.     Pricilla Riffle, MD, [facility]  Electronically Signed    PVR/MedQ  DD: 01/04/2008  DT: 01/04/2008  Job #: [id number]   cc:   Marne A. Milinda Antis, MD

## 2011-01-28 NOTE — Discharge Summary (Signed)
[name], PARRELLA                    ACCOUNT NO.:  [id number]   MEDICAL RECORD NO.:  [id number]          PATIENT TYPE:  INP   LOCATION:  3737                         FACILITY:  [facility]   PHYSICIAN:  Noralyn Pick. Eden Emms, MD, FACCDATE OF BIRTH:  1924/02/17   DATE OF ADMISSION:  05/03/2009  DATE OF DISCHARGE:  05/06/2009                               DISCHARGE SUMMARY   PRIMARY CARDIOLOGIST:  Pricilla Riffle, MD, [facility]   ELECTROPHYSIOLOGIST:  Duke Salvia, MD, [facility]   PRIMARY CARE Edessa Jakubowicz:  Audrie Gallus Tower, MD   GASTROENTEROLOGIST:  Iva Boop, MD, [facility]   DISCHARGE DIAGNOSIS:  Dyspnea with nausea.   SECONDARY DIAGNOSES:  1. Elevated liver function tests.  2. History of nonobstructive coronary artery disease.  3. Chronic systolic congestive heart failure.  4. Nonischemic cardiomyopathy.  5. History of complete heart block status post Guidant Contak Renewal      III implantable cardioverter-defibrillator.  6. Stage III chronic kidney disease.  7. Hypertension.  8. Hyperlipidemia.  9. Insulin-dependent diabetes mellitus.  10.History of splenic artery aneurysm.  11.History of anemia.  12.History of dark stools, on iron therapy.  13.History of colonic polyps.  14.History of early satiety, anorexia.  15.Chronic urinary tract infections.  16.Osteoarthritis.  17.Gout.  18.History of osteopenia.  19.Allergic rhinitis.   ALLERGIES:  CELEBREX, ASPIRIN, AVELOX, TRICOR, LIPITOR, NIASPAN,  METFORMIN.   PROCEDURES:  1. Ventilation/perfusion scan which was low probability for pulmonary      embolus.  2. Abdominal ultrasound showing dilated central intrahepatic bile      ducts, common bile duct, and pancreatic duct.  Normal kidneys.  3. A 2-D echocardiogram showed ejection fraction of 55-60% with grade      1 diastolic dysfunction.  Mild pulmonary arterial hypertension and      trivial pericardial effusion.   HISTORY OF PRESENT ILLNESS:  An 77 year old Caucasian female with prior  history of nonischemic cardiomyopathy and nonobstructive CAD who was in  her usual state of health until approximately 3 days prior to admission  when she began to experience increasing shortness breath.  On the  morning of admission, she awoke with sudden onset of nausea and  worsening dyspnea as well as sharp chest pain followed by emesis.  Her  blood pressure was checked at home and she was hypotensive with a heart  rate of 48 and EMS was activated.  The patient was taken to [facility]  where she was tachypneic with normal oxygen saturation.  Chest x-ray  showed low lung volumes without acute cardiopulmonary process, and  cardiac markers were negative.  She was noted to have markedly elevated  liver function tests with a total bilirubin 1.8, alkaline phosphatase  213, AST of 976, ALT of 736.  She also had elevated D-dimer at 4.02.  The patient was admitted for further evaluation.   HOSPITAL COURSE:  The patient ruled out for MI.  A V/Q scan was  performed, which showed low probability for PE.  A 2-D echocardiogram  was performed showing normal LV function.  Secondary to elevated liver  function testing, Gastroenterology was consulted and they recommended  abdominal ultrasound.  This was performed and revealed dilated central  intrahepatic bile ducts, common bile duct, and pancreatic duct.  There  was no obvious obstruction and a clinical correlation was recommended.  Gastroenterology did not feel that she required ERCP at this time and  that as her LFTs have been trending in a downward fashion since  admission, they recommended clinical followup and outpatient evaluation  of LFTs.  They felt that her symptoms were most likely related to mild  ischemic hepatitis or a passed stone.   From a cardiac standpoint, we did discontinue Ms. Batie's digoxin in the  setting of her nausea with vomiting.  She will, otherwise, be discharged  home today in good condition, and we will arrange followup  with [facility] in approximately 2 weeks.   DISCHARGE LABORATORY DATA:  Hemoglobin 10.8, hematocrit 31.3, WBC 3.4,  platelets 124.  INR 1.1.  D-dimer 4.02.  Sodium 139, potassium 4.4,  chloride 97, CO2 32, BUN 32, creatinine 0.3, glucose 141, total  bilirubin 0.7, alkaline phosphatase 167, AST 37, ALT 209, total protein  6.9, albumin 3.4, calcium 10.1, ammonia 21, CK 49, MB 2.9, troponin-I  0.04.  Urinalysis did show positive nitrites with a small leukocytes,  many squamous epithelial, many bacteria.  Urine culture showed multiple  bacterial morphotypes suggesting contamination.   DISPOSITION:  The patient will be discharged home today in good  condition.   FOLLOWUP PLANS AND APPOINTMENTS:  We will arrange followup with Dr.  Leone Payor in approximately 2 weeks.  We will also arrange for followup  with Dr. Tenny Craw in 2-3 weeks.   DISCHARGE MEDICATIONS:  1. Amlodipine 5 mg half a tablet b.i.d.  2. Carvedilol 12.5 mg half a tablet b.i.d.  3. Colon Health Probiotic 1 capsule daily.  4. Humalog/lispro 25 units q.a.m.  Humalog/lispro 30 units q.p.m.  5. Lasix 20 mg 2 tablets daily.  6. Loperamide 2 mg daily p.r.n.  7. Neurontin 300 mg 2 caplets b.i.d.  8. Nexium 40 mg daily.  9. Ramipril 10 mg daily.  10.Vitamin D3, 1000 units daily.  11.Zocor 20 mg nightly.  12.We have recommended she discontinue digoxin.   OUTSTANDING LABORATORY DATA AND STUDIES:  She will be following up LFTs  per Gastroenterology.   DURATION OF DISCHARGE ENCOUNTER:  40 minutes including physician time.      Nicolasa Ducking, ANP      Noralyn Pick. Eden Emms, MD, [facility]  Electronically Signed    CB/MEDQ  D:  05/06/2009  T:  05/07/2009  Job:  [id number]   cc:   Iva Boop, MD,FACG  Audrie Gallus Milinda Antis, MD

## 2011-01-28 NOTE — Assessment & Plan Note (Signed)
[facility]                         ELECTROPHYSIOLOGY OFFICE NOTE   [name], [name]                           MRN:          [id number]  DATE:07/21/2007                            DOB:          Jan 14, 1924    [name] was seen today in the clinic on July 21, 2007 for a wound  check of her newly upgraded Guidant model No. H175.  Date of implant was  July 12, 2007 for nonischemic cardiomyopathy.  On interrogation of  her device today, her battery voltage is 3.22 with a charge time of 8.2  seconds.  P and R waves were not measured.  She is pacemaker dependent  to a rate of 30.  Atrial pacing threshold was less than 0.2 volts at 2  Msec, 4 volts at one Msec and 1.6 volts at 0.4 Msec, with an atrial lead  impedance of 394 ohms.  Right ventricular pacing threshold was 0.4 volts  at 0.5 Msec with a right ventricular lead impedance  of 910 ohms.  Left  ventricular lead impedance  was 4 volts at 1 Msec with a left  ventricular lead impedance  of 683 ohms.  There was one induced episode  at the time of implant.  No other episodes noted.  I did increase her  atrial output to 2.6 volts at 0.4 Msec.  Her Steri-Strips were removed  today.  Her wound is without redness.  It also should be noted that  these are all chronic leads put in in 2003, with her LV lead going in in  2005.  She will return to see [place] in the [place] office in 3 month's  time.      Altha Harm, LPN  Electronically Signed      Duke Salvia, MD, [facility]  Electronically Signed   PO/MedQ  DD: 07/21/2007  DT: 07/21/2007  Job #: [id number]

## 2011-01-28 NOTE — Assessment & Plan Note (Signed)
[facility]                            CARDIOLOGY OFFICE NOTE   ALIEYAH, SPADER                           MRN:          [id number]  DATE:07/24/2008                            DOB:          17-Feb-1924    IDENTIFICATION:  Ms. Stlouis is an 77 year old woman with nonischemic  cardiomyopathy, hypertension, dyslipidemia.  I last saw her in June  2009.   In the interval, she has done okay.  She says she does get short of  breath if she tries to do too much.  Her daughter says on the phone  sometimes she short of breath, maybe a little worse than last time,  sleeping very well, appetite good.  No chest pain.  No dizziness.   CURRENT MEDICINES:  1. Coreg 12.5 one and half tablet b.i.d.  2. Norvasc 2.5 b.i.d. question daily.  3. Altace 10.  4. Poly-Iron and gabapentin 300 two b.i.d.  5. Nexium 40.  6. Humalog insulin.  7. Lanoxin 0.0625 daily.  8. Lasix 40 every other day.  9. Simvastatin 40 nightly.  10.Loperamide p.r.n.  11.Colon Health probiotic.   PHYSICAL EXAMINATION:  GENERAL:  On exam, the patient is in no distress.  VITAL SIGNS:  Blood pressure 149/63 (records from home showed the blood  pressure 130-150.  Note, they were taken before she  takes any of her  medicines), pulse is 70 and regular, weight 164.  NECK:  No JVD.  LUNGS:  Clear without rales.  CARDIAC:  Regular rate and rhythm, S1and S2, no S3.  ABDOMEN:  No hepatomegaly.  EXTREMITIES:  No edema.   A 12-lead EKG, dual-chamber pacer.   IMPRESSION:  1. Nonischemic cardiomyopathy.  Volume status overall looks good.  I      would keep her on the same regimen.  History of implantable      cardioverter-defibrillator, follow up when in clinic.  2. Hypertension.  I am not eager to change medicines.  She has been      lower in the past in fact all summer her blood pressure was in the      120-130 range.  She has been at the 90s.  I have asked her to take      her pressure after she takes  her medicines and probably get a      better idea of what it really is running most of the day.  She can      follow up in the spring.  3. Dyslipidemia, on Zocor.  We will need to have lipids check when she      gets this checked, I      will also check electrolytes.  4. Diabetes.  Continue to follow up her primary care.     Pricilla Riffle, MD, [facility]  Electronically Signed    PVR/MedQ  DD: 07/24/2008  DT: 07/25/2008  Job #: [id number]   cc:   Marne A. Milinda Antis, MD

## 2011-01-31 NOTE — Assessment & Plan Note (Signed)
[facility]                         ELECTROPHYSIOLOGY OFFICE NOTE   SOREN, PIGMAN                           MRN:          [id number]  DATE:10/05/2006                            DOB:          1923/11/23    [name] was seen today in the clinic on October 05, 2006 for followup of  her Guidant, model #H175, Contact.  Date of implant was September 27, 2003  for nonischemic cardiomyopathy and complete heart block.   On interrogation of her device today, her battery voltage is 2.56 which  is middle of life 2, with about 25% remaining, and a charge time of 9.2  seconds.  P waves measured 4 millivolts, with an atrial lead impedance  of greater than 3000 ohms and no capture threshold at 5 volts at 1  millisecond.  This is all consistent going back as far as 2005.  R waves  were not measured.  She is pendent in the ventricle with a right  ventricular pacing threshold of 0.6 volts at 0.5 milliseconds and a  right ventricular lead impedance of 804 ohms.  Left ventricular lead  impedance was 606 ohms with a left ventricular pacing threshold of 4  volts at 0.5 milliseconds.  Shock impedance was 42 ohms.  There were no  episodes since last interrogation.  No changes were made in her  parameters.  She sends a monthly Latitude transmission because of the  issues on her atrial lead and her battery voltage being middle of life  2, with a return office visit in one year's time.      Altha Harm, LPN  Electronically Signed      Duke Salvia, MD, [facility]  Electronically Signed   PO/MedQ  DD: 10/05/2006  DT: 10/05/2006  Job #: [id number]

## 2011-01-31 NOTE — Procedures (Signed)
[place]  Patient:    [name], [name] Visit Number: [id number] MRN: [id number]          Service Type: MED Location: [phone] Attending Physician:  Mirian Mo Dictated by:   Nathen May, M.D., [facility] Admit Date:  03/29/2002 Discharge Date: 04/01/2002                             Procedure Report  PREOPERATIVE DIAGNOSES:  Syncope with complete heart block, torsade de points, cardiomyopathy, and prior syncope.  POSTOPERATIVE DIAGNOSES:  Syncope with complete heart block, torsade de points, cardiomyopathy, and prior syncope.  PROCEDURE:  Dual-chamber defibrillator implantation with intraoperative defibrillation threshold testing.  DESCRIPTION OF PROCEDURE:  Following the obtaining of informed consent, the patient was brought to the electrophysiology laboratory and placed on the fluoroscopic table in supine position.  After the routine prep and drape of the left upper chest, lidocaine was infiltrated in the prepectoral subclavicular region.  An incision was made and carried down to the layer of the prepectoral fascia using electrocautery.  A pocket was formed using electrocautery and sharp dissection.  However, because of the inhibition of the pacemaker by the cautery, most of the dissection was done without cautery and, in fact, the pocket was left to be made until after the ventricular lead was placed.  Following the partial formation of the pocket and following intravenous contrast being injected via the left antecubital vein to identify its course and patency, attention was then turned to gaining access to the extrathoracic left subclavian vein, which was accomplished without difficulty without the aspiration or air or puncture of the artery.  Two separate venipunctures were accomplished, and a 0 silk suture was placed in a figure-of-eight fashion and allowed to hang loosely.  Subsequently a 9 French tear-away  introducer sheath was placed, through which was passed a Guidant model 0158 dual-coil defibrillator lead, serial number [id number].  Under fluoroscopic guidance it was manipulated to the right ventricular apex, where the pacing threshold was 1.1 volt at 0.5 msec, currented threshold was 0.9 MA, and the lead impedance was 1144.  These parameters were measured immediately after lead deployment.  Subsequently this lead was secured to the prepectoral fascia and a 7 French sheath was then placed, through which was passed a Medtronic 5076 45-cm lead, serial number [id number].  It was manipulated to the right atrial appendage, where the bipolar R-wave was 1.4 millivolts with an impedance of 961 and a pacing threshold of 1.1 volts at 0.5 msec.  It should be noted that there was no diaphragmatic pacing at 10 volts with the ventricular lead.  With these acceptable parameters recorded, the leads were then attached to the [employer] IIDR ICD, model 1861, serial number [id number]. P-synchronous pacing was identified.  At this point the pacemaker was actually disconnected from the device, left in the VOO mode, and the patient was then paced while electrocautery was used to form the subcutaneous pocket.  The pocket was copiously irrigated with antibiotic-containing saline solution. The lead was then reattached to the device and the leads and the pulse generator were then placed in the pocket, secured to the prepectoral fascia. Hemostasis having been assured, defibrillation threshold testing was then undertaken.  Ventricular fibrillation was then induced via the T-wave shock. After a total duration of 7.5 seconds at a sensitivity of LEAST, ventricular fibrillation was detected and a shock with a strength  of 14 joules was delivered through a measured resistance of 42 Ohms, terminating ventricular fibrillation and restoring an AV paced rhythm.  After a wait of five to six minutes, ventricular  fibrillation was reinduced via the T-wave shock.  After a total duration of seconds at now nominal sensitivity, a 14-joule shock was delivered through a measured resistance of 40 Ohms, terminating ventricular fibrillation and restoring a ventricularly paced rhythm.  With these acceptable parameters recorded, the system was implanted.  The leads and the pulse generator were attached to the prepectoral fascia and the wound was closed in three layers in the normal fashion.  The wound was washed, dried, and a benzoin and Steri-Strip dressing was then applied.  Needle counts, sponge counts, and instrument counts were correct at the end of the procedure according to the staff. Dictated by:   Nathen May, M.D., [facility] Attending Physician:  Mirian Mo DD:  03/[birth year] TD:  04/02/02 Job: [id number]

## 2011-01-31 NOTE — Discharge Summary (Signed)
[place]  Patient:    DWIGHT, BURDO Visit Number: [id number] MRN: [id number]          Service Type: MED Location: [phone] Attending Physician:  Nathen May Dictated by:   Chinita Pester, N.P. Admit Date:  04/06/2002 Disc. Date: 04/01/02   CC:         Lewayne Bunting, M.D. [facility]  Roxy Manns, M.D. [facility]   Discharge Summary  PRIMARY DIAGNOSIS:  Complete heart block.  HISTORY OF PRESENT ILLNESS:  The patient is a 77 year old female who has a history hypertension, non-insulin dependent diabetes mellitus, hyperlipidemia, nonobstructive coronary disease with an LV dysfunction with an ejection fraction of approximately 30% to 35%, left bundle branch block pattern.  She was admitted with complaints of extreme fatigue for the past month, and two episodes of syncope. The EMS found the patient to be in complete heart block with a heart rate of 40. Her blood pressure had been stable since she arrived to the emergency room, but initially had been low. She is followed by Dr. Andee Lineman for CHF and nonobstructive coronary disease.  HOSPITAL COURSE:  During her hospitalization the patient underwent a consult with EP. During the time of her consultation with Dr. Graciela Husbands, the patient went into [place] and coded. She was cardioverted from VF to VT post sinus brady to AF to VT, to AF to sinus rhythm. Upon return of sinus rhythm the patient became bradycardic and hypotensive and was placed on a dopamine drip. That evening the patient had a temporary wire placed for pacemaking and the following day she went to the EP laboratory for the placement of a Guidant Dual Chamber pacer ICD.  The VFTs were approximately 14 joules.  Postoperative interrogation the following day showed values to be within normal limits. A chest x-ray showed no pneumothorax and the leads to be in position. The patient continued to do well and was eventually discharged to home in  stable condition on all of her previous medications.  DISCHARGE MEDICATIONS: 1. Lasix 20 b.i.d. 2. K-Dur 10 q.d. 3. Enteric coated aspirin 325 mg q.d. 4. Altace 5 b.i.d. 5. Nasonex spray once b.i.d. 6. Premarin 0.625 q.d. 7. Zyloprim 300 q.d. 8. Glynase 3 mg q.d. 9. Naprosyn 500 b.i.d.  DISCHARGE INSTRUCTIONS:  She was instructed if her defibrillator fired one time that she was OK to call the office. One time not OK, twice in a row or three times in one day to call 911. She was not to do any heavy lifting or strenuous activity with her left arm for approximately four to six weeks and not to raise that left arm above her head for one week, but gradually raise as depicted on the discharge summary. Low fat, low cholesterol, low salt diet. She was not to get her wound wet or take a shower for one week.  FOLLOWUP:  Her followup appointment was scheduled at the pacemaker clinic on April 11, 2002, at 9:15 a.m., and a follow up would be with Dr. Graciela Husbands in three months for a pacer check and the office will call with that appointment. She was also rescheduled to see Dr. Andee Lineman on April 26, 2002, at 9 a.m. Dictated by:   Chinita Pester, N.P. Attending Physician:  Nathen May DD:  04/01/02 TD:  04/06/02 Job: [id number]

## 2011-01-31 NOTE — Assessment & Plan Note (Signed)
[facility]                         GASTROENTEROLOGY OFFICE NOTE   [name], [name]                           MRN:          [id number]  DATE:10/26/2006                            DOB:          April 20, 1924    CHIEF COMPLAINT:  Followup of diarrhea and colonoscopy.   Ms. [name] had 10 colon polyps removed on October 12, 2006.  These were all  benign, either hyperplastic or adenomatous.  She had internal  hemorrhoids, diverticulosis, and otherwise normal colon with normal  biopsies.  I put her on Levbid to help her diarrhea and that made her  hallucinate, so she stopped it.  She has had a couple of spells of  diarrhea which has been urgent in the last couple of weeks that  responded to over-the-counter Imodium.  She is here with her daughter.  Her medications are listed and reviewed in the chart and are otherwise  unchanged.  She has some occasional crampy abdominal pain.  Overall, the  trend has been towards improvement in her symptoms.   PHYSICAL EXAMINATION:  Elderly white woman in no acute distress, alert  and oriented x3.  Weight 157 pounds, pulse 80, blood pressure 140/68.  The eyes are anicteric.   ASSESSMENT:  1. Colon polyps.  She had numerous colon polyps this time.  All      benign.  Previous colonoscopy in 2003 with adenomatous polyps at      that time as well.  2. Presumed irritable bowel syndrome.  This seems to be improving.  I      think this was related to her problems when she was hospitalized      with gastroenteritis and renal failure in the fall, i.e. post      infectious irritable bowel syndrome.  3. History of anemia, resolved.  4. Sensitivity to LEVBID/HYOSCYAMINE which caused hallucinations.   PLAN:  1. Continue Imodium as needed.  2. Trial of Align probiotic, a month's sample given, plus she can use      that chronically if so.  Hopefully things will continue to improve      with this post infectious irritable bowel  syndrome.  3. Return to see me as needed.  I am not inclined to perform other      workup at this time based upon the available evidence.  The patient      and her daughter understand that and know to return if things are      worse.   NOTE:  Given the number of polyps, we will consider another colonoscopy  in a year, depending upon health status.  Recall has been entered.     Iva Boop, MD,FACG  Electronically Signed   CEG/MedQ  DD: 10/26/2006  DT: 10/26/2006  Job #: [id number]   cc:   Marne A. Milinda Antis, MD

## 2011-01-31 NOTE — Assessment & Plan Note (Signed)
[facility]                                   ON-CALL NOTE   [name], [name]                             MRN:          [id number]  DATE:05/30/2006                            DOB:          Mar 20, 1924    The patient's daughter, [name], called at [phone] at 8:05 a.m. on May 30, 2006.  She called complaining that her mom had nausea and diarrhea, and  the did talk to Dr. Milinda Antis yesterday.  She was told what to do and that if  she was getting worse, to call today.  The patient has had vomiting and  diarrhea since yesterday morning.  I told her I could see her in the clinic  this morning, or if they felt she needed IV fluids, just to take her to the  emergency room to be evaluated.  [name] stated that she would talk to the  brothers and decide, and call us back if they wanted to take her to the  clinic this morning.                                   Lelon Perla, DO   YRL/MedQ  DD:  05/30/2006  DT:  05/31/2006  Job #:  [id number]   cc:   Marne A. Milinda Antis, MD

## 2011-01-31 NOTE — Op Note (Signed)
NAMEDESHAE, [name]                    ACCOUNT NO.:  [id number]   MEDICAL RECORD NO.:  [id number]          PATIENT TYPE:  INP   LOCATION:  4706                         FACILITY:  [facility]   PHYSICIAN:  Di Kindle. Edilia Bo, M.D.DATE OF BIRTH:  [birth year]   DATE OF PROCEDURE:  06/07/2006  DATE OF DISCHARGE:                                 OPERATIVE REPORT   PREOPERATIVE DIAGNOSIS:  Chronic renal failure.   POSTOPERATIVE DIAGNOSIS:  Chronic renal failure.   PROCEDURE:  Ultrasound-guided placement of a right IJ Diatek catheter.   SURGEON:  Di Kindle. Edilia Bo, M.D.   ASSISTANT:  Nurse.   ANESTHESIA:  Local with sedation.   TECHNIQUE:  The patient was taken to the operating and sedated by  anesthesia.  The ultrasound scanner was used to mark the right internal  jugular vein which appeared to be patent.  The patient had a pacemaker on  the left side.  The neck and upper chest were then prepped and draped in the  usual sterile fashion.  With the patient in Trendelenburg.  After the skin  was anesthetized, the right IJ was cannulated and a guidewire introduced  into the superior vena cava under fluoroscopic control.  The tract over the  wire was dilated and then a dilator and peel-away sheath were passed over  the wire and the wire and dilator removed.  The catheter was passed through  the peel-away sheath, and positioned in the right atrium.  The exit site for  the catheter was selected; and the skin anesthetized between the 2 areas and  the catheter was then brought through the tunnel, cut to the appropriate  length; and the distal ports were attached.  Both ports withdrew easily.  We  then flushed with heparinized saline, and filled with concentrated heparin.  The catheter was secured at its exit site with a 3-0 nylon suture.  The IJ  cannulation site was closed with 4-0 subcuticular stitch.  Sterile dressing  was applied.   The patient tolerated the procedure well was transferred  to recovery room in  satisfactory condition.  All needle and sponge counts were correct.      Di Kindle. Edilia Bo, M.D.  Electronically Signed     CSD/MEDQ  D:  06/07/2006  T:  06/09/2006  Job:  [id number]

## 2011-01-31 NOTE — Discharge Summary (Signed)
[name], [name]                              ACCOUNT NO.:  [id number]   MEDICAL RECORD NO.:  [id number]                   PATIENT TYPE:  OIB   LOCATION:  3714                                 FACILITY:  [facility]   PHYSICIAN:  Duke Salvia, M.D.               DATE OF BIRTH:  04-18-1924   DATE OF ADMISSION:  09/27/2003  DATE OF DISCHARGE:  09/28/2003                                 DISCHARGE SUMMARY   HISTORY OF PRESENT ILLNESS:  This is a 77 year old female with a history of  non ischemic heart disease, polymorphic ventricular fibrillation status post  ICD with chronic class 3 symptoms who was seen in anticipation of a BIVAD  upgrade.  She did have a problem with left upper extremity swelling post  implant and will need to undergo a venogram to make sure the vein is patent.   HOSPITAL COURSE:  The patient was admitted and underwent placement of a  BIVAD guided type ICD.  She tolerated the procedure well, had no immediate  postoperative complications although on the day of discharge the patient's  creatinine had increased from 0.9 to 1.5.  She was discharged to home on all  of her previous medications.   DISCHARGE MEDICATIONS:  1. Allopurinol 300 daily.  2. Altace 10 daily.  3. Coreg 12.5 b.i.d.  4. Digoxin 0.125 daily.  5. Flomax 0.4 daily.  6. Lasix 40 daily.  7. Glipizide 5 daily.  8. Kay Ciel 10 daily.  9. Nasonex one spray to each nostril daily.  10.      Vitamins as before.  11.      Tylenol 1-2 tablets q.4-6h p.r.n.   Activity and wound care were per pacemaker discharge sheet.  The patient  does not drive.  Low fat, low salt, low cholesterol diet.  She is to follow with Dr. Andee Lineman  October 31, 2003 at 2 p.m. and at the pacemaker clinic at [facility] in  [facility] October 12, 2003 at 9:45, and Dr. Graciela Husbands November 20, 2003 at 10:45.  She is to have a BMET drawn tomorrow at the [facility] [employer] office, with the  results call to myself, Chinita Pester, to evaluate the creatinine.      Chinita Pester, C.R.N.P. [facility]                 Duke Salvia, M.D.    DS/MEDQ  D:  09/28/2003  T:  09/29/2003  Job:  [id number]   cc:   Marne A. Milinda Antis, M.D. [facility], [facility], [facility]   Learta Codding, M.D.

## 2011-01-31 NOTE — Assessment & Plan Note (Signed)
[facility]                            CARDIOLOGY OFFICE NOTE   [name], [name]                           MRN:          [id number]  DATE:08/13/2006                            DOB:          [birth year]    [name] is an 77 year old woman with history of non-ischemic  cardiomyopathy.  Previously followed by Lewayne Bunting.  She was last seen  in cardiology clinic actually on October 12 of this year by Tereso Newcomer.  Note, she had had a hospitalization for acute renal failure,  resolve, occurred in the setting of hypotension, febrile illness.  ACE  inhibitor and diuretic were held.   Since seen, she has been doing better.  Blood pressure actually has been  a bit labile, but there have been numbers running in the higher side,  160 range.  Note, she was seen by Dr. Milinda Antis and recently her Norvasc was  discontinued and Altace was restarted.  Note, Lasix and allopurinol on  hold.   CURRENT MEDICATIONS:  1. Neurontin 600 b.i.d.  2. Nexium 40 daily.  3. Digoxin 0.062 mg daily.  4. Nasonex.  5. Humalog as directed.  6. Nu-Iron.  7. Coreg 18.75 b.i.d.  8. Altace 5 daily.   PHYSICAL EXAMINATION:  The patient is in no distress.  Blood pressure is  148/82, pulse is 70, weight 160 up from 143 per the patient's report,  last visit 158 in May.  LUNGS:  Clear.  CARDIAC:  Regular rate and rhythm, S1, S2, no S3, no significant  murmurs.  ABDOMEN:  Benign.  EXTREMITIES:  No edema.   IMPRESSION:  1. Non-ischemic cardiomyopathy, EF about 45-55% in September 2006.      Volume status not back.  I will check a BMET today.  2. Hypertension.  Looks like it is running a little on the high side      at time.  I told her to resume her Norvasc at 5 mg daily.  We will      follow up.  Continue other medicines.  3. History of renal failure secondary to hypotension/infection.      Again, check creatinine today.  4. History of complete heart block, has defibrillator with  pacer, now      upgrade to Bi-V 2005.  History of atrial lead malfunction secondary      to lead failure.  5. History of polymorphic VT ICD in place.  6. History of renal cell CA status post partial right nephrectomy.  7. Diabetes type II.  8. Anemia.  9. Gout, off allopurinol.  10.Dyslipidemia.  Will need to follow up, currently not on any agent.      Will discuss on return.   I will set followup for a couple months, sooner if problems develop.     Pricilla Riffle, MD, [facility]  Electronically Signed    PVR/MedQ  DD: 08/13/2006  DT: 08/14/2006  Job #: [id number]   cc:   Marne A. Milinda Antis, MD

## 2011-01-31 NOTE — Discharge Summary (Signed)
[name], [name]                    ACCOUNT NO.:  [id number]   MEDICAL RECORD NO.:  [id number]          PATIENT TYPE:  INP   LOCATION:  1406                         FACILITY:  [facility]   PHYSICIAN:  Rene Paci, M.D. LHCDATE OF BIRTH:  02-09-1924   DATE OF ADMISSION:  03/04/2005  DATE OF DISCHARGE:  03/10/2005                                 DISCHARGE SUMMARY   DISCHARGE DIAGNOSES:  1.  Dehydration with diarrhea acute on chronic, resolved.  2.  Acute renal insufficiency secondary to number #1, resolved; discharge      creatinine 1.0.  3.  Abnormal liver function tests with dilated common bile duct status post      endoscopic retrograde cholangiopancreatography March 07, 2005, with no      evidence of stone or obstruction.  Outpatient follow up GI p.r.n., Dr.      Leone Payor.  4.  Complex left renal cyst, stable by CT.  Outpatient follow-up with Dr.      Isabel Caprice p.r.n.  5.  History of ischemic cardiomyopathy, status post ICD and pacer.  6.  Type 2 diabetes.  7.  History of hypertension.  8.  Mild normocytic anemia.  9.  Mild to moderate dementia with sundowning in hospital.   DISCHARGE MEDICATIONS:  Are as follows  1.  Glipizide XL 10 mg p.o. q.a.m.  2.  Allopurinol 100 mg p.o. every day..  3.  Altace 10 mg p.o. every day..  4.  Neurontin 600 mg p.o. b.i.d.  5.  Nexium 40 mg p.o. every day.  6.  Lasix 20 mg p.o. every day  7.  Darvocet p.r.n.  8.  Cosamin every day.  9.  Meclizine p.r.n.  10. Imodium p.r.n.  11. Multivitamin every day   The patient is to discontinue her Diclofenac and Metformin from prior to  admission and to be resumed per for follow-up with primary.   FOLLOW UP:  Hospital follow-up will be arranged with primary M.D., [facility] by family in the next 2-3 weeks as needed.  Likewise, follow-up with  Dr. Leone Payor per direction of primary M.D. as needed.   CONDITION ON DISCHARGE:  Medically stable, tolerating regular diet without  pain, nausea, vomiting or  diarrhea.  Hemodynamically stable.   HOSPITAL COURSE:  PROBLEM #1 - DEHYDRATION WITH DIARRHEA:  The patient is a  pleasant 77 year old woman with mild chronic diarrhea who had exacerbation  of her symptoms increased with stools for several days prior to admission.  This in combination with her Lasix left the patient dehydrated and weak  feeling, more difficult to arouse which is why she was brought to the  emergency room.  She was treated with gentle IV fluids holding Lasix and  improved remarkably.  Her home dose of Lasix was decreased to only 20 mg  daily, and at this time has not shown any evidence of volume reaccumulation.  Management of volume will be per primary M.D. and cardiologist, Dr. Graciela Husbands,  as needed.   PROBLEM #2 - ABNORMAL LIVER FUNCTION TESTS:  The patient is status post  cholecystectomy but  had noted LFT abnormalities on admission.  Ultrasound  was subsequently done showing a dilated common bile duct at 15 mm, which is  increased from the years previous scan.  Likewise LFTs were also elevated  whereas they had previously been normal.  As such a GI consult was obtained,  and the patient underwent an ERCP to rule out obstructing stricture. mass or  stone and fortunately none could be found.  The patient had a bump in her  LFTs but no clinical or biochemical evidence of pancreatitis.  Post ERCP,  she was monitored for two days.  Her diet was resumed to normal, and she had  no specific abdominal pain.  Outpatient follow-up on this with primary MD  and GI as needed.   PROBLEM #3 - LEFT RENAL COMPLEX CYST:  This has been present and stable for  a good while, previously followed by urologist.  A CT scan done during this  hospitalization showed no significant changes and requiring no further  intervention at this time.   PROBLEM #4 - ISCHEMIC CARDIOMYOPATHY STATUS POST IMPLANTABLE CARDIOVERTER-  DEFIBRILLATOR AND PACER:  The patient's daughter notes a crack in the pacer  wire  according to Dr. Graciela Husbands and reportedly the pacer is on recall.  Prior to  undergoing ERCP,  Dr. Graciela Husbands was contacted who felt the patient was still  stable to undergo procedure.  Further follow-up and management per Dr. Graciela Husbands  as needed and previously scheduled.   LABORATORY DATA:  At the time of discharge, total bilirubin of 2.2, alkaline  phosphatase of 544, AST of 45, ALT of 169.  CA19-9 negative at 8.6.  CEA  negative of 0.9.  Creatinine normal at 1.0       VL/MEDQ  D:  03/10/2005  T:  03/10/2005  Job:  [id number]

## 2011-01-31 NOTE — Assessment & Plan Note (Signed)
[name]                              CARDIOLOGY OFFICE NOTE   [name], [name]                           MRN:          [id number]  DATE:06/26/2006                            DOB:          12/31/1923    PRIMARY CARE PHYSICIAN:  Marne A. Milinda Antis, M.D.   PRIMARY CARDIOLOGIST:  [name] is actually new Dr. Pricilla Riffle.  She  was previously followed by Dr. Andee Lineman.   ELECTROPHYSIOLOGIST:  Dr. Duke Salvia.   HISTORY OF PRESENT ILLNESS:  [name] is a very pleasant 77 year old female  [name] with a history of nonischemic cardiomyopathy with an EF of 30-35%  improved, 40-55% by last echocardiogram September 2006 as well as a history  of polymorphic ventricular tachycardia and complete heart block status post  ICA implantation with pacemaker who was recently admitted to [facility]  with acute renal failure thought to be secondary to acute tubular necrosis.  Her ACE inhibitor and diuretics were held.  She was also dialyzed x3.  Her  creatinine improved and she was eventually discharged on June 17, 2006.  She also received IV iron secondary to iron-deficiency anemia.  Her heart  failure seemed to remain stable throughout her hospitalization.  She was due  to actually see Dr. Tenny Craw last week but was still in [place] hospital and  rescheduled for today.  She notes since being discharged from [facility]  she is doing well.  Denies any chest pain.  She has dyspnea on exertion but  there has been no significant change.  She denies any orthopnea, paroxysm  nocturnal dyspnea.  She denies any syncope or presyncope.  She saw Dr. Milinda Antis  earlier this week and noted that she was doing well and actually took some  labs.  Her creatinine was improved at 1.4; when she was admitted it was 10.   CURRENT MEDICATIONS:  1. Neurontin 600 mg b.i.d.  2. Nexium 40 mg daily.  3. Digoxin 0.625 mg daily.  4. Nasonex.  5. Humalog.  6. Nu-Iron.  7. Coreg 18.75 mg  b.i.d.  8. Norvasc 5 mg daily.  9. Altace, allopurinol and Lasix currently on hold.  10.Darvocet p.r.n.  11.Meclizine p.r.n.   ALLERGIES:  No known drug allergies.   PHYSICAL EXAMINATION:  She is a well-developed, well-nourished female.  Blood pressure 150/77, pulse 70, weight 153 pounds.  HEENT:  Unremarkable.  NECK:  Without JVD at 90 degrees.  CARDIAC:  Normal S1 and S2.  Regular rate and rhythm.  LUNGS:  Clear to auscultation bilaterally.  ABDOMEN:  Soft.  EXTREMITIES:  Without edema.  Calves are soft and nontender.  SKIN:  Warm and dry.   LABORATORY DATA:  Electrocardiogram reveals ventricular pacing and a heart  rate of 70.   IMPRESSION:  1. Status post recent admission for acute renal failure secondary to      hemodynamic acute tubular necrosis.      a.     Status post hemodialysis x3.      b.     Recent creatinine of 1.4.  2. Nonischemic cardiomyopathy with previous ejection fraction of 30-35%.      a.     Ejection fraction improved to 45-55% by echocardiogram September       2006.  3. History of polymorphic ventricular tachycardia, status post implantable      cardioverter defibrillator implantation with pacer.      a.     Upgrade to biventricular device 2005.      b.     History of atrial lead malfunction secondary to lead failure.  4. History of complete heart block.  5. History of renal carcinoma, status post partial right nephrectomy.  6. Diabetes mellitus type 2.  7. Hypertension.  8. Anemia.  9. History of dementia with sundowning.  10.Gout.  11.History of total abdominal hysterectomy in 1994.  12.History of cholecystectomy in 1995.  13.Gout.  14.Dyslipidemia.   PLAN:  [name] returns to [place] office today for follow-up  posthospitalization.  She was resubmitted with acute renal failure.  This  seems to have resolved.  This was in [place] setting of hypotension and febrile  illness.  Her ACE inhibitor and diuretic have both been held.  Her blood  pressure  is on [place] high side but I would be hesitant to increase any of her  medications at this  point and time.  She sees Dr. Milinda Antis back later this month and I will bring  her back in follow-up with Dr. Tenny Craw in [place] next six weeks.     ______________________________  Tereso Newcomer, PA-C    ______________________________  Pricilla Riffle, MD, [facility]   SW/MedQ DD:  06/26/2006 DT:  06/28/2006 Job #:  [id number]   cc:   Marne A. Milinda Antis, MD

## 2011-01-31 NOTE — Assessment & Plan Note (Signed)
HEALTHCARE                            CARDIOLOGY OFFICE NOTE   TANISHA, LUTES                           MRN:          [id number]  DATE:09/22/2006                            DOB:          10/09/1923    IDENTIFICATION:  Ms. Kupfer is a 77 year old woman with a history of non-  ischemic cardiomegaly.  I saw her for the first time back in late  November.  She was previously followed by Lewayne Bunting, saw Tereso Newcomer  once.   When I saw her last, her volume status looked not bad.  A BMET showed  her BNP was slightly elevated in the low 600's.  Her blood pressure  though was running a little high and I told her to resume her Norvasc at  5 daily.   She has done this.  Her relative has taken blood pressures though and  there are a couple of periods when it went low.  She was lightheaded at  the time, had some diarrhea the days.  There was on January 2 when blood  pressure in the afternoon was 70/43.  They did not retake it until later  in the evening when it was 150/72.  No modification in regimen was done.  She did on the 27th have a blood pressure reading of 98/47 for about an  hour and a half.  After that time, Norvasc was cut down in half.  Otherwise her blood pressure really has been okay, actually a little on  the high side, the 150's to 160's at times, 170 at most.   She says though she is feeling well.  Denies chest pain.  Her relatives  say she is not moving as fast as before she went into the hospital.   CURRENT MEDICATIONS:  1. Neurontin 600 b.i.d.  2. Nexium 40 daily.  3. Nasonex.  4. Humalog as directed.  5. Altace 5.  6. Norvasc 2.5.  7. Digitek .125.  8. Coreg 12.5 1.5 tablets b.i.d.  9. Ferrex 150 daily.   PHYSICAL EXAMINATION:  The patient is in no distress.  Blood pressure  157/78, pulse is 71, weight 158.  NECK:  JVP is normal.  LUNGS:  Clear, without rales.  CARDIAC:  Regular rate and rhythm, S1, S2, no S3, no murmurs.  ABDOMEN:  Benign.  EXTREMITIES:  No edema.   IMPRESSION:  1. Hypertension, labile.  I still think we need to get better control.      I would tell her to take the Norvasc 2.5 additional at bedtime.      Continue to check blood pressures, bring cuff in on next visit.  If      she has a low reading, check it again in about 20 minutes, make      sure it is not erroneous.  The 70 systolic is not convinced was      real.  2. Cardiomyopathy.  Again, volume status not too bad.  Continue on      regimen.  3. History of renal failure secondary to hypotension.  Creatinine of  1      on last visit.  4. Diabetes on agents.  5. History of complete heart block, has defibrillator in place, Bi-V      upgrade in 2005.  6. Anemia.   I will set followup for 3 months.  She will come in for her device check  on the 28th and I told her to bring a copy of her blood pressures to  [facility] with review and we will contact her.     Pricilla Riffle, MD, [facility]  Electronically Signed    PVR/MedQ  DD: 09/22/2006  DT: 09/22/2006  Job #: [id number]

## 2011-01-31 NOTE — Consult Note (Signed)
[name], [name]                              ACCOUNT NO.:  [id number]   MEDICAL RECORD NO.:  [id number]                   PATIENT TYPE:  INP   LOCATION:  2031                                 FACILITY:  [facility]   PHYSICIAN:  Duke Salvia, M.D.               DATE OF BIRTH:  Nov 28, 1923   DATE OF CONSULTATION:  07/25/2003  DATE OF DISCHARGE:                                   CONSULTATION   REASON FOR CONSULTATION:  Thank you very much for asking me to see [name] in electrophysiology consultation for ventricular fibrillation.   BRIEF HISTORY:  [name] is a 77 year old lady who has a history of complete  heart block in July 2003 at which time she developed torsade de pointes.  Because of her significant nonischemic cardiomyopathy, we elected to put in  a defibrillator.  She had done fine until the day of admission when she  suddenly became unresponsive.  Interrogation of her defibrillator  subsequently demonstrated a non-paused and polymorphic VT/VF episode that  was appropriately sensed and terminated by her defibrillator.  The shocking  impedance of that event was 45 ohms.   At baseline, the patient has class III congestive symptoms.  She is short of  breath at 100 feet.  She has chronic orthopnea and peripheral edema.  She  has occasional nocturnal dyspnea.  Her LV function has been estimated at 30  to 35%.   PAST MEDICAL HISTORY:  1. Neuropathy.  2. Diabetes.  3. Hypertension.  4. Hyperlipidemia.  5. Thyroid disease.  6. Renal cell cancer.  7. DVT related to her implant.  8. History of gout.   MEDICATIONS ON ADMISSION:  1. Lasix.  2. Neurontin 600 b.i.d.  3. Allopurinol.  4. Altace 10 mg b.i.d.  5. Lanoxin 0.125 mg.  6. Coumadin.  7. Coreg 12.5.  8. Glucotrol 5 mg.  9. Flomax.   SOCIAL HISTORY:  She lives in [place].  She is widowed.  Her family is  very involved in her care.   REVIEW OF SYSTEMS:  As noted on Dr. Myrtis Ser' intake sheet and not further  reviewed here.   PHYSICAL EXAMINATION:  VITAL SIGNS:  This afternoon, her blood pressure is  152/66, pulse 84.  GENERAL:  No acute distress.  HEENT:  No icterus, no xanthomata.  NECK:  Veins were flat.  The carotids were brisk and full bilaterally  without bruits. Neck veins were approximately 7 cm.  BACK:  Without kyphosis, scoliosis.  LUNGS:  Clear.  HEART:  Sounds were somewhat distant.  Rhythm was regular.  I did not hear  murmur or gallop.  ABDOMEN:  Soft with active bowel sounds without midline pulsation or  hepatomegaly.  EXTREMITIES:  Femoral pulses were 2+.  Distal pulses were intact.  There was  no clubbing, cyanosis, or edema.  NEUROLOGIC:  Grossly normal.   LABORATORY  DATA:  Electrocardiogram demonstrates that she is AV paced and  presynchronically paced with a broad QRS.   Laboratories were unrevealing.   Interrogation of her device demonstrated the event as noted above with  polymorphic VT/VF appropriately detected and terminated by defibrillator.   Echocardiogram demonstrated ejection fraction of 25 to 30%.   IMPRESSION:  1. Ventricular tachycardia, polymorphic.  2. History of torsades de pointes.  3. Complete heart block status post arteriovenous implantable cardioverter     defibrillator for #2 and #3.  4. Nonischemic cardiomyopathy with class III congestive failure.  5. Diabetes.  6. Hypertension.  7. History of renal cell cancer.  8. Left upper extremity deep vein thrombosis related to her implantable     cardioverter defibrillator on Coumadin.   DISCUSSION:  [name] had polymorphic VT that is NOT consistent with  torsades de pointes.  The defibrillator functioned appropriately.  No  further programming changes are needed.   I do think, however, that upgrade of her device to a biventricular device  for cardiac resynchronization would be appropriate as she has class III  symptoms despite medical therapy that is optimized and maximized give the  fact  that she has relative hypotension.  I would consider decreasing the  Altace and increasing her Coreg as blood pressure allows.                                               Duke Salvia, M.D.    SCK/MEDQ  D:  07/25/2003  T:  07/25/2003  Job:  [id number]   cc:   Marne A. Tower, M.D. [facility]   Electrophysiology Lab   [facility]

## 2011-01-31 NOTE — Assessment & Plan Note (Signed)
[facility]                            CARDIOLOGY OFFICE NOTE   GENIEVE, RAMASWAMY                           MRN:          [id number]  DATE:12/31/2006                            DOB:          07/05/1924    IDENTIFICATION:  Ms. Kitner is an 77 year old woman with history of non-  ischemic cardiomyopathy and labile blood pressure.   I last saw the patient back in January. When I saw her last, I told her  to increase her Norvasc to 2.5 b.i.d. for more even blood pressure.   The daughter brings in her blood pressure reports and they have actually  still been a little on the high side in the 130s-160s and 110 systolic  was the lowest.   Otherwise, she says she is feeling well. She just got over cataract  surgery and is seeing very well.   She denies shortness of breath. No chest pain, PND.   CURRENT MEDICATIONS:  1. Coreg 12.5 one and a half tablets b.i.d.  2. Norvasc 2.5 b.i.d.  3. Digoxin 0.125 one-half of this so 0.0625 daily.  4. Altace 5 daily.  5. Ferrex 150 daily.  6. Gabapentin 600 b.i.d.  7. Nexium 40 daily.  8. Nasonex spray.  9. Insulin p.r.n.  10.Vitamin B b.i.d.  11.Align 4 mg daily.  12.Omni pred b.i.d. eye drops.   PHYSICAL EXAMINATION:  The patient is in no distress. Blood pressure is  137/65, pulse 79. Weight is 157, which is stable from last visit.  NECK: JVP is normal.  LUNGS:  Clear. No rales.  CARDIAC: Regular rate and rhythm, S1, S2. No S3 and no murmurs.  ABDOMEN: Benign.  EXTREMITIES: No edema.   IMPRESSION:  1. Non-ischemic cardiomyopathy. The patient clinically volume status      looks good. She has a Bi-V in place, followed in Device Clinic. I      would increase her Coreg to 25 b.i.d. and followup later this      summer.  2. Hypertension. Again, increase Coreg slightly as she is quite      sensitive.   I will set to see the patient in September 2008, sooner if problems  develop.     Pricilla Riffle, MD, [facility]  Electronically Signed    PVR/MedQ  DD: 12/31/2006  DT: 12/31/2006  Job #: [id number]   cc:   Marne A. Milinda Antis, MD

## 2011-01-31 NOTE — Cardiovascular Report (Signed)
[place]  Patient:    [name], [name]                           MRN: [id number] Proc. Date: 10/29/99 Adm. Date:  [id number] Attending:  Learta Codding CC:         Evelene Croon, M.D.             Cardiac Catheterization Lab                        Cardiac Catheterization  PROCEDURES PERFORMED: 1. Left heart catheterization with selective coronary angiography. 2. Ventriculography.  DIAGNOSES: 1. Non-flow limiting coronary artery disease. 2. Nonischemic cardiomyopathy.  HISTORY:  Mrs. [name] is a 77 year old white female with a history of hypertension and non-insulin-dependent diabetes mellitus as well as hypertriglyceridemia.  The patient has been referred by Dr. Lacie Scotts for evaluation of possible ischemic heart disease.  The patient was found to have severe LV systolic dysfunction by echocardiography.  She also has moderate to severe pulmonary hypertension.  There was also evidence of inferior wall akinesis. She was seen by Dr. Andee Lineman in consultation in the office.  Initially, a dobutamine echocardiogram was performed to assess her ischemia as well as viability.  This  showed that the patient has essentially no viable myocardium in the anterior wall as well as possible ischemia in the inferior wall.  She has now been referred for a diagnostic catheterization to assess her coronary anatomy.  DESCRIPTION OF PROCEDURE:  After informed consent was obtained, the patient was  brought to the catheterization laboratory.  The right groin was sterilely prepped and draped and 1% lidocaine was used to infiltrate her right groin.  A 6-French  arterial sheath was placed using the modified Seldinger technique.  The 6-French JL4 and JR4 catheters were used to engage the left and right coronary arteries.  Selective angiography was performed in various projections using manual injections of contrast.  After selective coronary angiography, a 6-French  pigtail catheter was advanced into the left ventricle.  Appropriate left-sided hemodynamics were obtained at that time.  The left ventriculogram was then performed using power injections of contrast.  The pigtail was then pulled back into the aorta to evaluate for aortic valve gradient.  At the termination of the case, the catheters and sheath were removed and manual pressure was applied until adequate hemostasis was achieved.  The patient tolerated the procedure well and was transferred to he floor in stable condition.  FINDINGS:  Left heart selective coronary angiography: 1. The left main coronary artery was normal with no flow-limiting disease. 2. The left anterior descending artery had 30% stenosis in the mid vessel at the    takeoff of a second diagonal branch.  The second diagonal branch also had some    mild plaquing in the proximal part of the vessel.  There are three large    diagonals taking off from the left anterior descending artery. 3. The left circumflex coronary artery is within normal limits with no    flow-limiting disease. 4. The right coronary artery is a large vessel giving rise to a large posterior    descending artery and a large posterolateral branch.  There appears to be 50%    ostial stenosis of the right coronary artery although there appears to be good    efflux of dye, and there was no dampening of the catheter when engaging  in the    right coronary ostium.  There is also a 40% mid vessel lesion which is rather    eccentric.  There is an additional 20% distal right coronary artery lesion. The    posterolateral branch, as well as the posterior descending arteries, is free of    disease.  Ventriculography was obtained, single plane, RAO projection.  There appears to e global hypokinesis with the ejection fraction estimated to be 30-35%.  CONCLUSION: 1. Nonischemic cardiomyopathy. 2. Coronary artery disease but not flow-limiting. 3. Diastolic  dysfunction.  RECOMMENDATIONS:  The results have been reviewed with Dr. Lacie Scotts as well as with the patient and her family.  It appears that her ejection fraction is out of proportion, depressed, compared to the amount of coronary disease that she has.  The study will be reviewed, though, with Dr. Riley Kill to evaluate a proximal right coronary lesion.  In addition, the patient will be scheduled for an adenosine Cardiolite study to be done tomorrow to rule out inferior ischemia.  It appears  that the patient has a nonischemic cardiomyopathy, possibly due to long-standing hypertension.  Her chest pain may well be due to supply and demand mismatch with an elevated left ventricular end diastolic pressure.  The patient should benefit from ACE inhibitor and beta blocker therapy.  In addition, nitrates could be added for symptomatic relief.  The patient will follow up with Dr. Andee Lineman in the office as well as Dr. Lacie Scotts.  The patient will be admitted and has been scheduled for  Cardiolite study tomorrow. DD:  10/29/99 TD:  10/29/99 Job: [id number]

## 2011-01-31 NOTE — Op Note (Signed)
[facility]  Patient:    [name], [name] Visit Number: [id number] MRN: [id number]          Service Type: SUR Location: 1S X005 01 Attending Physician:  Thermon Leyland Dictated by:   Barron Alvine, M.D. Proc. Date: 06/07/01 Admit Date:  06/07/2001   CC:         Lewayne Bunting, M.D.  Roxy Manns, M.D. [facility]   Operative Report  PREOPERATIVE DIAGNOSIS:  Right renal mass.  POSTOPERATIVE DIAGNOSIS:  Right renal mass.  OPERATION PERFORMED:  Right flank exploration with right partial nephrectomy.  SURGEON:  Barron Alvine, M.D.  ASSISTANT:  Sigmund I. Patsi Sears, M.D.  ANESTHESIA:  General endotracheal.  INDICATIONS FOR PROCEDURE:  The patient is a 77 year old female who has had some nonspecific abdominal pain.  ____________ evaluation.  She had both a non-contrast and subsequently contrast CT scan of the abdomen.  This revealed an exophytic but enhancing mass off the lower pole of the right kidney.  The radiographic findings were most consistent with renal cell carcinoma.  The patient has had preoperative clearance and we have evaluated her situation with the cardiologist.  She is felt to be a reasonable candidate and not particularly high risk for surgery.  We felt that given the nature of this lesion, exploration with probable partial nephrectomy was indicated.  Full informed consent was obtained.  DESCRIPTION OF PROCEDURE:  The patient was brought to the operating room where she had successful induction of general anesthesia.  A Foley catheter was also placed.  She was then placed in a flank position.  Standard precautions were taken with an axillary roll and padding of all extremities.  The kidney rest was used.  A standard flank incision off the tip of the 12th rib was performed and a small amount of the 12th rib tip was taken. On flank exploration one could palpate a solid mass emanating off the lower pole.  We freed the inferior pole of the  kidney taking great care to identify and preserve the ureter.  The ureter actually came up posteriorly right along this mass.  By separating some of the perinephric fat we were able to separate the ureter away from this solid lesion.  The tumor itself was approximately 3 cm in size. It was quite exophytic right off the posterior and inferior pole of the kidney.  A standard partial nephrectomy was performed.  The renal parenchyma surrounding this lesion was then incised several millimeters away from the tumor itself.  Back of the knife handle was then used to cut through some normal parenchyma and establish a margin.  In this manner, the tumor was removed.  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  The cut renal parenchyma was subjected to argon beam photocoagulation.  We then placed a plug of fibrin glue with Gelfoam.  The perinephric fat was then reapproximated over this.  No drain was utilized.  There was no evidence of collecting system injury or entry.  Blood loss was really quite minimally, probably 100 cc or less.  The patient appeared to tolerate the procedure well.  The wound was then closed in a standard manner with three layers anteriorly and two posteriorly.  Clips were used for the skin.  We did infiltrate the wound with some Marcaine.  The patient tolerated the procedure very nicely. Dictated by:   Barron Alvine, M.D. Attending Physician:  Thermon Leyland DD:  06/07/01  TD:  06/07/01 Job: [id number]

## 2011-01-31 NOTE — Discharge Summary (Signed)
[name], [name]                              ACCOUNT NO.:  [id number]   MEDICAL RECORD NO.:  [id number]                   PATIENT TYPE:  INP   LOCATION:  2031                                 FACILITY:  [facility]   PHYSICIAN:  Rene Paci, M.D. [facility]          DATE OF BIRTH:  1924/05/07   DATE OF ADMISSION:  07/22/2003  DATE OF DISCHARGE:  07/26/2003                                 DISCHARGE SUMMARY   DISCHARGE DIAGNOSES:  1. Syncope.  2. Ventricular fibrillation with implantable cardioverter-defibrillator     firing.   BRIEF HISTORY:  Ms. Bruster is a 77 year old white female who presented with an  episode of unresponsiveness and a question of seizures.  This was associated  with memory loss related to the event.   PAST MEDICAL HISTORY:  1. Adult-onset diabetes mellitus.  2. History of complete heart block status post pacemaker and ICD.  3. Nonobstructive coronary artery disease.  4. LV dysfunction with an EF of 30 to 35%.  5. History of left upper extremity DVT in July 2003.  6. Hypertension.  7. Normocytic anemia.   HOSPITAL COURSE:  #1.  SYNCOPE:  The patient was admitted for further evaluation.  The patient  did not have any further episodes.  She had no evidence of seizure activity,  no evidence of arrhythmia.  Her head CT was unremarkable.  Carotid Dopplers  were negative for ICA stenosis.  A 2-D echocardiogram revealed significant  dyssynergy of symptoms, hypokinesis of the inferior and lateral walls with  EF of 25 to 35% but nothing to account for syncope.  The patient was not a  candidate for MRI for the brain to rule out VVI because of her pacemaker.  The patient did have some orthostasis.  We did have her ICD checked, and she  did have some discharges for ventricular fibrillation.  Therefore,  cardiology was asked to see the patient.  They made recommendation that she  would likely need change to a biventricular device, but this could be  postponed until early December.   They did make recommendation to increase  her Coreg for now.   #2.  RIGHT MAXILLARY SINUSITIS BY CT:   LABORATORY DATA:  On discharge, hemoglobin 11.  Pro time 25, INR 3.1.  LFTs  were normal.  Hemoglobin A1C was elevated at 82%.   DISCHARGE MEDICATIONS:  1. Coreg 18.75 one tablet b.i.d.  2. Coumadin 4 mg daily.  3. Lasix 40 mg daily.  4. Digoxin 0.125 mg daily.  5. Altace 2 mg daily.  6. K-Dur 10 mEq daily.  7. Allopurinol 300 mg daily.  8. Neurontin 300 mg b.i.d.  9. Flomax 0.4 mg daily.   FOLLOW UP:  Coumadin Clinic on August 04, 2003, at 2 p.m., and Dr. Graciela Husbands  in early December.      Cornell Barman, P.A. [facility]  Rene Paci, M.D. [facility]    LC/MEDQ  D:  08/16/2003  T:  08/16/2003  Job:  [id number]   cc:   Duke Salvia, M.D.   Marne A. Milinda Antis, M.D. [facility]

## 2011-01-31 NOTE — Op Note (Signed)
[name], [name]                              ACCOUNT NO.:  [id number]   MEDICAL RECORD NO.:  [id number]                   PATIENT TYPE:  OIB   LOCATION:  3714                                 FACILITY:  [facility]   PHYSICIAN:  Duke Salvia, M.D.               DATE OF BIRTH:  1924/04/18   DATE OF PROCEDURE:  09/27/2003  DATE OF DISCHARGE:  09/28/2003                                 OPERATIVE REPORT   PREOPERATIVE DIAGNOSIS:  Congestive failure, previously implanted ICD with a  history of recurrent polymorphic ventricular tachycardia.   POSTOPERATIVE DIAGNOSIS:  Congestive failure, previously implanted ICD with  a history of recurrent polymorphic ventricular tachycardia.   PROCEDURE:  Upgrade of her device to a biventricular system.   Following obtaining informed consent, the patient was brought to the  electrophysiology laboratory and placed on the fluoroscopy table in supine  position.   This note has been dictated and the report number is [phone].                                               Duke Salvia, M.D.    SCK/MEDQ  D:  10/26/2003  T:  10/26/2003  Job:  [id number]

## 2011-01-31 NOTE — Discharge Summary (Signed)
[name], [name]                              ACCOUNT NO.:  [id number]   MEDICAL RECORD NO.:  [id number]                   PATIENT TYPE:  INP   LOCATION:  4731                                 FACILITY:  [facility]   PHYSICIAN:  [name]                         DATE OF BIRTH:  Jun 10, 1924   DATE OF ADMISSION:  04/06/2002  DATE OF DISCHARGE:  04/13/2002                                 DISCHARGE SUMMARY   PRIMARY DISCHARGE DIAGNOSIS:  Left upper extremity deep vein thrombosis.   HISTORY OF PRESENT ILLNESS:  This is a 77 year old female who was seen in  the office complaining of left arm swelling.  She is status post permanent  pacemaker ICD for complete heart block _________.  The left upper extremity  showed a DVT by ultrasound, and the patient was admitted for heparin and  Coumadin therapy.   HOSPITAL COURSE:  During hospitalization the patient was restarted on Coreg.  She has been tolerating this well.  Her left upper arm had decreased  significantly in edema, and she was discharged when her INR was therapeutic  for 24 hours.  The patient's INR on the day of discharge was 2.2.  She was  then discharged to home in stable condition on the following medications.   DISCHARGE MEDICATIONS:  1. Lasix 40 daily.  2. Coated aspirin 81 daily.  3. K-Dur 10 daily.  4. Multivitamin daily.  5. Os-Cal 500 b.i.d.  6. Altace 5 b.i.d.  7. Zyloprim 300 daily.  8. Glynase 300 daily.  9. Premarin 0.625 daily.  10.      Nasonex.  11.      Coreg 3.125 b.i.d.  12.      Digoxin 0.125 daily.  13.      Coumadin 3 mg nightly.   DIET:  Low-fat, low-cholesterol, low-salt diet.   DISCHARGE INSTRUCTIONS:  She was to keep her left arm elevated as much as  possible.  She was now allowed to move that arm.  Her Steri-Strips were  removed, and the incision was well approximated with no hematoma and no  drainage.  She was scheduled to have a CBC drawn on April 17, 2002, at the  [place] office.    FOLLOW-UP:  An appointment was scheduled for Dr. Andee [name] on April 26, 2002,  at 9 [place].  Dr. Graciela [name] July 06, 2002 at 10 [place].  She was to be seen in the  Coumadin clinic at [facility] on Friday, April 15, 2002, at 3 p.m.                                               [name]    DS/MEDQ  D:  04/13/2002  T:  04/19/2002  Job:  [id number]   cc:   Pacemaker [facility] at [facility] [name], M.D. [facility]

## 2011-01-31 NOTE — Consult Note (Signed)
[name]                              ACCOUNT NO.:  [id number]   MEDICAL RECORD NO.:  [id number]                   PATIENT TYPE:  INP   LOCATION:  2031                                 FACILITY:  [facility]   PHYSICIAN:  Willa Rough, M.D.                  DATE OF BIRTH:  10-Jan-1924   DATE OF CONSULTATION:  DATE OF DISCHARGE:                                   CONSULTATION   [name] is known to Dr. Andee Lineman and Dr. Graciela Husbands of our practice.  The patient  was admitted with syncope, the exact etiology is not clear.  Witnessed by  her family, she had an acute, jerking motion of her left arm followed by  loss of consciousness.  There was no incontinence. She remained conscious  and was transported to the hospital.  It is my understanding that there was  no documentation of tachy or bradyarrhythmias.  The patient does have an ICD  in place.  It is of note that the patient had had some headache before she  had her event.   There is a history of nonobstructive coronary disease.  There is also a  history of an ejection fraction in the range of 30-35%.  The patient has had  some CHF in the past on the basis of diastolic dysfunction.  There is also a  history of ventricular tachycardia/torsade de point.  The patient has had  chronic dizziness. This seems somewhat improved with meclizine. She has had  no major recent falls. Also, she had her ICD/pacemaker checked by Dr. Graciela Husbands  in the office on July 09, 2003, and we are looking for those records.  There is no significant chest pain.   PAST MEDICAL HISTORY:  For other medical problems, see the complete list  below.   ALLERGIES:  NO KNOWN DRUG ALLERGIES.   MEDICATIONS:  1. Nasonex.  2. Neurontin 300 mg b.i.d.  3. Allopurinol 300 mg daily.  4. Potassium 10 mEq.  5. Coreg 12.5 b.i.d.  6. Altace 10 b.i.d.  7. Digoxin 0.125 daily.  8. Lasix 40 mg  daily.  9. Coumadin 4 mg daily.   SOCIAL HISTORY:  Patient lives in [place].  She is  widowed, she has  family that works with her very carefully.   FAMILY HISTORY:  Noncontributory.   REVIEW OF SYSTEMS:  She has had no significant fevers or chills.  She does  have some headaches.  SKIN:  She has some occasional bruising.  CARDIOPULMONARY:  There has been some shortness of breath and no significant  chest pain.  GENITOURINARY:  No problems.  MUSCULOSKELETAL:  She does have  some arthralgias.  GASTROINTESTINAL:  She has had some diarrhea over time.  Remainder of her review of systems is negative.   PHYSICAL EXAMINATION:  VITAL SIGNS:  Temperature 97.2 with a pulse of 76,  respirations 20, blood  pressure 100/50.  She is stable at this time.  HEENT:  No marked abnormalities.  NECK:  She has no obvious bruits.  LUNGS:  There is a question of a few crackles at the bases of her lungs.  CARDIOVASCULAR:  S1/S2 but no clicks or significant murmurs.  EXTREMITIES:  Benign. She has no definite peripheral edema.  There are a few  areas of ecchymoses on her skin.  ABDOMEN:  Benign.  GENITOURINARY/RECTAL:  Not done.  NEUROLOGIC:  Grossly intact at this time.   LABORATORY DATA:  EKG actually is not available on the chart at this time,  is being ordered and will be reviewed.  Chest x-ray revealed some  atelectasis, there is some cardiomegaly.  Hemoglobin 11, platelet count  237,000, BUN 32, creatinine 0.9, glucose elevated at 250, INR is 3.0 and  patient is on Coumadin. Digoxin is less than 0.2.  CT scan of the head  showed no obvious bleed. There was a right sinusitis and no acute change.   Her telemetry since she has been in-hospital has not shown any marked  abnormalities.   PROBLEMS:  1. History of mild coronary disease by cath in the past.  2. History of ejection fraction in the range of 30-35%.  3. History of congestive heart failure with diastolic dysfunction.  4. History of neuropathy.  5. History of syncope in the past with complete heart block and torsades de      pointes, for which she has an ICD, placed in 2003.  6. History of left bundle branch block.  7. History of diabetes.  8. History of hypertension.  9. History of increased lipids.  10.      History of thyroid disease.  11.      History of renal cell carcinoma.  The patient had a nephrectomy     (question partial nephrectomy).  12.      History of deep venous thrombosis in the past, on Coumadin.  13.      History of gout.  14.      History of total abdominal hysterectomy.  15.      Coumadin therapy.  16.      History of dizziness.   Current episode that appears to be syncope.  The exact etiology is not  clear.  As mentioned, the patient had a headache and then she had sudden  motion of her left arm, and then she lost consciousness for a prolonged  period of time.   Her ICD will be interrogated. A 2-D echo will be done to rule out unexpected  changes.  No other suggestions at this time.                                               Willa Rough, M.D.    Cleotis Lema  D:  07/24/2003  T:  07/24/2003  Job:  [id number]   cc:   Learta Codding, M.D.  [address]  [address]  [place]  [place] [address]   Idamae Schuller A. Milinda Antis, M.D. [facility]

## 2011-01-31 NOTE — H&P (Signed)
[name], [name]                              ACCOUNT NO.:  [id number]   MEDICAL RECORD NO.:  [id number]                   PATIENT TYPE:  INP   LOCATION:  2031                                 FACILITY:  [facility]   PHYSICIAN:  Titus Dubin. Alwyn Ren, M.D. [facility]         DATE OF BIRTH:  12-19-1923   DATE OF ADMISSION:  07/22/2003  DATE OF DISCHARGE:                                HISTORY & PHYSICAL   HISTORY OF PRESENT ILLNESS:  The patient is a 77 year old white female  admitted with loss of consciousness and mental status changes.  She was  sitting at the table eating breakfast with her daughter at approximately  8:30 this morning when she became unresponsive.  She was noted to tremble  all over and be non-communicative. There was no definite seizure stigmata  such as tongue-biting or bowel or urine incontinence.  Upon awakening, she  had memory loss for the event, and in fact, as of 2:30 had no memory of any  events until she was in the emergency room with her sons.  Following the  episode, she described a diffuse headache which has been an intermittent  problems for several months apparently and for which she has seen Marne A.  Milinda Antis, M.D. [facility].   She has also had recent hypoglycemic spells, but the lowest recorded value  was 90.  Because of this, she was being switched from Glyburide 3 mg to  Glipizide ER5 a long-acting or slow-release product.   There was no definite chest pain or dysrhythmias prior to the event.   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  Remotely, she had thyroid problems and was placed on  medication.  This apparently resulted in dysfunction menses for which she  had a total abdominal hysterectomy.  She has had a partial nephrectomy by  Valetta Fuller, M.D. for right renal malignancy.  She has had defibrillator  implanted by Dr. Andee Lineman for dysrhythmias.   MEDICATIONS:  1. Nasonex one spray each nostril daily as needed.  2. Neurontin 300 mg two twice a day for neuropathy.  3. Phenergan 25 mg as needed for nausea.  4. Allopurinol 300 mg for hyperuricemia.  5. Potassium 10 mEq daily.  6. Coreg 12.5 mg twice a day.  7. Altace 10 mg twice a day for hypertension and congestive heart failure.  8. Meclozine 12.5 mg three times a day as needed for dizziness.  9. Digoxin 125 mcg per day.  10.      Lasix 20 mg two daily.  11.      Calcium and multivitamin supplements.  12.      Coumadin 4 mg daily.   REVIEW OF SYSTEMS:  As outlined above.  Apparently the family has noted some  mental decline in the last several months.  She did have some nausea, but no  diaphoresis with the episode.  There was no definite chest pain.  When her  daughter asked about chest pain, the patient pulled away, but it was during  the unresponsive period.   FAMILY HISTORY:  Negative for diabetes.  Mother had CNS hemorrhage.  Father  had stroke.  Her daughter, primary historian and caregiver, had breast  cancer.  Her son died of esophageal cancer.   SOCIAL HISTORY:  She does not drink or smoke.   ALLERGIES:  She has no known drug allergies.   PHYSICAL EXAMINATION:  GENERAL:  She is a very pleasant, near octogenarian,  who is in no acute distress.  She answers all questions with an epithet of  angel or dearest or such other endearment.  VITAL SIGNS:  Blood pressure 160/70, pulse 97, respiratory rate 22.  There  is no increased work of breathing; she is on O2.  Arterial artery narrowing  is noted.  There is a suggestion of possible cataract on the right.  She is  completely edentulous and not wearing her dentures.  HEENT:  Oropharynx and nares are clear without pathology.  She has no  carotid bruits.  Thyroid is normal to palpation.  She has no lymphadenopathy  about the head, neck, or axilla.  She does have bilateral rales, but no  increased work of breathing.  An S3  gallop is suggested.  ABDOMEN:  Protuberant and nontender.  The patient has no edema.  Posterior  tibial pulses are decreased.  She is symmetrically weak to grip.  She  answers questions appropriately, but with minimal elaboration.   LABORATORY DATA:  Glucose of 250, hematocrit 33, hemoglobin 11.  Cardiac  enzymes are negative.   CT scan revealed no bleed.   She will be admitted to telemetry and monitored.   ADMISSION DIAGNOSIS:  Syncope with an etiology of ventricular tachycardia  versus seizure versus hypoglycemia.   She will be on a sliding scale insulin long term.  She may be a candidate  for Lantus insulin or an agent such as Starlix before meals which would not  result in hypoglycemia.  Other options could be Metformin, although, there  is some increased risk at her age.  It is noted that her creatinine is  normal.  Troglitazone drugs are probably not an option because of the drug  therapy for heart failure.   Cardiology consultation may be necessary depending on the acoumetric  monitoring.  A prolactin level will also be performed because of question of  seizure.                                                Titus Dubin. Alwyn Ren, M.D. [facility]    WFH/MEDQ  D:  07/22/2003  T:  07/22/2003  Job:  [id number]   cc:   Marne A. Milinda Antis, M.D. [facility]   Learta Codding, M.D.  [address]  [address]  [place]  [place] [address]

## 2011-01-31 NOTE — Assessment & Plan Note (Signed)
[facility]                         GASTROENTEROLOGY OFFICE NOTE   [name], [name]                           MRN:          [id number]  DATE:10/09/2006                            DOB:          04-Feb-1924    REFERRING PHYSICIAN:  Marne A. Tower, MD   REASON FOR CONSULTATION:  Diarrhea.   ASSESSMENT:  An 77 year old white woman with intermittent diarrhea that  has been a problem since she was admitted to the hospital with  gastroenteritis and acute renal failure in the Fall.  It is urgent and  associated with incontinence.  There is a previous history of colon  polyps.  She has an automatic implanted cardiac defibrillator and  numerous medical problems.   RECOMMENDATIONS AND PLAN:  Investigate with colonoscopy given the  previous history of polyps and these symptoms.  It might just be a post-  infectious irritable bowel problem, but I think given her age and the  symptoms and the history of previous polyps I need to exclude colorectal  neoplasia.  Note, there has been a little bit of bright red blood per  rectum off and on as well.  She is at somewhat high risk of problems  because of her aging comorbidities, but I do think it is a necessary  procedure.   HISTORY:  An 77 year old white woman was admitted to the hospital with  gastroenteritis and acute renal failure back in September.  Since that  time, she has had episodic diarrhea that is urgent and associated crampy  abdominal pain.  It comes and goes but lately it has been more frequent.  On January 11 she had a CBC and a CMET that were normal.  She has not  really had any new medications other than some adjustments in her blood  pressure medicines after her hospitalization.  There is a fair amount of  nausea with this.  She gets bloated and distended at times.  There does  not seem to be much constipation.  Stools have been dark but she has  been taking iron for her anemia.  Her weight did drop  and I think it  came up some after that.  The diarrhea sometimes awakens her in the  morning.  Her daughter is with her and noted that bananas might have  been contributing to it, then she improved after stopping those and  improved after stopping cucumbers and tomatoes as well.  The symptoms  are more frequent lately.   PAST MEDICAL HISTORY:  Hypertension, coronary artery disease with  myocardial infarction, diabetes mellitus, acute renal failure on  dialysis briefly, osteoarthritis, nephrolithiasis, renal cancer with  subtotal right nephrectomy, pacemaker defibrillator July 2003.   1. EGD and colonoscopy by me in 2003 with hiatal hernia, gastritis,      benign stomach polyp.  She has had adenomatous colon polyps May      2003, internal hemorrhoids and diverticulosis.  2. Abdominal pain, dilated bile duct, abnormal LFTs, negative ERCP      other than dilated bile duct June 2006.  3. Diabetes mellitus type 2.  4. Non-ischemic cardiomyopathy.  5. Complete heart block.  6. Previous history of polymorphic ventricular tachycardia.  7. Previous anemia.  8. Gout.  9. Dyslipidemia.   MEDICATIONS:  1. Nexium 40 mg daily.  2. Nasonex spray.  3. Humalog as directed.  4. Altace 5 mg daily.  5. Norvasc 2.5 mg a  day.  6. Digitek 0.125 mg daily.  7. Coreg 12.5 mg 1.5 tablets twice daily.  8. Ferrex 150 mg daily.  9. Gabapentin 600 mg b.i.d.  10.Meclizine p.r.n.  11.Promethazine p.r.n.  12.Darvocet p.r.n.   ALLERGIES:  None known.   SOCIAL HISTORY:  No tobacco or alcohol.  She is retired from farming.  She lives with her daughter.  She is widowed.   FAMILY HISTORY:  Diabetes, breast cancer, heart disease, no colon  cancer.   REVIEW OF SYSTEMS:  She has some fatigue and weakness but overall  manages well without any significant findings on the review of systems  today.   PHYSICAL EXAMINATION:  Reveals an 77 year old white woman in no acute  distress, looking stated age.  Height 5  feet 4 inches, weight 156  pounds, blood pressure 116/54, pulse 74.  EYES:  Anicteric.  EENT:  Edentulous.  NECK:  Supple, no thyromegaly or mass.  CHEST:  Clear, resonant.  HEART:  S1, S2, no murmurs, rubs or gallops.  ABDOMEN:  A small midline ventral hernia, soft, nontender, without  organomegaly or mass.  Rectal exam deferred.  LYMPHATIC:  No neck or supraclavicular nodes.  EXTREMITIES:  No peripheral edema.  PSYCH:  She is alert and oriented x3.  SKIN:  Warm, dry, signs of sun damage but no acute rash noted.   I appreciate the opportunity to care for this patient.  I have reviewed  the records and labs by Dr. Milinda Antis.  I have reviewed the records of  previous hospitalization.  I have reviewed the cardiology records in our  chart.     Iva Boop, MD,FACG  Electronically Signed    CEG/MedQ  DD: 10/09/2006  DT: 10/09/2006  Job #: [id number]   cc:   Marne A. Milinda Antis, MD

## 2011-01-31 NOTE — H&P (Signed)
[place]  Patient:    [name], [name] Visit Number: [id number] MRN: [id number]          Service Type: MED Location: [phone] Attending Physician:  Nathen May Dictated by:   Guy Franco, P.A. [facility] Admit Date:  04/06/2002                           History and Physical  PRIMARY CARDIOLOGIST:  Nathen May, M.D.  HISTORY OF PRESENT ILLNESS:  The patient comes in today complaining of left arm swelling.  She was hospitalized from March 29, 2002, to April 01, 2002, and during that hospitalization, she was noted to have complete heart block which resulted in syncope.  In addition, she had torsade de point, cardiomyopathy, and a history of previous syncope.  She underwent dual-chamber defibrillator implantation with intraoperative defibrillating threshold testing by Dr. Sherryl Manges.  Because of her left upper extremity swelling, we obtained an upper extremity venous duplex evaluation and this revealed an axillary, proximal to mid brachial, and basilic vein noncompressible with no venous flow detected consistent with upper extremity DVT.  The patient is now being admitted to [facility] for anticoagulation therapy.  PAST MEDICAL HISTORY:  Significant for hypertension, anti-DVM, hyperlipidemia, nonobstructive CAD, LV dysfunction with a history of CHF (EF 30 to 35%), left bundle branch block, complete heart block, syncope status post dual-chamber defibrillator implantation in July 2003.  ALLERGIES:  No known drug allergies.  SOCIAL HISTORY:  No tobacco, alcohol, or illicit drug use.  She has been widowed for a number of years and lives with her daughter in [place].  MEDICATIONS:  1. Premarin 0.625 mg a day.  2. Lasix 20 mg two tablets a day.  3. Baby aspirin daily.  4. Multivitamin daily.  5. Calcium 600 mg q.i.d.  6. Nasonex one spray each nostril q.d.  7. Glyburide 3 mg q.d.  8. Potassium 10 mEq a day.  9.  Allopurinol 300 mg per day. 10. Altace 5 mg b.i.d. 11. Digitek 0.25 mg a day.  Please note the patient has had stomach irritation with aspirin but is able to tolerate a baby aspirin well, has had no further stomach difficulties.  PHYSICAL EXAMINATION:  VITAL SIGNS:  Blood pressure 130/70, pulse 58.  HEENT:  Grossly normal.  No carotid or subclavian bruits.  No JVD or thyromegaly.  CHEST:  Clear to auscultation bilaterally.  No wheezing or rhonchi.  HEART:  Regular rate and rhythm.  No murmurs, rub, or ectopy,  ABDOMEN:  Good bowel sounds, nontender, nondistended, with no masses or bruits.  EXTREMITIES:  Lower extremity - no peripheral edema.  Her left upper extremity is enlarged, however, blood flow is intact.  Her defibrillator implantation site does show some swelling; however, the skin edges are well approximated. There is no evidence of drainage or redness.  ASSESSMENT:  1. Left upper extremity deep venous thrombosis.  2. History of syncope, torsade de point with implantable defibrillator in     July 2003 under the care of Dr. Berton Mount.  3. Hypertension, treated.  4. Postmenopausal, treated.  5. Nonobstructive coronary artery disease.  6. Nonischemic cardiomyopathy, ejection fraction 30 to 35%.  7. Diabetes mellitus, noninsulin dependent.  8. History of left bundle branch block.  PLAN:  The patient will be admitted, placed on IV heparin and Coumadin load, and patient will be admitted to the service of Dr. Berton Mount.  He will take over care for the patient tomorrow. Dictated by:   Guy Franco, P.A. [facility] Attending Physician:  Nathen May DD:  04/06/02 TD:  04/06/02 Job: [id number]

## 2011-01-31 NOTE — Assessment & Plan Note (Signed)
[facility]                                   ON-CALL NOTE   HEDI, BARKAN                             MRN:          [id number]  DATE:05/30/2006                            DOB:          Jan 10, 1924    ON CALL NOTE:  The patient's daughter called saying she has nausea and  vomiting and diarrhea.  Going to bring her in this morning for Saturday  clinic.                                   Lelon Perla, DO   YRL/MedQ  DD:  05/30/2006  DT:  06/01/2006  Job #:  [id number]   cc:   Marne A. Milinda Antis, MD

## 2011-01-31 NOTE — H&P (Signed)
[place]  Patient:    [name], [name] Visit Number: [id number] MRN: [id number]          Service Type: MED Location: [facility] [id number] Attending Physician:  Mirian Mo Dictated by:   Jesse Sans Wall, M.D. [facility] Admit Date:  03/29/2002   CC:         Steele Sizer, M.D., [place], [place]  Lewayne Bunting, M.D. [facility]   History and Physical  CHIEF COMPLAINT:  Extreme fatigue over the past month with two episodes of fainting, June 4th and July 8th, per the family.  HISTORY OF PRESENT ILLNESS:  The patient is a delightful, very sweet 77 year old widowed white female from [place] who has a history of hypertension, non-insulin-dependent diabetes, hyperlipidemia, nonobstructive coronary artery disease, LV dysfunction with a history of congestive heart failure (ejection fraction 30-35%), left bundle branch block pattern, who comes in with the above complaint.  EMS found her to be in complete heart block at a rate of 40.  Blood pressure has been stable since she arrived in the emergency room; it was initially low.  She is followed by Dr. Lewayne Bunting for congestive heart failure and nonobstructive coronary artery disease.  CURRENT MEDICATIONS:  1. Altace 5 mg p.o. b.i.d.  2. Nasonex one spray b.i.d.  3. Naprosyn 500 mg p.o. b.i.d.  4. Premarin 0.625 mg p.o. q.a.m.  5. Glyburide 3 mg p.o. q.a.m.  6. Allopurinol 300 mg a day.  7. Lasix 20 mg p.o. b.i.d.  8. Kay Ciel 10 mEq a day.  9. Aspirin 325 mg a day. 10. Digitek 0.125 mg a day.  ALLERGIES:  She has no known drug allergies.  PAST MEDICAL HISTORY:  She has a history of gout, right renal cell cancer, status post partial nephrectomy.  SOCIAL HISTORY:  She does not smoke or drink.  [place].  She has been widowed for a number of years.  She has a very close family.  REVIEW OF SYSTEMS:  Otherwise unremarkable other than the HPI.  PHYSICAL EXAMINATION:  GENERAL:  Her  exam shows her to be in no acute distress.  She is very pleasant and neurologically intact.  VITAL SIGNS:  Her blood pressure is 160/80.  Her pulse is 40 and she has a complete heart block pattern with a left anterior fascicular block and a right bundle branch block pattern.  Her respiratory rate is 18 and unlabored.  She is afebrile.  O2 saturations were 96%.  NECK:  No JVD.  Carotid upstrokes are equal bilaterally without bruits.  There is no thyromegaly.  Trachea is midline.  LUNGS:  Clear to auscultation and percussion.  HEENT:  Unremarkable.  HEART:  Slow rate and rhythm.  There is a soft systolic murmur at the left lower sternal border and left upper sternal border.  ABDOMEN:  Good bowel sounds.  Organomegaly was difficult to assess.  EXTREMITIES:  Her peripheral pulses are intact, both dorsalis pedis and posterior tibial.  There is no edema.  LABORATORY AND ACCESSORY DATA:  Chest x-ray shows mild cardiomegaly with mild vascular congestion.  Other blood work, Catering manager, is pending.  ASSESSMENT:  1. Syncope x2, weakness and fatigue for a month, now with complete heart     block with a right bundle and left anterior fascicular block.  The patient     has a history of a left bundle.  She is currently asymptomatic with a good  blood pressure, with transcutaneous pacer backup.  2. Nonobstructive coronary artery disease.  3. History of congestive heart failure with ejection fraction of 30-35%.  4. Type 2 diabetes, non-insulin dependent.  5. Hypertension.  6. Gout.  7. History of left bundle branch block pattern.   PLAN:  1. Admit to CCU.  2. Cycle enzymes.  3. Check TSH, free T4 and magnesium.  4. Transcutaneous backup.  5. Permanent pacemaker implantation.  6. Discontinue digoxin; digoxin level is pending. Dictated by:   Jesse Sans Wall, M.D. [facility] Attending Physician:  Mirian Mo DD:  03/29/02 TD:  03/31/02 Job: [id number]

## 2011-01-31 NOTE — H&P (Signed)
[facility]  Patient:    [name], [name] Visit Number: [id number] MRN: [id number]          Service Type: SUR Location: 1S X005 01 Attending Physician:  Thermon Leyland Dictated by:   Barron Alvine, M.D. Admit Date:  06/07/2001   CC:         Roxy Manns, M.D. [facility]  Lewayne Bunting, M.D.   History and Physical  CHIEF COMPLAINT:  Right renal mass.  HISTORY OF PRESENT ILLNESS:  Ms. Pe is a 77 year old female.  She had some hematuria which was thought possibly to be due to cystitis.  She had a non-contrast CT which showed an exophytic right renal mass.  We elected to do an IV contrast CT which did show enhancement, and this appeared to be a solid lesion.  There was no evidence of metastatic disease.  We talked with the patient at length about the implications of this.  We felt that given the solid nature of this lesion that there was an extremely high likelihood that this was a renal cell carcinoma.  We talked about the advantages and disadvantages of immediate treatment versus delayed observation and watchful waiting.  I do not think that this was likely to be the cause of her hematuria and abdominal pain, and she very well may of had a small stone which subsequently passed or possibly some cystitis.  We do not feel that given the exophytic nature of this lesion and its relatively small size that it was very likely to have caused some gross hematuria.  The patient does have some cardiac history.  She was cleared from a medical and cardiac standpoint.  She is going to be admitted for routine postoperative care status post flank exploration and probable partial nephrectomy this afternoon.  She is now asymptomatic.  PAST MEDICAL HISTORY: 1. Hypertension. 2. Non-insulin-dependent diabetes mellitus. 3. Hypercholesterolemia. 4. Known ischemic cardiomyopathy with an ejection fraction in approximately 30    to 35% range.  CURRENT MEDICATIONS: 1.  Premarin. 2. Glyburide. 3. Lanoxin. 4. Lasix. 5. Allopurinol. 6. Potassium replacement. 7. Altace. 8. Plavix, which has been on hold. 9. Daily aspirin.  ALLERGIES:  No known drug allergies.  SOCIAL HISTORY:  Otherwise noncontributory.  FAMILY HISTORY:  Otherwise noncontributory.  REVIEW OF SYSTEMS:  Also negative.  She has had no further episodes of hematuria.  PHYSICAL EXAMINATION:  GENERAL:  She is a well-developed, well-nourished female.  VITAL SIGNS:  Her weight is 159 pounds.  She is approximately 5 feet tall. She is afebrile.  Her pulse is 96.  Her blood pressure is 160/90.  NECK:  No JVD or masses.  CHEST:  Clear to auscultation.  ABDOMEN:  Soft, protuberant, without obvious masses or tenderness.  EXTREMITIES:  No edema.  LABORATORY DATA:  Hemoglobin was 11.6, creatinine preoperatively is 1.1 with a blood glucose of 103.  Urinalysis is unremarkable.  ASSESSMENT:  Right renal mass.  PLAN:  The patient will be undergoing right flank exploration with probable partial nephrectomy and possible radical nephrectomy this afternoon.  Will be admitted for routine postoperative care. Dictated by:   Barron Alvine, M.D. Attending Physician:  Thermon Leyland DD:  06/07/01 TD:  06/07/01 Job: [id number]

## 2011-01-31 NOTE — Discharge Summary (Signed)
[facility]  Patient:    [name], [name] Visit Number: [id number] MRN: [id number]          Service Type: SUR Location: 3W 0347 02 Attending Physician:  Thermon Leyland Dictated by:   Barron Alvine, M.D. Admit Date:  06/07/2001 Disc. Date: 06/10/01   CC:         Roxy Manns, M.D. [facility]  Lewayne Bunting, M.D.   Discharge Summary  DISCHARGE DIAGNOSIS:  Renal cell carcinoma.  PROCEDURE PERFORMED:  Right flank exploration and partial nephrectomy.  HOSPITAL COURSE:  Ms. Bardon is a 77 year old female.  She had nonspecific abdominal pain, and a CT scan revealed an exophytic renal mass.  Followup contrast CT revealed enhancement of this lesion.  This was felt to represent a probable exophytic renal cell carcinoma.  She had no evidence of metastatic disease.  We obtained preoperative medical/cardiac clearance.  On 06/07/01, the patient underwent flank exploration.  A solid mass was noted emanating off the lower pole of the right kidney.  The gross appearance of this was consistent with renal cell carcinoma.  We felt this was amenable to partial nephrectomy which was performed without incidence.  The patients operative day and postop courses were really unremarkable.  She did have a borderline low hemoglobin preoperatively of approximately 11.  Postoperatively, her hemoglobin remained stable in the 8.3 to 8.5 range, and she was asymptomatic with normal vital signs.  Final pathology revealed a renal cell carcinoma.  The margins were negative.  The tumor size was approximately 3 cm, indicating a T1 tumor.  The patient is discharged home on postoperative day #3.  She is tolerating a general diet well and ambulating.  She has minimal pain at this time with normal vital signs and is afebrile, and has an unremarkable exam.  DISPOSITION:  The patient will be discharged to home.  DISCHARGE MEDICATIONS:  She will be given a prescription for Vicodin.  FOLLOWUP:  She  will return to our office early next week for staple removal and a repeat CBC check.  She is given routine postoperative instructions. Dictated by:   Barron Alvine, M.D. Attending Physician:  Thermon Leyland DD:  06/10/01 TD:  06/10/01 Job: [id number]

## 2011-01-31 NOTE — Procedures (Signed)
[place]  Patient:    [name], [name] Visit Number: [id number] MRN: [id number]          Service Type: MED Location: 2000 2034 01 Attending Physician:  Mirian Mo Dictated by:   Arturo Morton Riley Kill, M.D. [facility] Proc. Date: 03/29/02 Admit Date:  03/29/2002 Discharge Date: 04/01/2002   CC:         Thomas C. Wall, M.D. [facility]  CV Laboratory   Procedure Report  INDICATIONS: A 77 year old female who presents with complete heart block and also torsades related ventricular fibrillation. The patient was seen by Dr. Antoine Poche and subsequently brought to the catheterization lab for temporary transvenous pacer insertion.  DESCRIPTION OF PROCEDURE: The procedure was performed from the right femoral vein after sterile preparation. A 6 French sheath was placed in the right femoral vein. A temporary transvenous pacing wire was passed from the right femoral vein under fluoroscopy to the RV apex. It was tested, found to be appropriate, and secured into place with two separate sutures. She experienced no chest pain and no complications. The mA was set at 5 with a rate of 65 in demand mode. She was taken to the holding area in satisfactory clinical condition.  CONCLUSIONS: Successful insertion of a temporary transvenous pacer. Dictated by:   Arturo Morton Riley Kill, M.D. [facility] Attending Physician:  Mirian Mo DD:  03/29/02 TD:  04/01/02 Job: [id number]

## 2011-01-31 NOTE — Cardiovascular Report (Signed)
[name], [name]                              ACCOUNT NO.:  [id number]   MEDICAL RECORD NO.:  [id number]                   PATIENT TYPE:  OIB   LOCATION:  3714                                 FACILITY:  [facility]   PHYSICIAN:  Duke Salvia, M.D.               DATE OF BIRTH:  Jun 24, 1924   DATE OF PROCEDURE:  09/27/2003  DATE OF DISCHARGE:                              CARDIAC CATHETERIZATION   DESCRIPTION OF PROCEDURE:  Following informed consent the patient was  brought to the electrophysiology laboratory and placed on the fluoroscopic  table.  Prior venogram had suggested patency of the extrathoracic left  subclavian vein.  However, repeat venogram demonstrated, in fact, this vein  was occluded and there was a proximity of the veins on either side of the  occlusion.  After routine prep and drape lidocaine was infiltrated in the  prepectoral subclavicular region and incision was made over the previous  incision, carried down to the layer of the device pocket with sharp  dissection.  Because the patient was pacemaker dependent the device was left  in place at this time.   Attention was turned to gain access of the subclavian vein which was  accomplished by a traditional subclavicular puncture.  No air was aspirated.  A guidewire was placed and a Wholey wire was then used to pass the wire past  the junction of the innominate in the superior vena cava.  This was  accomplished without difficulty.  The coronary sinus was then cannulated  with an extended hook CS sheath without difficulty.  Occlusive venography  was obtained which demonstrated an anterolateral branch and a mid lateral  branch, the latter of which had a very sharp takeoff and a posterior branch  emanating from the origin of the middle cardiac vein.   Initially, the anterolateral branch was searched and pacing thresholds in  this area could not be obtained in a bipolar configuration.   We then turned with a Rapido catheter  to try to cannulate the lateral  branch.  Despite numerous attempts, we were able to cannulate only very  proximal portion of this before there was prolapse of the wire.  We then  maintained position of the coronary sinus with a Wholey wire through the  sheath to the proximal coronary sinus and attempted to cannulate the  posterior branches originating near the coronary sinus os.  We were able  again to cannulate one of these branches but it was both quite medial and,  again, associated with a very sharp takeoff.  This was subsequently  abandoned.   We then went back up to the anterolateral portion to see whether another  branch could be identified and, in fact, we did so and in a unipolar  configuration.  The bipolar L-wave was about 5 millivolts with a pacing  threshold of about 4 volts at 1 millisecond.  This  being the best we could  do, we decided to place the lead here.  The 9-French ___________ introducer  sheath was removed and then the CS delivery sheath was removed.  At this  point the lead appeared not pace at all but it turned out the impedance was  wrong because I had hooked up the unipolar configuration wrong and, indeed,  the lead now paced with a threshold of 1.8 volts at 1 millisecond with a  pace impedance of 967 ohms.  The RV impedance was 893 with a pacing  threshold of 0.6 volts at 0.5 milliseconds.  The P-wave was 2.8 millivolts  and a threshold of 1.2 volts at 0.5 milliseconds with impedance of 446 ohms.  I should note a couple of things which I failed to note earlier.  First is  that the lead that was used was a Guidant EasyTrak [id number] lead serial [id number].  Numerous wires were used for the reasons outlined above.   The leads were attached and these frontal measures were interrogated through  a Guidant contact renewal 3H175 dual coil CRT defibrillator serial [id number].   At this point the pocket was copiously irrigated with antibiotic containing  saline solution.  The  pocket had to be enlarged because of the different  shape of this device.  Hemostasis was obtained and the leads and the pulse  generator then placed in the pocket.   Defibrillation threshold testing was then undertaken.  Ventricular  fibrillation was induced via the T-wave shock.  After a total duration of 6  seconds a 14 joule shock was delivered through a measured resistance of 40  ohms terminating ventricular fibrillation, restoring sinus rhythm in an AV  paced configuration.   At this point the pocket was closed in three layers in normal fashion.  The  wound was washed, dried, and a Benzoin Steri-Strip dressing was applied.  Needle counts, sponge counts, and instrument counts were correct at end of  procedure, according to staff.   The patient tolerated the procedure without apparent complication.                                               Duke Salvia, M.D.    SCK/MEDQ  D:  09/27/2003  T:  09/28/2003  Job:  [id number]   cc:   EP Lab   [facility]

## 2011-01-31 NOTE — Consult Note (Signed)
[name], [name]                    ACCOUNT NO.:  [id number]   MEDICAL RECORD NO.:  [id number]          PATIENT TYPE:  INP   LOCATION:  4706                         FACILITY:  [facility]   PHYSICIAN:  Aram Beecham B. Eliott Nine, M.D.DATE OF BIRTH:  May 15, 1924   DATE OF CONSULTATION:  06/04/2006  DATE OF DISCHARGE:                                   CONSULTATION   We are asked to see this patient because of acute renal failure.  This is an  77 year old white female who resides with her daughter, [name],  normally a very functional woman, who has a history of a partial right  nephrectomy back in 2002 for renal cell cancer.  Her baseline renal function  in March of 2007 was a creatinine of 0.9.  She is admitted by the [facility]  Medicine Service today with several days of nausea, vomiting and diarrhea.  Her laboratory in the emergency room was notable for a serum creatinine of  10, bicarb of 16 and a urinalysis which was fairly bland showing 30 mg%  protein, 0-2 white cells and 0-2 red cells.   Her daughter, [name] who watches her very tenacious, had been  monitoring blood pressures at home during this diarrheal illness and noted  they were as low as the 70s to 80s systolic from the 15th through the 17th  and on the 19th as low as 79/55.  At the recommendation of her primary care  physician, when blood pressure was low on the 19th, she was instructed to  hold Altace and Coreg which she did, although she gave Altace and Coreg this  morning because blood pressure was back up.   The patient's daughter states that [name] has had no urine output since  Sunday.  She has been experiencing excessive somnolence, confusion and  hallucinations and normally is a quite functional woman so this represents a  marked difference for her.   She did as noted above continue to receive her Altace for hypertension  throughout her GI illness with the exception of June 03, 2006.   There has been no use of  nonsteroidal anti-inflammatory drugs.   PAST MEDICAL HISTORY:  1. History of partial right nephrectomy in 2002 by Dr. Isabel Caprice for renal      cell cancer.  2. Prior history of acute renal failure June 2006 related to volume      depletion and diarrhea-creatinine at discharge was 1.0.  3. History of left complex renal cyst.  4. History of ischemic cardiomyopathy with EF of 30-35% with ICD/pacer in      place.  5. NIDDM.  6. Gout.  7. History of total abdominal hysterectomy.  8. History of left upper extremity DVT in 2003.  9. History of nonobstructive coronary disease by cath.  10.History of hypertension.  11.Dyslipidemia.  12.History of GI bleeding in the past (question year).   The daughter is not aware of any true drug allergies, although the admitting  physicians indicate AVELOX, TRICOR, LIPITOR, NIASPAN and METFORMIN as drugs  that she cannot take.   Her outpatient  medicines included Coreg 12.5 mg one and a half tabs b.i.d.  held on June 03, 2006,  Digitek 0.0625 mg a day, allopurinol 300 mg a  day, furosemide 20 mg a day, Altace 5 mg a day also held on June 03, 2006, gabapentin 300 mg 2 b.i.d., Nexium 40 mg a day, Darvocet, meclizine,  Nasonex.  Her current medicines are Coreg 12.5 mg b.i.d. to hold for  systolic less than 110, Neurontin 600 b.i.d., Protonix 40 daily, Nasonex and  Darvocet.   Family history is noncontributory.   SOCIAL HISTORY:  The patient is a widow.  Husband died in 94.  She had six  children-one of whom is deceased.  She lives with her oldest daughter who  takes care of her.  She does not smoke or drink and according to her  daughter normally is extremely functional.   REVIEW OF SYSTEMS:  Per the daughter is positive for confusion, fatigue,  decreased urine output for at least 5 days, nausea, vomiting, diarrhea,  abdominal distension.  She has had no rash.  No bleeding.   On physical exam, she is a pale-appearing white female who is  confused but  extremely pleasant.  Blood pressure is 99/60.  She is afebrile.  There is no  rash noted.  She had no JVD in the supine position.  Cardiac exam S1-S2, no  S3.  Lungs were grossly clear.  Her pacer defibrillator was in the left  anterior chest and was noninflamed and nontender.  Abdomen was protuberant  and in fact somewhat distended and firm.  She had a right flank scar.  There  was no abdominal mass or focal area of tenderness.  There was no edema of  the lower extremities.  The patient was not oriented but was very pleasant.  She was picking at the sheets and apologizing for her behaviors.  She  followed commands appropriately.   Laboratory is limited to I-stat 8 which showed a pH of 7.26, pCO2 of 32,  potassium 4.7, glucose 115 and bicarb of 16.  Creatinine was 10.1.  Hemoglobin 9.5.  Urinalysis specific gravity 1.017, 30 mg% protein, 0-2 red  cells, 0-2 white cells.   IMPRESSION:  This is an 77 year old white female with a history of a partial  nephrectomy in the past for renal cell cancer with baseline creatinine which  was normal in March 2007 at 1.0 who has had a recent gastrointestinal  illness with nausea, vomiting and diarrhea and has developed volume  depletion.  Along with this, she has had significant hypotension documented  by her daughter with systolic blood pressures in the 70-80 range and has  been receiving continued ACE inhibitor therapy with the exception of 1 day.  This is all likely hemodynamic acute tubular necrosis-acute renal failure  with metabolic acidosis.  She also has a very significant anemia, more so  than I would expect in a patient who simply has acute and not chronic renal  failure.   RECOMMENDATIONS:  1. Agree with IV fluids but would use fluids containing sodium      bicarbonate.  Will change her normal saline after she receives her     bolus to D5 and a quarter with 2 ampules of bicarb per liter.  Place      Foley catheter.  Check  renal ultrasound.  2. Hold ACE inhibitor.  3. Hold other blood pressure medicines if systolic is under 130.  4. Hold Neurontin, 600 mg b.i.d. is a large  dose for a patient whose      current GFR is less than 10 and could be contributing to her mental      status.  5. As above check renal ultrasound and would also get an SPEP and UPEP.  6. May require short-term dialysis.  Will see how she responds IV fluid      hydration.  This was discussed with the      family and the daughter indicates that they would definitely want      dialysis done should it be necessary.  7. Would check stools and get iron studies regarding her anemia and the      SPEP and UPEP as mentioned above.   Thanks for asking [place] to see this patient.  Will follow very closely with  you.           ______________________________  Duke Salvia. Eliott Nine, M.D.     CBD/MEDQ  D:  06/04/2006  T:  06/06/2006  Job:  [id number]

## 2011-01-31 NOTE — Discharge Summary (Signed)
[name], [name]                    ACCOUNT NO.:  [id number]   MEDICAL RECORD NO.:  [id number]          PATIENT TYPE:  INP   LOCATION:  5123                         FACILITY:  [facility]   PHYSICIAN:  Valerie A. Felicity [name], MDDATE OF BIRTH:  [birth year]   DATE OF ADMISSION:  06/04/2006  DATE OF DISCHARGE:  06/17/2006                                 DISCHARGE SUMMARY   DATE OF ADMISSION:  June 04, 2006   DATE OF DISCHARGE:  June 17, 2006   DISCHARGE DIAGNOSES:  1. Acute renal failure.  2. Diabetes type 2.  3. Pneumonia.  4. Iron deficiency anemia.   HISTORY OF PRESENT ILLNESS:  [name] is an 77 year old white female who was  admitted on June 04, 2006, with chief complaint of diarrhea.  The  diarrhea had reportedly started 6 days prior to admission and was  accompanied by vomiting.  She was seen by Dr. Marliss Czar May 30, 2006,  and was given a prescription for promethazine and Cipro.  She had a  temperature of 100.4 at that time.  The daughter reported no urine output  from Friday, May 29, 2006, until Tuesday, June 01, 2006, however,  reported that the patient was currently voiding.  The daughter also noted  increased confusion since the day prior to admission, which was accompanied  by visual hallucinations.  Nausea, vomiting, and diarrhea, reportedly  stopped on May 31, 2006, and the patient has been tolerating p.o.  since then.  She was admitted for further evaluation and treatment.   PAST MEDICAL HISTORY:  1. ERCP June 2006 for abnormal LFTs and dilated common bile duct.  2. Complex left renal cyst.  3. Ischemic cardiomyopathy, status post ICD/pacer 2005.  4. Diabetes type 2.  5. Hypertension.  6. Normocytic anemia.  7. Dementia with sundowning.  8. Gout.  9. Acute renal insufficiency, status post June 2006 hospitalization with a      creatinine of 1.0 at discharge.  10.History of balance dysfunction (inner ear).  11.Status post total abdominal  hysterectomy in 1994.  12.Status post right partial nephrectomy.  13.ICD/pacer 2003 with revision in 2005.  14.Cholecystectomy in 1995 per Dr. Daphine Deutscher.  15.Partial resection of right kidney 2002.   COURSE OF HOSPITALIZATION:  Problem #1:  Acute renal failure, dehydration,  and ACE inhibitor.  The patient was admitted and was noted on admission to  have a creatinine of 10.1.  A CT of the head was performed at that time  which showed no acute changes.  She was seen in consultation by Dr. Eliott Nine  of the renal team, where she was treated for acidosis with IV bicarbonate.  She subsequently underwent hemodialysis secondary to uremic symptoms, which  continued through June 11, 2006.  Since that time, her creatinine has  continued to trend downward with good urine output.  We will continue to  hold the patient's Lasix and allopurinol and defer resuming these  medications to Dr. Milinda Antis.  She will be maintained off of her ACE inhibitor.  The patient did develop a low grade fever and there was question  of  pneumonia.  She was treated with IV antibiotics, which were completed on  June 15, 2006.   Problem #2:  Iron deficiency anemia.  The patient was given IV iron during  this admission per renal team.   Problem #3:  History of CHF/ischemic cardiomyopathy.  The patient appears  stable at her baseline.   MEDICATIONS AT DISCHARGE:  1. Neurontin 600 mg p.o. b.i.d.  2. Nexium 40 mg p.o. daily.  3. Digoxin 0.062 mg p.o. daily.  4. Nasonex 1 spray each nostril daily.  5. Humalog 75/25, twenty units in the morning, 22 units in the evening.  6. Nu-Iron 150 mg p.o. daily.  7. Coreg 18.75 mg p.o. b.i.d.  8. Norvasc 5 mg p.o. daily.  9. Darvocet-N 100 one tab every 4-6 hours as needed for pain.  10.Meclizine 12.5 mg one to two tabs p.o. 3 times daily as needed.   The patient instructed to discontinue Altace and to hold Lasix and  allopurinol until followup with Dr. Milinda Antis.   PERTINENT  LABORATORIES AT DISCHARGE:  BUN 25, creatinine 1.8, hemoglobin  10.2, hematocrit 30.6.   CONDITION AT TIME OF DISCHARGE:  Medically improved.   DISPOSITION:  Plan to transfer the patient to home with home health PT and  OT.   FOLLOWUP:  The patient is instructed to follow up with Dr. Roxy Manns on  June 23, 2006, at 12 noon.  She is instructed to drink plenty of fluids  to avoid dehydration over the next several days and to call Dr. Milinda Antis or go  to the emergency room should she develop weakness, notice decrease in urine,  or develop shortness of breath.     ______________________________  Sandford Craze, NP      Christina Rover. Felicity Coyer, MD  Electronically Signed    MO/MEDQ  D:  06/17/2006  T:  06/17/2006  Job:  [id number]   cc:   Marne A. Milinda Antis, MD

## 2011-02-03 ENCOUNTER — Other Ambulatory Visit: Payer: Self-pay

## 2011-02-03 MED ORDER — FUROSEMIDE 20 MG PO TABS: 40.0000 mg | ORAL_TABLET | ORAL | Status: DC

## 2011-02-03 NOTE — Telephone Encounter (Signed)
Pt already has appt scheduled to see Dr Milinda Antis in 02/2011.

## 2011-02-13 ENCOUNTER — Ambulatory Visit (INDEPENDENT_AMBULATORY_CARE_PROVIDER_SITE_OTHER): Payer: Medicare Other | Admitting: *Deleted

## 2011-02-13 ENCOUNTER — Other Ambulatory Visit: Payer: Self-pay

## 2011-02-13 DIAGNOSIS — I428 Other cardiomyopathies: Secondary | ICD-10-CM

## 2011-02-18 ENCOUNTER — Other Ambulatory Visit: Payer: Self-pay | Admitting: Radiology

## 2011-02-18 ENCOUNTER — Other Ambulatory Visit: Payer: Self-pay | Admitting: Family Medicine

## 2011-02-18 DIAGNOSIS — I10 Essential (primary) hypertension: Secondary | ICD-10-CM

## 2011-02-19 NOTE — Progress Notes (Signed)
ICD remote 

## 2011-02-20 ENCOUNTER — Other Ambulatory Visit (INDEPENDENT_AMBULATORY_CARE_PROVIDER_SITE_OTHER): Payer: Medicare Other

## 2011-02-20 DIAGNOSIS — E119 Type 2 diabetes mellitus without complications: Secondary | ICD-10-CM

## 2011-02-20 DIAGNOSIS — I10 Essential (primary) hypertension: Secondary | ICD-10-CM

## 2011-02-20 LAB — LIPID PANEL
Cholesterol: 166 mg/dL (ref 0–200)
HDL: 38.5 mg/dL — ABNORMAL LOW (ref >39.00)
VLDL: 71.2 mg/dL — ABNORMAL HIGH (ref 0.0–40.0)

## 2011-02-20 LAB — CBC WITH DIFFERENTIAL/PLATELET
Basophils Absolute: 0 10*3/uL (ref 0.0–0.1)
Eosinophils Absolute: 0.1 10*3/uL (ref 0.0–0.7)
Lymphocytes Relative: 29.4 % (ref 12.0–46.0)
MCHC: 33.9 g/dL (ref 30.0–36.0)
Neutrophils Relative %: 64.2 % (ref 43.0–77.0)
Platelets: 170 10*3/uL (ref 150.0–400.0)
RBC: 4.26 Mil/uL (ref 3.87–5.11)
RDW: 14.2 % (ref 11.5–14.6)

## 2011-02-20 LAB — HEPATIC FUNCTION PANEL
Albumin: 4.2 g/dL (ref 3.5–5.2)
Alkaline Phosphatase: 82 U/L (ref 39–117)
Total Protein: 7 g/dL (ref 6.0–8.3)

## 2011-02-20 LAB — RENAL FUNCTION PANEL
Albumin: 4.2 g/dL (ref 3.5–5.2)
BUN: 22 mg/dL (ref 6–23)
Chloride: 102 mEq/L (ref 96–112)
Phosphorus: 3.6 mg/dL (ref 2.3–4.6)

## 2011-02-20 LAB — LDL CHOLESTEROL, DIRECT: Direct LDL: 63.2 mg/dL

## 2011-02-22 ENCOUNTER — Encounter: Payer: Self-pay | Admitting: Family Medicine

## 2011-02-25 ENCOUNTER — Ambulatory Visit (INDEPENDENT_AMBULATORY_CARE_PROVIDER_SITE_OTHER): Payer: Medicare Other | Admitting: Family Medicine

## 2011-02-25 ENCOUNTER — Encounter: Payer: Self-pay | Admitting: Family Medicine

## 2011-02-25 DIAGNOSIS — M899 Disorder of bone, unspecified: Secondary | ICD-10-CM

## 2011-02-25 DIAGNOSIS — N19 Unspecified kidney failure: Secondary | ICD-10-CM

## 2011-02-25 DIAGNOSIS — E119 Type 2 diabetes mellitus without complications: Secondary | ICD-10-CM

## 2011-02-25 DIAGNOSIS — M949 Disorder of cartilage, unspecified: Secondary | ICD-10-CM

## 2011-02-25 DIAGNOSIS — K219 Gastro-esophageal reflux disease without esophagitis: Secondary | ICD-10-CM

## 2011-02-25 DIAGNOSIS — E7800 Pure hypercholesterolemia, unspecified: Secondary | ICD-10-CM

## 2011-02-25 DIAGNOSIS — E0590 Thyrotoxicosis, unspecified without thyrotoxic crisis or storm: Secondary | ICD-10-CM

## 2011-02-25 DIAGNOSIS — I10 Essential (primary) hypertension: Secondary | ICD-10-CM

## 2011-02-25 DIAGNOSIS — E785 Hyperlipidemia, unspecified: Secondary | ICD-10-CM

## 2011-02-25 MED ORDER — ESOMEPRAZOLE MAGNESIUM 40 MG PO CPDR: 40.0000 mg | DELAYED_RELEASE_CAPSULE | Freq: Every day | ORAL | Status: DC

## 2011-02-25 MED ORDER — FUROSEMIDE 20 MG PO TABS: 40.0000 mg | ORAL_TABLET | ORAL | Status: DC

## 2011-02-25 NOTE — Assessment & Plan Note (Signed)
Trig up poss from higher sugar readings Disc low glycemic and low fat diet Rev labs Re check 6 mo and f/u

## 2011-02-25 NOTE — Assessment & Plan Note (Signed)
bp improved 2nd check to 138/70 No med changes Continue f/u with cardiology Rev labs F/u 6 mo

## 2011-02-25 NOTE — Assessment & Plan Note (Signed)
Is baseline if not dehydrated or with uti Nl cr today Rev this with pt

## 2011-02-25 NOTE — Assessment & Plan Note (Signed)
Less predictable lately  Continued f/u with Dr Talmage Nap Continued f/u with podiatrist (next mo)

## 2011-02-25 NOTE — Assessment & Plan Note (Signed)
Asymptomatic on nexium This was refilled

## 2011-02-25 NOTE — Patient Instructions (Signed)
I'm glad you are doing well  Schedule fasting labs and then follow up in 6 months Stay active  Keep watching diet for fats and sugar Blood pressure improved on 2nd check today  I sent px to the pharmacy

## 2011-02-25 NOTE — Assessment & Plan Note (Signed)
No problems now- tx tsh No symptoms Rev with pt

## 2011-02-25 NOTE — Progress Notes (Signed)
Subjective:    Patient ID: [name], female    DOB: July 25, 1924, 77 y.o.   MRN: [id number]  HPI Here for f/u of HTN and lipids and hypothyroid and DM Has felt pretty good lately - more energy for the last few days  Gets outdoors when not so hot - and stays as active as she can  No new medical problems   Renal fxn cr.9 this time  Makes good effort to drink water  No urinary symptoms at all  HTN - bp up at 152/64 Has been up a bit recently- ? Know why Nothing has changed No change in weight  No ha or cp or palpitations Saw cardiol in march - Dr Tenny Craw- thought she was doing great with ok bp  In past tried to go up on amlodipine- did not work out for some reason (? If her bp went too low)   Lipids showed inc in trig with 356 but good LDL 63 Lab Results  Component Value Date   CHOL 166 02/20/2011   CHOL 152 08/26/2010   CHOL 156 02/18/2010   Lab Results  Component Value Date   HDL 38.50* 02/20/2011   HDL 40.98* 08/26/2010   HDL 33.30* 02/18/2010   No results found for this basename: LDLCALC   Lab Results  Component Value Date   TRIG 356.0* 02/20/2011   TRIG 238.0* 08/26/2010   TRIG 442.0* 02/18/2010   Lab Results  Component Value Date   CHOLHDL 4 02/20/2011   CHOLHDL 5 08/26/2010   CHOLHDL 5 02/18/2010   Lab Results  Component Value Date   LDLDIRECT 63.2 02/20/2011   LDLDIRECT 75.3 08/26/2010   LDLDIRECT 53.8 02/18/2010     Thyroid stable No clinical symptoms Lab Results  Component Value Date   TSH 1.15 02/20/2011     DM sugar 174 Lab Results  Component Value Date   HGBA1C 8.2* 02/26/2009   sees endo- sugars have been crazy lately Sees Dr Talmage Nap  Up and down - is sometimes adj insulin  Last a1c Dr Talmage Nap 6.8   Wt is stable   Patient Active Problem List  Diagnoses  . HYPERTHYROIDISM  . DIABETES MELLITUS, TYPE II  . HYPERLIPIDEMIA  . GOUT  . HYPERTENSION  . CORONARY ARTERY DISEASE  . CARDIOMYOPATHY, SECONDARY  . AV BLOCK, COMPLETE  . SYSTOLIC HEART FAILURE, CHRONIC   . ANEURYSM, SPLENIC ARTERY  . ALLERGIC RHINITIS  . IRRITABLE BOWEL SYNDROME  . RENAL FAILURE  . UTI'S, CHRONIC  . OSTEOARTHRITIS  . OSTEOPENIA  . COLONIC POLYPS, ADENOMATOUS, HX OF  . AUTOMATIC IMPLANTABLE CARDIAC DEFIBRILLATOR SITU  . GERD (gastroesophageal reflux disease)   Past Medical History  Diagnosis Date  . Diabetes mellitus     Type II  . Hypertension   . Gout   . Allergy     allergic rhinitis  . CAD (coronary artery disease)     pulmonary hypertension// Cardiac cath 2001//2004 carotid dopplers Normal  . CHF (congestive heart failure)     non ish. cardiomyopathy// Dr. Tenny Craw cardiologist  . Cancer 2002    Renal cell cancer partial nephrectomy  . GERD (gastroesophageal reflux disease) 2003    EGD polyp; gastritis HH//Dr. Leone Payor GI  . Diverticula of intestine 2003    colonoscopy polyp; hem; divertic  . Cataract 2008    Right eye  . Personal history of kidney stones   . Hyperlipidemia     TRIG  . Osteopenia 2002    Dexa  .  IBS (irritable bowel syndrome)   . Choledocholithiasis   . Transfusion history 1982    PUD/Bleed/Transfusion  . Osteoarthritis 07/12/2007/ 10/2004    spine and knees// LS Degen. changes   . Chronic kidney disease 05/2006    Renal failure ; hospitalized   Past Surgical History  Procedure Date  . Eye surgery 2008    Right eye cataract  . Cystoscopy w/ stone manipulation 2010- 2011    ERCPS and clearing of choledocholithiasis  . Ercp 2010  . Colonoscopy 09/2006    polyps  . Abdominal hysterectomy 1999    hysterectomy with bladder tack   History  Substance Use Topics  . Smoking status: Never Smoker   . Smokeless tobacco: Not on file  . Alcohol Use: No   Family History  Problem Relation Age of Onset  . Cancer Daughter     breast cancer  . Cancer Son 39    Esophageal Cancer  . Heart disease Daughter    Allergies  Allergen Reactions  . Aspirin     REACTION: stomach upset  . Atorvastatin     REACTION: muscle pain  .  Celecoxib     REACTION: stomach upset  . Fenofibrate     REACTION: muscle pain  . Metformin     REACTION: diarrhea  . Moxifloxacin     REACTION: stomach upset  . Niacin     REACTION: muscle pain   Current Outpatient Prescriptions on File Prior to Visit  Medication Sig Dispense Refill  . acetaminophen (TYLENOL) 325 MG tablet OTC as directed       . amLODipine (NORVASC) 5 MG tablet TAKE 1/2 TABLET BY MOUTH TWICE A DAY  30 tablet  5  . BD ULTRA-FINE LANCETS lancets Use to check glucose once daily and in the evening       . carvedilol (COREG) 12.5 MG tablet 1 and 1/2 tablet by mouth two times daily       . Cholecalciferol (VITAMIN D3) 1000 UNITS CAPS Take 1,000 Units by mouth daily.        Marland Kitchen dicyclomine (BENTYL) 10 MG capsule 1 to 2 tablets by mouth ac meals       . gabapentin (NEURONTIN) 300 MG capsule Take 2 capsules (600 mg total) by mouth 2 (two) times daily.  120 capsule  11  . glucose blood test strip Test two times a day as directed       . insulin lispro protamine-insulin lispro (HUMALOG MIX 75/25 PEN) (75-25) 100 UNIT/ML SUSP Inject 8 Units into the skin 2 (two) times daily with a meal.        . loperamide (LOPERAMIDE A-D) 2 MG tablet As needed       . ramipril (ALTACE) 10 MG tablet Take 10 mg by mouth daily.        . simvastatin (ZOCOR) 20 MG tablet Take 20 mg by mouth at bedtime.        . Olopatadine HCl (PATADAY) 0.2 % SOLN 1 drop into each eye two times a day       . Probiotic Product (HEALTHY COLON) CAPS Take one by mouth daily            Review of Systems Review of Systems  Constitutional: Negative for fever, appetite change, fatigue and unexpected weight change.  Eyes: Negative for pain and visual disturbance.  Respiratory: Negative for cough and shortness of breath.   Cardiovascular: Negative.for cp or palp   Gastrointestinal: Negative for nausea, diarrhea and constipation.  Genitourinary: Negative for urgency and frequency.  Skin: Negative for pallor. or rash    Neurological: Negative for weakness, light-headedness, numbness and headaches.  Hematological: Negative for adenopathy. Does not bruise/bleed easily.  Psychiatric/Behavioral: Negative for dysphoric mood. The patient is not nervous/anxious.          Objective:   Physical Exam  Constitutional: She appears well-nourished. No distress.       Frail appearing elderly female in no distress  HENT:  Head: Normocephalic and atraumatic.  Mouth/Throat: Oropharynx is clear and moist.  Eyes: Conjunctivae and EOM are normal. Pupils are equal, round, and reactive to light.  Neck: Normal range of motion. Neck supple. No JVD present. Carotid bruit is not present. Erythema present. No thyromegaly present.  Cardiovascular: Normal rate, regular rhythm, normal heart sounds and intact distal pulses.   Pulmonary/Chest: Breath sounds normal. No respiratory distress. She has no wheezes.  Abdominal: Soft. Bowel sounds are normal. She exhibits no distension, no abdominal bruit and no mass. There is no tenderness.  Musculoskeletal: Normal range of motion. She exhibits no edema.       OA notable  Lymphadenopathy:    She has no cervical adenopathy.  Neurological: She is alert. She has normal reflexes. She displays normal reflexes. No cranial nerve deficit.  Skin: Skin is warm and dry. No rash noted. No erythema. No pallor.  Psychiatric: She has a normal mood and affect.       Cheerful and pleasant          Assessment & Plan:

## 2011-04-14 ENCOUNTER — Other Ambulatory Visit: Payer: Self-pay | Admitting: *Deleted

## 2011-04-14 MED ORDER — FUROSEMIDE 20 MG PO TABS: 40.0000 mg | ORAL_TABLET | ORAL | Status: DC

## 2011-05-12 ENCOUNTER — Other Ambulatory Visit: Payer: Self-pay | Admitting: Family Medicine

## 2011-05-13 ENCOUNTER — Encounter: Payer: Self-pay | Admitting: Family Medicine

## 2011-05-13 ENCOUNTER — Ambulatory Visit (INDEPENDENT_AMBULATORY_CARE_PROVIDER_SITE_OTHER): Payer: Medicare Other | Admitting: Family Medicine

## 2011-05-13 VITALS — BP 124/70 | HR 72 | Temp 98.4°F | Wt 158.2 lb

## 2011-05-13 DIAGNOSIS — H60399 Other infective otitis externa, unspecified ear: Secondary | ICD-10-CM

## 2011-05-13 DIAGNOSIS — H6090 Unspecified otitis externa, unspecified ear: Secondary | ICD-10-CM

## 2011-05-13 MED ORDER — CEPHALEXIN 500 MG PO CAPS: 500.0000 mg | ORAL_CAPSULE | Freq: Four times a day (QID) | ORAL | Status: AC

## 2011-05-13 MED ORDER — HYDROCORTISONE-ACETIC ACID 1-2 % OT SOLN: 4.0000 [drp] | Freq: Two times a day (BID) | OTIC | Status: AC

## 2011-05-13 NOTE — Patient Instructions (Signed)
I think external ear infection that has spread into surrounding skin Treat with ear drops (steroids to decrease swelling) as well as oral antibiotic 4 times daily for 7 days. Update [place] if not improving as expected, worsening pain or spreading redness, or fever >101.5  Swimmer's Ear (Otitis Externa) Otitis externa ("swimmer's ear") is a germ (bacterial) or fungal infection of the outer ear canal (from the eardrum to the outside of the ear). Swimming in dirty water may cause swimmer's ear. It also may be caused by moisture in the ear from water remaining after swimming or bathing. Often the first signs of infection may be itching in the ear canal. This may progress to ear canal swelling, redness, and pus drainage which may be signs of infection. HOME CARE INSTRUCTIONS  Apply the antibiotic drops to the ear canal as prescribed by your doctor.   This can be a very painful medical condition. A strong pain reliever may be prescribed.   Only take over-the-counter or prescription medicines for pain, discomfort, or fever as directed by your caregiver.   If your caregiver has given you a follow-up appointment, it is very important to keep that appointment. Not keeping the appointment could result in a chronic or permanent injury, pain, hearing loss and disability. If there is any problem keeping the appointment, you must call back to this facility for assistance.  PREVENTION  It is important to keep your ear dry. Use the corner of a towel to wick water out of the ear canal after swimming or bathing.   Avoid scratching in your ear. This can damage the ear canal or remove the protective wax lining the canal and make it easier for germs (bacteria) or a fungus to grow.   You may use ear drops made of rubbing alcohol and vinegar after swimming to prevent future "swimmer ear" infections. Make up a small bottle of equal parts white vinegar and alcohol. Put 3 or 4 drops into each ear after swimming.   Avoid  swimming in lakes, polluted water, or poorly chlorinated pools.  SEEK MEDICAL CARE IF:  An oral temperature above 101 develops.   Your ear is still painful after 3 days and shows signs of getting worse (redness, swelling, pain, or pus).  MAKE SURE YOU:   Understand these instructions.   Will watch your condition.   Will get help right away if you are not doing well or get worse.  Document Released: 09/01/2005 Document Re-Released: 08/14/2008 [facility] Patient Information 2011 [place], [place].

## 2011-05-13 NOTE — Telephone Encounter (Signed)
Will refill electronically  

## 2011-05-13 NOTE — Assessment & Plan Note (Signed)
External otitis with periauricular cellulitis. Treat with vosol HC for ear canal swelling, and keflex qid x 7 days for cellulitis. Discussed red flags to notify us - ie worsening pain, fever, or if not improving as expected for concern of osteo.

## 2011-05-13 NOTE — Telephone Encounter (Signed)
[facility] electronically request refill on Bentyl 10mg .Please advise.

## 2011-05-13 NOTE — Progress Notes (Signed)
  Subjective:    Patient ID: [name], female    DOB: [birth year], 77 y.o.   MRN: [id number]  HPI CC: R ear pain  1d h/o R ear pain, swelling.  Redness spreading.  + decreased hearing and chills Sunday afternoon.  No draining, fevers, recent swimming.  Did have bad sinus headache Saturday.  Sunday had more drainage than normal.  No changes in soaps, detergents.  Review of Systems Per HPI    Objective:   Physical Exam  Nursing note and vitals reviewed. Constitutional: She appears well-developed and well-nourished. No distress.  HENT:  Head: Normocephalic and atraumatic.  Left Ear: Hearing, tympanic membrane, external ear and ear canal normal.  Ears:  Nose: Nose normal. No mucosal edema or rhinorrhea. Right sinus exhibits no maxillary sinus tenderness and no frontal sinus tenderness. Left sinus exhibits no maxillary sinus tenderness and no frontal sinus tenderness.  Mouth/Throat: Uvula is midline, oropharynx is clear and moist and mucous membranes are normal. No oropharyngeal exudate, posterior oropharyngeal edema, posterior oropharyngeal erythema or tonsillar abscesses.       Right external ear/pinna swollen, erythematous, with erythema extending down neck about 1 inch below pinna.  External canal swollen, TM seems intact.  No mastoid tenderness.  Eyes: Conjunctivae and EOM are normal. Pupils are equal, round, and reactive to light. No scleral icterus.       Slight erythema around orbits and slight eye discharge discharge bilaterally  Neck: Normal range of motion. Neck supple.  Lymphadenopathy:       Head (right side): No preauricular, no posterior auricular and no occipital adenopathy present.       Head (left side): No preauricular, no posterior auricular and no occipital adenopathy present.    She has no cervical adenopathy.       Assessment & Plan:

## 2011-05-15 ENCOUNTER — Ambulatory Visit (INDEPENDENT_AMBULATORY_CARE_PROVIDER_SITE_OTHER): Payer: Medicare Other | Admitting: *Deleted

## 2011-05-15 ENCOUNTER — Other Ambulatory Visit: Payer: Self-pay | Admitting: Internal Medicine

## 2011-05-15 DIAGNOSIS — I428 Other cardiomyopathies: Secondary | ICD-10-CM

## 2011-05-16 LAB — REMOTE ICD DEVICE
AL IMPEDENCE ICD: 394 Ohm
LV LEAD IMPEDENCE ICD: 683 Ohm
MODE SWITCH EPISODES: 0
RV LEAD IMPEDENCE ICD: 960 Ohm
TOT-0006: 20120531000000
TZAT-0001FASTVT: 2
TZAT-0018FASTVT: NEGATIVE
TZST-0001FASTVT: 5
TZST-0001FASTVT: 7
TZST-0003FASTVT: 23 J
TZST-0003FASTVT: 31 J
VENTRICULAR PACING ICD: 100 pct

## 2011-05-28 ENCOUNTER — Telehealth: Payer: Self-pay | Admitting: Family Medicine

## 2011-05-30 NOTE — Progress Notes (Signed)
ICD checked by remote. 

## 2011-06-06 ENCOUNTER — Ambulatory Visit (INDEPENDENT_AMBULATORY_CARE_PROVIDER_SITE_OTHER): Payer: Medicare Other | Admitting: Family Medicine

## 2011-06-06 ENCOUNTER — Encounter: Payer: Self-pay | Admitting: Family Medicine

## 2011-06-06 DIAGNOSIS — H60399 Other infective otitis externa, unspecified ear: Secondary | ICD-10-CM

## 2011-06-06 DIAGNOSIS — H6090 Unspecified otitis externa, unspecified ear: Secondary | ICD-10-CM

## 2011-06-06 DIAGNOSIS — Z23 Encounter for immunization: Secondary | ICD-10-CM

## 2011-06-06 NOTE — Assessment & Plan Note (Signed)
Already got pneumovax at 35 - so up to date  Flu shot today Then plan zostavax in 1 mo - will make nurse appt on way out Disc handwashing to prev infx this flu season and staying out of public places

## 2011-06-06 NOTE — Assessment & Plan Note (Signed)
Otitis is totally resolved Doing well  Disc cutting nails short so she no longer scratches in her ear and puts herself at risk for infx

## 2011-06-06 NOTE — Progress Notes (Signed)
Subjective:    Patient ID: [name], female    DOB: 1924/07/03, 77 y.o.   MRN: [id number]  HPI Here to discuss imms   Had a bad ear infx at last visit -and that cleared up after seeing Dr Lane Hacker abx and used drops  No more pain No more redness or swelling of the ear Hearing is ok  Pneumovax is up to date in 2000  Needs flu shot   Checked ins about shingles vaccine  They will cover   Patient Active Problem List  Diagnoses  . HYPERTHYROIDISM  . DIABETES MELLITUS, TYPE II  . HYPERLIPIDEMIA  . GOUT  . HYPERTENSION  . CORONARY ARTERY DISEASE  . CARDIOMYOPATHY, SECONDARY  . AV BLOCK, COMPLETE  . SYSTOLIC HEART FAILURE, CHRONIC  . ANEURYSM, SPLENIC ARTERY  . ALLERGIC RHINITIS  . IRRITABLE BOWEL SYNDROME  . RENAL FAILURE  . UTI'S, CHRONIC  . OSTEOARTHRITIS  . OSTEOPENIA  . COLONIC POLYPS, ADENOMATOUS, HX OF  . AUTOMATIC IMPLANTABLE CARDIAC DEFIBRILLATOR SITU  . GERD (gastroesophageal reflux disease)  . External otitis  . Immunization due   Past Medical History  Diagnosis Date  . Diabetes mellitus     Type II  . Hypertension   . Gout   . Allergy     allergic rhinitis  . CAD (coronary artery disease)     pulmonary hypertension// Cardiac cath 2001//2004 carotid dopplers Normal  . CHF (congestive heart failure)     non ish. cardiomyopathy// Dr. Tenny Craw cardiologist  . Cancer 2002    Renal cell cancer partial nephrectomy  . GERD (gastroesophageal reflux disease) 2003    EGD polyp; gastritis HH//Dr. Leone Payor GI  . Diverticula of intestine 2003    colonoscopy polyp; hem; divertic  . Cataract 2008    Right eye  . Personal history of kidney stones   . Hyperlipidemia     TRIG  . Osteopenia 2002    Dexa  . IBS (irritable bowel syndrome)   . Choledocholithiasis   . Transfusion history 1982    PUD/Bleed/Transfusion  . Osteoarthritis 07/12/2007/ 10/2004    spine and knees// LS Degen. changes   . Chronic kidney disease 05/2006    Renal failure ; hospitalized   Past  Surgical History  Procedure Date  . Eye surgery 2008    Right eye cataract  . Cystoscopy w/ stone manipulation 2010- 2011    ERCPS and clearing of choledocholithiasis  . Ercp 2010  . Colonoscopy 09/2006    polyps  . Abdominal hysterectomy 1999    hysterectomy with bladder tack   History  Substance Use Topics  . Smoking status: Never Smoker   . Smokeless tobacco: Not on file  . Alcohol Use: No   Family History  Problem Relation Age of Onset  . Cancer Daughter     breast cancer  . Cancer Son 68    Esophageal Cancer  . Heart disease Daughter    Allergies  Allergen Reactions  . Aspirin     REACTION: stomach upset  . Atorvastatin     REACTION: muscle pain  . Celecoxib     REACTION: stomach upset  . Fenofibrate     REACTION: muscle pain  . Metformin     REACTION: diarrhea  . Moxifloxacin     REACTION: stomach upset  . Niacin     REACTION: muscle pain   Current Outpatient Prescriptions on File Prior to Visit  Medication Sig Dispense Refill  . amLODipine (NORVASC) 5 MG  tablet TAKE 1/2 TABLET BY MOUTH TWICE A DAY  30 tablet  5  . BD ULTRA-FINE LANCETS lancets Use to check glucose once daily and in the evening       . carvedilol (COREG) 12.5 MG tablet 1 and 1/2 tablet by mouth two times daily       . Cholecalciferol (VITAMIN D3) 1000 UNITS CAPS Take 1,000 Units by mouth daily.        Marland Kitchen dicyclomine (BENTYL) 10 MG capsule take 1 to 2 capsules by mouth before meals  90 capsule  3  . esomeprazole (NEXIUM) 40 MG capsule Take 1 capsule (40 mg total) by mouth daily.  30 capsule  11  . furosemide (LASIX) 20 MG tablet Take 2 tablets (40 mg total) by mouth every other day.  30 tablet  11  . gabapentin (NEURONTIN) 300 MG capsule Take 2 capsules (600 mg total) by mouth 2 (two) times daily.  120 capsule  11  . glucose blood test strip Test two times a day as directed       . insulin lispro protamine-insulin lispro (HUMALOG MIX 75/25 PEN) (75-25) 100 UNIT/ML SUSP Inject 8 Units into the  skin 2 (two) times daily with a meal.        . loperamide (LOPERAMIDE A-D) 2 MG tablet As needed       . ramipril (ALTACE) 10 MG tablet Take 10 mg by mouth daily.        . simvastatin (ZOCOR) 20 MG tablet Take 20 mg by mouth at bedtime.        Marland Kitchen acetaminophen (TYLENOL) 325 MG tablet OTC as directed              Review of Systems Review of Systems  Constitutional: Negative for fever, appetite change, fatigue and unexpected weight change.  Eyes: Negative for pain and visual disturbance.  Respiratory: Negative for cough and shortness of breath.   Cardiovascular: Negative for cp or palpitations    Gastrointestinal: Negative for nausea, diarrhea and constipation.  Genitourinary: Negative for urgency and frequency.  Skin: Negative for pallor or rash   Neurological: Negative for weakness, light-headedness, numbness and headaches.  Hematological: Negative for adenopathy. Does not bruise/bleed easily.  Psychiatric/Behavioral: Negative for dysphoric mood. The patient is not nervous/anxious.          Objective:   Physical Exam  Constitutional: She appears well-developed and well-nourished.  HENT:  Head: Normocephalic and atraumatic.  Right Ear: External ear normal.  Left Ear: External ear normal.  Nose: Nose normal.  Mouth/Throat: Oropharynx is clear and moist.  Neck: Normal range of motion. Neck supple. No thyromegaly present.  Cardiovascular: Normal rate, regular rhythm and normal heart sounds.   Pulmonary/Chest: No respiratory distress. She has no wheezes.  Lymphadenopathy:    She has no cervical adenopathy.  Neurological: No cranial nerve deficit.  Skin: Skin is warm and dry. No rash noted. No erythema. No pallor.  Psychiatric: She has a normal mood and affect.          Assessment & Plan:

## 2011-06-06 NOTE — Patient Instructions (Signed)
Flu shot today  Make nurse appt on the way out for a shingles vaccine in 1 month Take care of yourself Ear looks better

## 2011-06-12 ENCOUNTER — Encounter: Payer: Self-pay | Admitting: *Deleted

## 2011-06-19 ENCOUNTER — Telehealth: Payer: Self-pay | Admitting: Internal Medicine

## 2011-06-19 NOTE — Telephone Encounter (Signed)
Daughter called regarding her blood pressure.  She has an appt but wants to talk to someone over her low blood pressure.  Feels weak the lowest bp 90/60 pulse 63 today. Please call her back.

## 2011-06-19 NOTE — Telephone Encounter (Signed)
Patient's daughter called regarding patient C/O of a headache starting on Tuesday this week. Today pt C/O of  A headache, Dizziness and  feeling sick. B/P was 90/60 pulse 63 beats/minute at 12:55 PM at 1:35 B/P 92/61 by EMS. At 2:30 pm B/P 113/58, pulse 70 beats/ minute. Patient feels better  and  Now she is sleeping. Patient has an appointment with Dr. Tenny Craw tomorrow 06/20/11 at 09;30 pm.   Dr. [place] DOD recommends for pt. To hold Norvasc dose tonight. Patient's daughter aware, she verbalized understanding.

## 2011-06-20 ENCOUNTER — Encounter: Payer: Self-pay | Admitting: Internal Medicine

## 2011-06-20 ENCOUNTER — Other Ambulatory Visit: Payer: Self-pay | Admitting: *Deleted

## 2011-06-20 ENCOUNTER — Ambulatory Visit (INDEPENDENT_AMBULATORY_CARE_PROVIDER_SITE_OTHER): Payer: Medicare Other | Admitting: Internal Medicine

## 2011-06-20 DIAGNOSIS — I2510 Atherosclerotic heart disease of native coronary artery without angina pectoris: Secondary | ICD-10-CM

## 2011-06-20 DIAGNOSIS — I10 Essential (primary) hypertension: Secondary | ICD-10-CM

## 2011-06-20 DIAGNOSIS — R29818 Other symptoms and signs involving the nervous system: Secondary | ICD-10-CM

## 2011-06-20 DIAGNOSIS — R2990 Unspecified symptoms and signs involving the nervous system: Secondary | ICD-10-CM | POA: Insufficient documentation

## 2011-06-20 DIAGNOSIS — E785 Hyperlipidemia, unspecified: Secondary | ICD-10-CM

## 2011-06-20 DIAGNOSIS — I5022 Chronic systolic (congestive) heart failure: Secondary | ICD-10-CM

## 2011-06-20 DIAGNOSIS — I6350 Cerebral infarction due to unspecified occlusion or stenosis of unspecified cerebral artery: Secondary | ICD-10-CM

## 2011-06-20 DIAGNOSIS — N189 Chronic kidney disease, unspecified: Secondary | ICD-10-CM

## 2011-06-20 DIAGNOSIS — I639 Cerebral infarction, unspecified: Secondary | ICD-10-CM

## 2011-06-20 LAB — BASIC METABOLIC PANEL
GFR: 31.39 mL/min — ABNORMAL LOW (ref >60.00)
Glucose, Bld: 148 mg/dL — ABNORMAL HIGH (ref 70–99)
Potassium: 3.8 mEq/L (ref 3.5–5.1)
Sodium: 139 mEq/L (ref 135–145)

## 2011-06-20 NOTE — Progress Notes (Signed)
HPI Patient is a 77 year old with a history of NICM (now normal function), s/p ICD, dyslipidemia and hypertension I saw her in March The patients daughter keeps extensive records.  SHe said that this week the patient has had a couple of bad days. On Tuesday she had a bad HA (frontal), She was dizzy and weak  Didn't eat much.  Could grasp fork  She had to be fed. Also noted that patients tongue did not move normally  Speech was different. By Wed headache was much improved but not gon.  Still weak  BP was OK Yesterday BP was low on mutiple checks.  NO CP  Still sl dizzy. Today fealing better. No chest pains.  Breathing is OK.  Allergies  Allergen Reactions  . Aspirin     REACTION: stomach upset  . Atorvastatin     REACTION: muscle pain  . Celecoxib     REACTION: stomach upset  . Fenofibrate     REACTION: muscle pain  . Metformin     REACTION: diarrhea  . Moxifloxacin     REACTION: stomach upset  . Niacin     REACTION: muscle pain    Current Outpatient Prescriptions  Medication Sig Dispense Refill  . acetaminophen (TYLENOL) 325 MG tablet OTC as directed       . amLODipine (NORVASC) 5 MG tablet TAKE 1/2 TABLET BY MOUTH TWICE A DAY  30 tablet  5  . BD ULTRA-FINE LANCETS lancets Use to check glucose once daily and in the evening       . carvedilol (COREG) 12.5 MG tablet 1 and 1/2 tablet by mouth two times daily       . Cholecalciferol (VITAMIN D3) 1000 UNITS CAPS Take 1,000 Units by mouth daily.        Marland Kitchen dicyclomine (BENTYL) 10 MG capsule take 1 to 2 capsules by mouth before meals  90 capsule  3  . esomeprazole (NEXIUM) 40 MG capsule Take 1 capsule (40 mg total) by mouth daily.  30 capsule  11  . furosemide (LASIX) 20 MG tablet Take 2 tablets (40 mg total) by mouth every other day.  30 tablet  11  . gabapentin (NEURONTIN) 300 MG capsule Take 2 capsules (600 mg total) by mouth 2 (two) times daily.  120 capsule  11  . glucose blood test strip Test two times a day as directed       .  insulin lispro protamine-insulin lispro (HUMALOG MIX 75/25 PEN) (75-25) 100 UNIT/ML SUSP Inject 8 Units into the skin 2 (two) times daily with a meal.        . loperamide (LOPERAMIDE A-D) 2 MG tablet As needed       . ramipril (ALTACE) 10 MG tablet Take 10 mg by mouth daily.        . simvastatin (ZOCOR) 20 MG tablet Take 20 mg by mouth at bedtime.          Past Medical History  Diagnosis Date  . Diabetes mellitus     Type II  . Hypertension   . Gout   . Allergy     allergic rhinitis  . CAD (coronary artery disease)     pulmonary hypertension// Cardiac cath 2001//2004 carotid dopplers Normal  . CHF (congestive heart failure)     non ish. cardiomyopathy// Dr. Tenny Craw cardiologist  . Cancer 2002    Renal cell cancer partial nephrectomy  . GERD (gastroesophageal reflux disease) 2003    EGD polyp; gastritis HH//Dr. Leone Payor  GI  . Diverticula of intestine 2003    colonoscopy polyp; hem; divertic  . Cataract 2008    Right eye  . Personal history of kidney stones   . Hyperlipidemia     TRIG  . Osteopenia 2002    Dexa  . IBS (irritable bowel syndrome)   . Choledocholithiasis   . Transfusion history 1982    PUD/Bleed/Transfusion  . Osteoarthritis 07/12/2007/ 10/2004    spine and knees// LS Degen. changes   . Chronic kidney disease 05/2006    Renal failure ; hospitalized    Past Surgical History  Procedure Date  . Eye surgery 2008    Right eye cataract  . Cystoscopy w/ stone manipulation 2010- 2011    ERCPS and clearing of choledocholithiasis  . Ercp 2010  . Colonoscopy 09/2006    polyps  . Abdominal hysterectomy 1999    hysterectomy with bladder tack    Family History  Problem Relation Age of Onset  . Cancer Daughter     breast cancer  . Cancer Son 52    Esophageal Cancer  . Heart disease Daughter     History   Social History  . Marital Status: Widowed    Spouse Name: N/A    Number of Children: N/A  . Years of Education: N/A   Occupational History  . Not on  file.   Social History Main Topics  . Smoking status: Never Smoker   . Smokeless tobacco: Not on file  . Alcohol Use: No  . Drug Use: No  . Sexually Active:    Other Topics Concern  . Not on file   Social History Narrative  . No narrative on file    Review of Systems:  All systems reviewed.  They are negative to the above problem except as previously stated.  Vital Signs: BP 122/59  Pulse 70  Ht 5\' 2"  (1.575 m)  Wt 156 lb 6.4 oz (70.943 kg)  BMI 28.61 kg/m2  Physical Exam Patient is in NAD  HEENT:  Normocephalic, atraumatic. EOMI, PERRLA.  Neck: JVP is normal. No thyromegaly. No bruits.  Lungs: clear to auscultation. No rales no wheezes.  Heart: Regular rate and rhythm. Normal S1, S2. No S3.   No significant murmurs. PMI not displaced.  Abdomen:  Supple, nontender. Normal bowel sounds. No masses. No hepatomegaly.  Extremities:   Good distal pulses throughout. No lower extremity edema.  Musculoskeletal :moving all extremities.  Neuro:   alert and oriented x3.  CN II-XII grossly intact.  EKG:  AV sequential pacer.  Assessment and Plan:

## 2011-06-20 NOTE — Patient Instructions (Signed)
Ct OF HEAD WITH CONTRAST. Your physician recommends that you return for lab work in: today BMET. Your physician recommends that you schedule a follow-up appointment in: 6 months, the office will mail you a reminder letter 2 months prior appointment date. Your physician recommends that you continue on your current medications as directed. Please refer to the Current Medication list given to you today.

## 2011-06-20 NOTE — Assessment & Plan Note (Signed)
LV function has normalized.  Keep on same meds.

## 2011-06-20 NOTE — Assessment & Plan Note (Signed)
Keep on meds.

## 2011-06-20 NOTE — Assessment & Plan Note (Signed)
BP is good today.  I cannot explain the one day she had low bp. Follow.

## 2011-06-20 NOTE — Assessment & Plan Note (Signed)
I am not sure what the symtoms on Tuesday were from.  ?TIA.  No gone I would continue meds.  Get CT. Patient has not toleated ASA in the past due to stomach upset.

## 2011-06-24 ENCOUNTER — Ambulatory Visit (INDEPENDENT_AMBULATORY_CARE_PROVIDER_SITE_OTHER)
Admission: RE | Admit: 2011-06-24 | Discharge: 2011-06-24 | Disposition: A | Discharge status: Home / Self Care | Payer: Medicare Other | Source: Ambulatory Visit | Attending: Internal Medicine | Admitting: Internal Medicine

## 2011-06-24 DIAGNOSIS — I6350 Cerebral infarction due to unspecified occlusion or stenosis of unspecified cerebral artery: Secondary | ICD-10-CM

## 2011-06-24 DIAGNOSIS — I639 Cerebral infarction, unspecified: Secondary | ICD-10-CM

## 2011-06-26 ENCOUNTER — Ambulatory Visit (INDEPENDENT_AMBULATORY_CARE_PROVIDER_SITE_OTHER): Payer: Medicare Other | Admitting: *Deleted

## 2011-06-26 ENCOUNTER — Encounter: Payer: Self-pay | Admitting: Internal Medicine

## 2011-06-26 ENCOUNTER — Encounter: Payer: Self-pay | Admitting: *Deleted

## 2011-06-26 DIAGNOSIS — Z95810 Presence of automatic (implantable) cardiac defibrillator: Secondary | ICD-10-CM

## 2011-06-26 DIAGNOSIS — I442 Atrioventricular block, complete: Secondary | ICD-10-CM

## 2011-06-26 DIAGNOSIS — I428 Other cardiomyopathies: Secondary | ICD-10-CM

## 2011-06-27 ENCOUNTER — Other Ambulatory Visit (INDEPENDENT_AMBULATORY_CARE_PROVIDER_SITE_OTHER): Payer: Medicare Other | Admitting: *Deleted

## 2011-06-27 DIAGNOSIS — N189 Chronic kidney disease, unspecified: Secondary | ICD-10-CM

## 2011-06-27 LAB — BASIC METABOLIC PANEL
BUN: 17 mg/dL (ref 6–23)
CO2: 31 mEq/L (ref 19–32)
Calcium: 9.1 mg/dL (ref 8.4–10.5)
Creatinine, Ser: 0.8 mg/dL (ref 0.4–1.2)
Glucose, Bld: 180 mg/dL — ABNORMAL HIGH (ref 70–99)

## 2011-06-28 LAB — REMOTE ICD DEVICE
AL IMPEDENCE ICD: 394 Ohm
ATRIAL PACING ICD: 94 pct
CHARGE TIME: 10.2 s
MODE SWITCH EPISODES: 0
PACEART VT: 0
TOT-0006: 20120830000000
TZAT-0013FASTVT: 2
TZAT-0018FASTVT: NEGATIVE
TZST-0001FASTVT: 3
TZST-0001FASTVT: 4
TZST-0003FASTVT: 17 J
TZST-0003FASTVT: 31 J
TZST-0003FASTVT: 31 J

## 2011-06-30 ENCOUNTER — Encounter: Payer: Self-pay | Admitting: *Deleted

## 2011-06-30 ENCOUNTER — Encounter: Payer: Self-pay | Admitting: Internal Medicine

## 2011-06-30 ENCOUNTER — Ambulatory Visit (INDEPENDENT_AMBULATORY_CARE_PROVIDER_SITE_OTHER): Payer: Medicare Other | Admitting: Internal Medicine

## 2011-06-30 DIAGNOSIS — Z95810 Presence of automatic (implantable) cardiac defibrillator: Secondary | ICD-10-CM

## 2011-06-30 DIAGNOSIS — I442 Atrioventricular block, complete: Secondary | ICD-10-CM

## 2011-06-30 DIAGNOSIS — I429 Cardiomyopathy, unspecified: Secondary | ICD-10-CM

## 2011-06-30 DIAGNOSIS — I509 Heart failure, unspecified: Secondary | ICD-10-CM

## 2011-06-30 DIAGNOSIS — I10 Essential (primary) hypertension: Secondary | ICD-10-CM

## 2011-06-30 NOTE — Patient Instructions (Signed)
Pre procedure labs today.

## 2011-06-30 NOTE — Progress Notes (Signed)
icd remote check  

## 2011-06-30 NOTE — Progress Notes (Signed)
HPI [name] is seen in followup of NICM (now normal functionby echo September 2011), s/p CRT-D now at [facility].  She has recently had problems with dizziness. There was concern about an intercurrent stroke. Interrogation of her device demonstrated no atrial fibrillation.  She is quite demented.     Allergies  Allergen Reactions  . Aspirin     REACTION: stomach upset  . Atorvastatin     REACTION: muscle pain  . Celecoxib     REACTION: stomach upset  . Fenofibrate     REACTION: muscle pain  . Metformin     REACTION: diarrhea  . Moxifloxacin     REACTION: stomach upset  . Niacin     REACTION: muscle pain    Current Outpatient Prescriptions  Medication Sig Dispense Refill  . acetaminophen (TYLENOL) 325 MG tablet OTC as directed       . amLODipine (NORVASC) 5 MG tablet TAKE 1/2 TABLET BY MOUTH TWICE A DAY  30 tablet  5  . BD ULTRA-FINE LANCETS lancets Use to check glucose once daily and in the evening       . carvedilol (COREG) 12.5 MG tablet 1 and 1/2 tablet by mouth two times daily       . Cholecalciferol (VITAMIN D3) 1000 UNITS CAPS Take 1,000 Units by mouth daily.        Marland Kitchen dicyclomine (BENTYL) 10 MG capsule take 1 to 2 capsules by mouth before meals  90 capsule  3  . esomeprazole (NEXIUM) 40 MG capsule Take 1 capsule (40 mg total) by mouth daily.  30 capsule  11  . gabapentin (NEURONTIN) 300 MG capsule Take 2 capsules (600 mg total) by mouth 2 (two) times daily.  120 capsule  11  . glucose blood test strip Test two times a day as directed       . insulin lispro protamine-insulin lispro (HUMALOG MIX 75/25 PEN) (75-25) 100 UNIT/ML SUSP Inject 8 Units into the skin 2 (two) times daily with a meal.        . loperamide (LOPERAMIDE A-D) 2 MG tablet As needed       . simvastatin (ZOCOR) 20 MG tablet Take 20 mg by mouth at bedtime.        . furosemide (LASIX) 20 MG tablet Take 2 tablets (40 mg total) by mouth every other day.  30 tablet  11  . ramipril (ALTACE) 10 MG tablet Take 10 mg by  mouth daily.          Past Medical History  Diagnosis Date  . Diabetes mellitus     Type II  . Hypertension   . Gout   . Allergy     allergic rhinitis  . CAD (coronary artery disease)     pulmonary hypertension// Cardiac cath 2001//2004 carotid dopplers Normal  . CHF (congestive heart failure)     non ish. cardiomyopathy// Dr. Tenny Craw cardiologist  . Cancer 2002    Renal cell cancer partial nephrectomy  . GERD (gastroesophageal reflux disease) 2003    EGD polyp; gastritis HH//Dr. Leone Payor GI  . Diverticula of intestine 2003    colonoscopy polyp; hem; divertic  . Cataract 2008    Right eye  . Personal history of kidney stones   . Hyperlipidemia     TRIG  . Osteopenia 2002    Dexa  . IBS (irritable bowel syndrome)   . Choledocholithiasis   . Transfusion history 1982    PUD/Bleed/Transfusion  . Osteoarthritis 07/12/2007/ 10/2004  spine and knees// LS Degen. changes   . Chronic kidney disease 05/2006    Renal failure ; hospitalized    Past Surgical History  Procedure Date  . Eye surgery 2008    Right eye cataract  . Cystoscopy w/ stone manipulation 2010- 2011    ERCPS and clearing of choledocholithiasis  . Ercp 2010  . Colonoscopy 09/2006    polyps  . Abdominal hysterectomy 1999    hysterectomy with bladder tack    Family History  Problem Relation Age of Onset  . Cancer Daughter     breast cancer  . Cancer Son 72    Esophageal Cancer  . Heart disease Daughter     History   Social History  . Marital Status: Widowed    Spouse Name: N/A    Number of Children: N/A  . Years of Education: N/A   Occupational History  . Not on file.   Social History Main Topics  . Smoking status: Never Smoker   . Smokeless tobacco: Not on file  . Alcohol Use: No  . Drug Use: No  . Sexually Active:    Other Topics Concern  . Not on file   Social History Narrative  . No narrative on file    Review of Systems:  All systems reviewed.  They are negative to the above  problem except as previously stated.  Vital Signs: BP 191/78  Pulse 75  Ht 5\' 3"  (1.6 m)  Wt 158 lb 3.2 oz (71.759 kg)  BMI 28.02 kg/m2  Physical Exam Patient is in NAD  HEENT:  Normocephalic, atraumatic. EOMI, PERRLA.  Neck: JVP is normal. No thyromegaly. No bruits.  Lungs: clear to auscultation. No rales no wheezes.  Heart: Regular rate and rhythm. Normal S1, S2. No S3.   No significant murmurs. PMI not displaced.  Abdomen:  Supple, nontender. Normal bowel sounds. No masses. No hepatomegaly.  Extremities:   Good distal pulses throughout. No lower extremity edema.  Musculoskeletal :moving all extremities.  Neuro:   alert and oriented x3.  CN II-XII grossly intact.  EKG:  AV sequential pacer.  Assessment and Plan:

## 2011-06-30 NOTE — Assessment & Plan Note (Signed)
The device has reached ERI. We have discussed risks and benefits of device generator replacement as described above.

## 2011-06-30 NOTE — Assessment & Plan Note (Signed)
She has a healed cardiomyopathy. Hence there is no clear indication to replace an ICD.

## 2011-06-30 NOTE — Assessment & Plan Note (Signed)
The patient is device dependent. As the device generator will need to be replaced. We will go from CRT-D to CRT P. I have reviewed with the family the potential risks and benefits including but not limited the fracture and infection I understand these risks and are willing to proceed.

## 2011-06-30 NOTE — Assessment & Plan Note (Signed)
Blood pressure is markedly elevated. We will resume her ACE inhibitor. A couple of weeks ago she had transient blood pressures in the 90s for reasons that are not clear so we will have to keep a close followup of her blood pressure.

## 2011-07-01 LAB — PROTIME-INR
INR: 1 ratio (ref 0.8–1.0)
Prothrombin Time: 11.2 s (ref 10.2–12.4)

## 2011-07-01 LAB — BASIC METABOLIC PANEL
Calcium: 9.4 mg/dL (ref 8.4–10.5)
GFR: 57.27 mL/min — ABNORMAL LOW (ref >60.00)
Potassium: 4.5 mEq/L (ref 3.5–5.1)
Sodium: 141 mEq/L (ref 135–145)

## 2011-07-01 LAB — CBC WITH DIFFERENTIAL/PLATELET
Basophils Absolute: 0.1 10*3/uL (ref 0.0–0.1)
Eosinophils Relative: 2 % (ref 0.0–5.0)
HCT: 36 % (ref 36.0–46.0)
Lymphocytes Relative: 21.2 % (ref 12.0–46.0)
Lymphs Abs: 1.9 10*3/uL (ref 0.7–4.0)
Monocytes Relative: 3.9 % (ref 3.0–12.0)
Neutrophils Relative %: 71.9 % (ref 43.0–77.0)
Platelets: 276 10*3/uL (ref 150.0–400.0)
RDW: 14.5 % (ref 11.5–14.6)
WBC: 8.8 10*3/uL (ref 4.5–10.5)

## 2011-07-01 LAB — APTT: aPTT: 28.8 s (ref 21.7–28.8)

## 2011-07-06 ENCOUNTER — Other Ambulatory Visit: Payer: Self-pay | Admitting: Internal Medicine

## 2011-07-07 ENCOUNTER — Encounter: Payer: Self-pay | Admitting: Internal Medicine

## 2011-07-07 ENCOUNTER — Ambulatory Visit (HOSPITAL_COMMUNITY)
Admission: RE | Admit: 2011-07-07 | Discharge: 2011-07-07 | Disposition: A | Discharge status: Home / Self Care | Payer: Medicare Other | Source: Ambulatory Visit | Attending: Internal Medicine | Admitting: Internal Medicine

## 2011-07-07 ENCOUNTER — Ambulatory Visit (HOSPITAL_COMMUNITY): Payer: Medicare Other

## 2011-07-07 DIAGNOSIS — Z4502 Encounter for adjustment and management of automatic implantable cardiac defibrillator: Secondary | ICD-10-CM | POA: Insufficient documentation

## 2011-07-07 DIAGNOSIS — I428 Other cardiomyopathies: Secondary | ICD-10-CM | POA: Insufficient documentation

## 2011-07-07 DIAGNOSIS — I442 Atrioventricular block, complete: Secondary | ICD-10-CM | POA: Insufficient documentation

## 2011-07-07 DIAGNOSIS — Z95810 Presence of automatic (implantable) cardiac defibrillator: Secondary | ICD-10-CM

## 2011-07-07 LAB — GLUCOSE, CAPILLARY: Glucose-Capillary: 161 mg/dL — ABNORMAL HIGH (ref 70–99)

## 2011-07-08 ENCOUNTER — Ambulatory Visit: Payer: Medicare Other

## 2011-07-10 ENCOUNTER — Encounter: Payer: Self-pay | Admitting: *Deleted

## 2011-07-11 NOTE — Op Note (Signed)
  [name], [name] NO.:  [id number]  MEDICAL RECORD NO.:  [id number]  LOCATION:  [facility]                         FACILITY:  [facility]  PHYSICIAN:  Duke Salvia, MD, FACCDATE OF BIRTH:  1924/05/10  DATE OF PROCEDURE:  07/07/2011 DATE OF DISCHARGE:  07/07/2011                              OPERATIVE REPORT   PREOPERATIVE DIAGNOSES:  Previously implanted implantable cardioverter- defibrillator for nonischemic cardiomyopathy with complete heart block, now at ERI with intercurrent resolution of left ventricular dysfunction.  POSTOPERATIVE DIAGNOSES:  Previously implanted implantable cardioverter- defibrillator for nonischemic cardiomyopathy with complete heart block, now at [facility] with intercurrent resolution of left ventricular dysfunction.  PROCEDURE:  Explantation of a previously implanted CRT-D and implantation of a biventricular pacemaker.  Following the obtained informed consent, the patient was brought to Electrophysiology Laboratory and placed on a fluoroscopic table in supine position.  After routine prep and drape of the left upper chest, lidocaine was infiltrated caudal to the previous incision and carried down to layer of device pocket using sharp dissection with cautery.  The pocket was opened.  The leads were freed up over the 1st inch and a half or so, and the device was explanted.  Interrogation of the previously implanted atrial lead 5076 serial number [id number] through the device demonstrated in sensitivity of 3.8 with a pace impedance of 459 and a threshold of 0.5 at 0.4.  The previously implanted defibrillator lead model 0518 serial [id number] rate sensed portion demonstrated no intrinsic ventricular rhythm with impedance of 7 ohms and a threshold of 0.5 at 0.4, and the previously- implanted LV bipolar lead 4518 serial number [id number] demonstrated no intrinsic ventricular rhythm with impedance of 720 and a threshold 3.5 at 1.  With these acceptable  parameters recorded, the leads were attached to a [employer] biventricular pacemaker serial number [id number] model 172.  Ventricular pacing and P-synchronous pacing were identified.  The pocket was copiously irrigated with antibiotic containing saline solution.  Hemostasis was assured.  Leads and pulse generator were placed in the pocket.  The defibrillator high-voltage leads were capped and was secured behind the leads using a silk suture. The wound was then closed in 3 layers in normal fashion.  The wound was washed, dried, and a benzoin and Steri-Strip dressing was applied. Needle counts, sponge counts, and instrument counts were correct at the end of procedure.     Duke Salvia, MD, [facility]     SCK/MEDQ  D:  07/07/2011  T:  07/07/2011  Job:  [id number]  Electronically Signed by Sherryl Manges MD FACC on 07/11/2011 07:28:52 AM

## 2011-07-14 ENCOUNTER — Other Ambulatory Visit: Payer: Self-pay | Admitting: *Deleted

## 2011-07-16 ENCOUNTER — Ambulatory Visit (INDEPENDENT_AMBULATORY_CARE_PROVIDER_SITE_OTHER): Payer: Medicare Other | Admitting: *Deleted

## 2011-07-16 ENCOUNTER — Other Ambulatory Visit (INDEPENDENT_AMBULATORY_CARE_PROVIDER_SITE_OTHER): Payer: Medicare Other | Admitting: *Deleted

## 2011-07-16 DIAGNOSIS — I428 Other cardiomyopathies: Secondary | ICD-10-CM

## 2011-07-16 DIAGNOSIS — I442 Atrioventricular block, complete: Secondary | ICD-10-CM

## 2011-07-16 DIAGNOSIS — I429 Cardiomyopathy, unspecified: Secondary | ICD-10-CM

## 2011-07-16 DIAGNOSIS — R0989 Other specified symptoms and signs involving the circulatory and respiratory systems: Secondary | ICD-10-CM

## 2011-07-16 LAB — PACEMAKER DEVICE OBSERVATION
ATRIAL PACING PM: 3
DEVICE MODEL PM: 100410
LV LEAD IMPEDENCE PM: 705 Ohm
LV LEAD THRESHOLD: 3.5 V
RV LEAD THRESHOLD: 0.7 V

## 2011-07-17 LAB — BASIC METABOLIC PANEL
BUN: 22 mg/dL (ref 6–23)
Calcium: 9 mg/dL (ref 8.4–10.5)
Creatinine, Ser: 1 mg/dL (ref 0.4–1.2)
GFR: 53.47 mL/min — ABNORMAL LOW (ref >60.00)
Potassium: 4.3 mEq/L (ref 3.5–5.1)

## 2011-08-21 ENCOUNTER — Encounter: Payer: Medicare Other | Admitting: *Deleted

## 2011-08-25 ENCOUNTER — Other Ambulatory Visit (INDEPENDENT_AMBULATORY_CARE_PROVIDER_SITE_OTHER): Payer: Medicare Other

## 2011-08-25 DIAGNOSIS — E119 Type 2 diabetes mellitus without complications: Secondary | ICD-10-CM

## 2011-08-25 DIAGNOSIS — E785 Hyperlipidemia, unspecified: Secondary | ICD-10-CM

## 2011-08-25 DIAGNOSIS — M899 Disorder of bone, unspecified: Secondary | ICD-10-CM

## 2011-08-25 DIAGNOSIS — I10 Essential (primary) hypertension: Secondary | ICD-10-CM

## 2011-08-25 DIAGNOSIS — N19 Unspecified kidney failure: Secondary | ICD-10-CM

## 2011-08-25 LAB — RENAL FUNCTION PANEL
Creatinine, Ser: 0.9 mg/dL (ref 0.4–1.2)
GFR: 60.07 mL/min (ref >60.00)
Glucose, Bld: 123 mg/dL — ABNORMAL HIGH (ref 70–99)
Phosphorus: 4 mg/dL (ref 2.3–4.6)
Sodium: 143 mEq/L (ref 135–145)

## 2011-08-25 LAB — LIPID PANEL: Cholesterol: 161 mg/dL (ref 0–200)

## 2011-08-25 LAB — ALT: ALT: 9 U/L (ref 0–35)

## 2011-08-26 LAB — VITAMIN D 25 HYDROXY (VIT D DEFICIENCY, FRACTURES): Vit D, 25-Hydroxy: 27 ng/mL — ABNORMAL LOW (ref 30–89)

## 2011-08-27 ENCOUNTER — Ambulatory Visit (INDEPENDENT_AMBULATORY_CARE_PROVIDER_SITE_OTHER): Payer: Medicare Other | Admitting: Family Medicine

## 2011-08-27 ENCOUNTER — Encounter: Payer: Self-pay | Admitting: Family Medicine

## 2011-08-27 VITALS — BP 160/70 | HR 76 | Temp 97.6°F | Ht 63.0 in | Wt 155.2 lb

## 2011-08-27 DIAGNOSIS — Z23 Encounter for immunization: Secondary | ICD-10-CM

## 2011-08-27 DIAGNOSIS — E559 Vitamin D deficiency, unspecified: Secondary | ICD-10-CM | POA: Insufficient documentation

## 2011-08-27 DIAGNOSIS — E785 Hyperlipidemia, unspecified: Secondary | ICD-10-CM

## 2011-08-27 DIAGNOSIS — I10 Essential (primary) hypertension: Secondary | ICD-10-CM

## 2011-08-27 DIAGNOSIS — Z2911 Encounter for prophylactic immunotherapy for respiratory syncytial virus (RSV): Secondary | ICD-10-CM

## 2011-08-27 NOTE — Patient Instructions (Signed)
Blood pressure is still high - I need to consult with your cardiologist about what to do  Avoid salt in diet Labs ok - including cholesterol Your vitamin D level is low - please get vitamin D3 over the counter and take 2000 iu per day  I will decide on follow up after we make a plan for blood pressure  If you get a headache or any other symptoms - please let me know  Zoster vaccine today

## 2011-08-27 NOTE — Progress Notes (Signed)
Subjective:    Patient ID: [name], female    DOB: [birth year], 77 y.o.   MRN: [id number]  HPI Here for f/u of chronic conditions including HTN and lipids and vit D def Feels good overall   bp is 180/68    Today Per family - seems higher since they put her new pacemaker in  bp went down too low before pacemaker was put in  Then inc her coreg and her norvasc  Has appt with Dr Graciela Husbands soon  Quite high Dr Tenny Craw put her back on her ace - was higher  No cp or palpitations or headaches or edema  No side effects to medicines     Chemistry      Component Value Date/Time   NA 143 08/25/2011 0904   K 4.0 08/25/2011 0904   CL 106 08/25/2011 0904   CO2 30 08/25/2011 0904   BUN 20 08/25/2011 0904   CREATININE 0.9 08/25/2011 0904      Component Value Date/Time   CALCIUM 9.5 08/25/2011 0904   ALKPHOS 82 02/20/2011 0813   AST 14 08/25/2011 0904   ALT 9 08/25/2011 0904   BILITOT 0.4 02/20/2011 0813       Lipids in fair control with simvastatin  Good diet - low sat fat  Lab Results  Component Value Date   CHOL 161 08/25/2011   CHOL 166 02/20/2011   CHOL 152 08/26/2010   Lab Results  Component Value Date   HDL 42.00 08/25/2011   HDL 38.50* 02/20/2011   HDL 40.98* 08/26/2010   Lab Results  Component Value Date   LDLCALC 84 08/25/2011   Lab Results  Component Value Date   TRIG 177.0* 08/25/2011   TRIG 356.0* 02/20/2011   TRIG 238.0* 08/26/2010   Lab Results  Component Value Date   CHOLHDL 4 08/25/2011   CHOLHDL 4 02/20/2011   CHOLHDL 5 08/26/2010   Lab Results  Component Value Date   LDLDIRECT 63.2 02/20/2011   LDLDIRECT 75.3 08/26/2010   LDLDIRECT 53.8 02/18/2010     Vit D level low at 27 Is taking some vitamin D otc but not sure how much   Wants to get her zostavax today  Patient Active Problem List  Diagnoses  . HYPERTHYROIDISM  . DIABETES MELLITUS, TYPE II  . HYPERLIPIDEMIA  . GOUT  . HYPERTENSION  . CORONARY ARTERY DISEASE  . CARDIOMYOPATHY, SECONDARY  . AV BLOCK,  COMPLETE  . ANEURYSM, SPLENIC ARTERY  . ALLERGIC RHINITIS  . IRRITABLE BOWEL SYNDROME  . RENAL FAILURE  . UTI'S, CHRONIC  . OSTEOARTHRITIS  . OSTEOPENIA  . COLONIC POLYPS, ADENOMATOUS, HX OF  . biventricular defibrillator- [employer]  . GERD (gastroesophageal reflux disease)  . External otitis  . Immunization due  . Spell of transient neurologic symptoms  . Vitamin D deficiency   Past Medical History  Diagnosis Date  . Diabetes mellitus     Type II  . Hypertension   . Gout   . Allergy     allergic rhinitis  . CAD (coronary artery disease)     pulmonary hypertension// Cardiac cath 2001//2004 carotid dopplers Normal  . CHF (congestive heart failure)     non ish. cardiomyopathy// Dr. Tenny Craw cardiologist  . Cancer 2002    Renal cell cancer partial nephrectomy  . GERD (gastroesophageal reflux disease) 2003    EGD polyp; gastritis HH//Dr. Leone Payor GI  . Diverticula of intestine 2003    colonoscopy polyp; hem; divertic  . Cataract 2008  Right eye  . Personal history of kidney stones   . Hyperlipidemia     TRIG  . Osteopenia 2002    Dexa  . IBS (irritable bowel syndrome)   . Choledocholithiasis   . Transfusion history 1982    PUD/Bleed/Transfusion  . Osteoarthritis 07/12/2007/ 10/2004    spine and knees// LS Degen. changes   . Chronic kidney disease 05/2006    Renal failure ; hospitalized   Past Surgical History  Procedure Date  . Eye surgery 2008    Right eye cataract  . Cystoscopy w/ stone manipulation 2010- 2011    ERCPS and clearing of choledocholithiasis  . Ercp 2010  . Colonoscopy 09/2006    polyps  . Abdominal hysterectomy 1999    hysterectomy with bladder tack   History  Substance Use Topics  . Smoking status: Never Smoker   . Smokeless tobacco: Not on file  . Alcohol Use: No   Family History  Problem Relation Age of Onset  . Cancer Daughter     breast cancer  . Cancer Son 9    Esophageal Cancer  . Heart disease Daughter    Allergies    Allergen Reactions  . Aspirin     REACTION: stomach upset  . Atorvastatin     REACTION: muscle pain  . Celecoxib     REACTION: stomach upset  . Fenofibrate     REACTION: muscle pain  . Metformin     REACTION: diarrhea  . Moxifloxacin     REACTION: stomach upset  . Niacin     REACTION: muscle pain   Current Outpatient Prescriptions on File Prior to Visit  Medication Sig Dispense Refill  . acetaminophen (TYLENOL) 325 MG tablet OTC as directed       . amLODipine (NORVASC) 5 MG tablet Take 5 mg by mouth 2 (two) times daily.       . BD ULTRA-FINE LANCETS lancets Use to check glucose once daily and in the evening       . Cholecalciferol (VITAMIN D3) 1000 UNITS CAPS Take 1,000 Units by mouth daily.        Marland Kitchen esomeprazole (NEXIUM) 40 MG capsule Take 1 capsule (40 mg total) by mouth daily.  30 capsule  11  . gabapentin (NEURONTIN) 300 MG capsule Take 2 capsules (600 mg total) by mouth 2 (two) times daily.  120 capsule  11  . glucose blood test strip Test two times a day as directed       . insulin lispro protamine-insulin lispro (HUMALOG MIX 75/25 PEN) (75-25) 100 UNIT/ML SUSP Inject 8 Units into the skin 2 (two) times daily with a meal.        . loperamide (LOPERAMIDE A-D) 2 MG tablet As needed       . ramipril (ALTACE) 10 MG tablet Take 10 mg by mouth daily.        . simvastatin (ZOCOR) 20 MG tablet Take 20 mg by mouth at bedtime.        . dicyclomine (BENTYL) 10 MG capsule        . furosemide (LASIX) 20 MG tablet Take 2 tablets (40 mg total) by mouth every other day.  30 tablet  11      Review of Systems Review of Systems  Constitutional: Negative for fever, appetite change, fatigue and unexpected weight change.  Eyes: Negative for pain and visual disturbance.  Respiratory: Negative for cough and shortness of breath.   Cardiovascular: Negative for cp or palpitations  Gastrointestinal: Negative for nausea, diarrhea and constipation.  Genitourinary: Negative for urgency and  frequency.  Skin: Negative for pallor or rash   Neurological: Negative for weakness, light-headedness, numbness and headaches.  Hematological: Negative for adenopathy. Does not bruise/bleed easily.  Psychiatric/Behavioral: Negative for dysphoric mood. The patient is not nervous/anxious.          Objective:   Physical Exam  Constitutional: She appears well-developed and well-nourished. No distress.       overwt and well appearing   HENT:  Head: Normocephalic and atraumatic.  Mouth/Throat: Oropharynx is clear and moist.  Eyes: Conjunctivae and EOM are normal. Pupils are equal, round, and reactive to light. No scleral icterus.  Neck: Normal range of motion. Neck supple. No JVD present. Carotid bruit is not present. No thyromegaly present.  Cardiovascular: Normal rate, regular rhythm, normal heart sounds and intact distal pulses.  Exam reveals no gallop.   Pulmonary/Chest: Effort normal and breath sounds normal. No respiratory distress. She has no wheezes. She exhibits no tenderness.  Abdominal: Soft. Bowel sounds are normal. She exhibits no distension, no abdominal bruit and no mass. There is no tenderness.  Musculoskeletal: She exhibits edema.       Trace ankle edema bilat   Lymphadenopathy:    She has no cervical adenopathy.  Neurological: She is alert. She has normal reflexes. She displays no tremor. No cranial nerve deficit. She exhibits normal muscle tone. Coordination normal.  Skin: Skin is warm and dry. No rash noted. No erythema. No pallor.  Psychiatric: She has a normal mood and affect.       Delightful and cheerful as usual          Assessment & Plan:

## 2011-08-27 NOTE — Assessment & Plan Note (Signed)
Overall good control- staying at goal  No changes Disc goals for lipids and reasons to control them Rev labs with pt Rev low sat fat diet in detail

## 2011-08-27 NOTE — Assessment & Plan Note (Signed)
This is still high - despite re starting ace and inc her coreg and norvasc (per cardiology) Per her daughter it has been since she had new pacemaker  Today 2nd checkk 160s/70-- similar to what she had at home Chemistries/ renal fxn normal  Will see if cardiology can help [place] make a plan for that

## 2011-08-27 NOTE — Assessment & Plan Note (Signed)
Mildly low at 27 Disc how this affects bone and general health Adv to get vit D3 otc and take 2000 iu daily - will make a plan to monitor at next visit

## 2011-09-04 ENCOUNTER — Other Ambulatory Visit: Payer: Self-pay | Admitting: Family Medicine

## 2011-09-04 ENCOUNTER — Encounter: Payer: Self-pay | Admitting: Internal Medicine

## 2011-09-04 ENCOUNTER — Ambulatory Visit (INDEPENDENT_AMBULATORY_CARE_PROVIDER_SITE_OTHER): Payer: Medicare Other | Admitting: Internal Medicine

## 2011-09-04 DIAGNOSIS — I10 Essential (primary) hypertension: Secondary | ICD-10-CM

## 2011-09-04 DIAGNOSIS — I2510 Atherosclerotic heart disease of native coronary artery without angina pectoris: Secondary | ICD-10-CM

## 2011-09-04 DIAGNOSIS — I429 Cardiomyopathy, unspecified: Secondary | ICD-10-CM

## 2011-09-04 NOTE — Patient Instructions (Signed)
Your physician wants you to follow-up in: June 2013 You will receive a reminder letter in the mail two months in advance. If you don't receive a letter, please call our office to schedule the follow-up appointment.  

## 2011-09-04 NOTE — Progress Notes (Signed)
HPI Patient is a 77 year old with a history of NICM (now normal function), s/p ICD, dyslipidemia and hypertension  I saw her in October She was recently seen by [facility] in clnic .   BP was elevated.  Daughters log shows bp elevations since the end of November. Currently the patient denies SOB.  No dizziness.  No HA.  HAs been under increased stress due to death in family.  Allergies  Allergen Reactions  . Aspirin     REACTION: stomach upset  . Atorvastatin     REACTION: muscle pain  . Celecoxib     REACTION: stomach upset  . Fenofibrate     REACTION: muscle pain  . Metformin     REACTION: diarrhea  . Moxifloxacin     REACTION: stomach upset  . Niacin     REACTION: muscle pain    Current Outpatient Prescriptions  Medication Sig Dispense Refill  . acetaminophen (TYLENOL) 325 MG tablet OTC as directed       . amLODipine (NORVASC) 5 MG tablet Take 5 mg by mouth 2 (two) times daily.       . BD ULTRA-FINE LANCETS lancets Use to check glucose once daily and in the evening       . carvedilol (COREG) 25 MG tablet Take 25 mg by mouth 2 (two) times daily with a meal.        . Cholecalciferol (VITAMIN D3) 1000 UNITS CAPS Take 1,000 Units by mouth daily.        Marland Kitchen dicyclomine (BENTYL) 10 MG capsule 4 (four) times daily -  before meals and at bedtime. As needed      . esomeprazole (NEXIUM) 40 MG capsule Take 1 capsule (40 mg total) by mouth daily.  30 capsule  11  . gabapentin (NEURONTIN) 300 MG capsule Take 2 capsules (600 mg total) by mouth 2 (two) times daily.  120 capsule  11  . glucose blood test strip Test two times a day as directed       . insulin lispro protamine-insulin lispro (HUMALOG MIX 75/25 PEN) (75-25) 100 UNIT/ML SUSP Inject 8 Units into the skin 2 (two) times daily with a meal.        . loperamide (LOPERAMIDE A-D) 2 MG tablet As needed       . ramipril (ALTACE) 10 MG tablet Take 10 mg by mouth daily.        . simvastatin (ZOCOR) 20 MG tablet Take 20 mg by mouth at bedtime.           Past Medical History  Diagnosis Date  . Diabetes mellitus     Type II  . Hypertension   . Gout   . Allergy     allergic rhinitis  . CAD (coronary artery disease)     pulmonary hypertension// Cardiac cath 2001//2004 carotid dopplers Normal  . CHF (congestive heart failure)     non ish. cardiomyopathy// Dr. Tenny Craw cardiologist  . Cancer 2002    Renal cell cancer partial nephrectomy  . GERD (gastroesophageal reflux disease) 2003    EGD polyp; gastritis HH//Dr. Leone Payor GI  . Diverticula of intestine 2003    colonoscopy polyp; hem; divertic  . Cataract 2008    Right eye  . Personal history of kidney stones   . Hyperlipidemia     TRIG  . Osteopenia 2002    Dexa  . IBS (irritable bowel syndrome)   . Choledocholithiasis   . Transfusion history 1982    PUD/Bleed/Transfusion  .  Osteoarthritis 07/12/2007/ 10/2004    spine and knees// LS Degen. changes   . Chronic kidney disease 05/2006    Renal failure ; hospitalized    Past Surgical History  Procedure Date  . Eye surgery 2008    Right eye cataract  . Cystoscopy w/ stone manipulation 2010- 2011    ERCPS and clearing of choledocholithiasis  . Ercp 2010  . Colonoscopy 09/2006    polyps  . Abdominal hysterectomy 1999    hysterectomy with bladder tack    Family History  Problem Relation Age of Onset  . Cancer Daughter     breast cancer  . Cancer Son 88    Esophageal Cancer  . Heart disease Daughter     History   Social History  . Marital Status: Widowed    Spouse Name: N/A    Number of Children: N/A  . Years of Education: N/A   Occupational History  . Not on file.   Social History Main Topics  . Smoking status: Never Smoker   . Smokeless tobacco: Not on file  . Alcohol Use: No  . Drug Use: No  . Sexually Active:    Other Topics Concern  . Not on file   Social History Narrative  . No narrative on file    Review of Systems:  All systems reviewed.  They are negative to the above problem except as  previously stated.  Vital Signs: BP 118/60  Pulse 60  Ht 5\' 2"  (1.575 m)  Wt 151 lb (68.493 kg)  BMI 27.62 kg/m2  Physical Exam  Patient is in NAD HEENT:  Normocephalic, atraumatic. EOMI, PERRLA.  Neck: JVP is normal. No thyromegaly. No bruits.  Lungs: clear to auscultation. No rales no wheezes.  Heart: Regular rate and rhythm. Normal S1, S2. No S3.   No significant murmurs. PMI not displaced.  Abdomen:  Supple, nontender. Normal bowel sounds. No masses. No hepatomegaly.  Extremities:   Good distal pulses throughout. No lower extremity edema.  Musculoskeletal :moving all extremities.  Neuro:   alert and oriented x3.  CN II-XII grossly intact.   Assessment and Plan:

## 2011-09-04 NOTE — Assessment & Plan Note (Signed)
No symptoms of angina 

## 2011-09-04 NOTE — Assessment & Plan Note (Signed)
Volume status looks good.  Keep on same regimen

## 2011-09-04 NOTE — Assessment & Plan Note (Signed)
BP is very labile.  I cannot explain int.  When I saw her in October it had been low prior. I think her biggest risk now would be trying to manage her BP tightly and lead to hypotension Today's bp is good I would keep her on the same regimen. Daughter will keep log.  If BP continues to run in 160s or high she will call.  She does need to bring in cuff for calibration She has f/u in feb with Odessa Fleming.  I will see her in June, sooner if bp high.

## 2011-09-05 NOTE — Telephone Encounter (Signed)
[facility] [address] request refill Simvastatin 20 mg #30 x 11 and altace 10 mg #30 x 11.

## 2011-09-08 ENCOUNTER — Encounter: Payer: Self-pay | Admitting: Internal Medicine

## 2011-10-05 ENCOUNTER — Other Ambulatory Visit: Payer: Self-pay | Admitting: Family Medicine

## 2011-10-07 NOTE — Telephone Encounter (Signed)
[facility] [address] request refill Bentyl 10 mg. Pt last seen 08/27/11. With this request there was a note that source prescription had been discontinued.Spoke with Huntley Dec at [facility] and she said that note about source rx being d/c was not from them. Last filled 08/23/11.Is it OK to refill?

## 2011-10-07 NOTE — Telephone Encounter (Signed)
Will refill electronically  

## 2011-11-10 ENCOUNTER — Encounter: Payer: Self-pay | Admitting: Internal Medicine

## 2011-11-11 ENCOUNTER — Ambulatory Visit (INDEPENDENT_AMBULATORY_CARE_PROVIDER_SITE_OTHER): Payer: Medicare Other | Admitting: Internal Medicine

## 2011-11-11 ENCOUNTER — Encounter: Payer: Self-pay | Admitting: Internal Medicine

## 2011-11-11 DIAGNOSIS — I442 Atrioventricular block, complete: Secondary | ICD-10-CM

## 2011-11-11 DIAGNOSIS — Z95810 Presence of automatic (implantable) cardiac defibrillator: Secondary | ICD-10-CM

## 2011-11-11 LAB — PACEMAKER DEVICE OBSERVATION
AL AMPLITUDE: 3.7 mv
DEVICE MODEL PM: 100410
LV LEAD IMPEDENCE PM: 594 Ohm
LV LEAD THRESHOLD: 4 V
RV LEAD THRESHOLD: 0.5 V

## 2011-11-11 NOTE — Progress Notes (Signed)
HPI  [name] is a 78 y.o. female is seen in followup of NICM (now normal functionby echo September 2011), s/p CRT-D >>CRT-P   She has recently had problems with dizziness. There was concern about an intercurrent stroke. Interrogation of her device demonstrated no atrial fibrillation.  She is quite demented.    Past Medical History  Diagnosis Date  . Diabetes mellitus     Type II  . Hypertension   . Gout   . Allergy     allergic rhinitis  . CAD (coronary artery disease)     pulmonary hypertension// Cardiac cath 2001//2004 carotid dopplers Normal  . CHF (congestive heart failure)     non ish. cardiomyopathy// Dr. Tenny Craw cardiologist  . Cancer 2002    Renal cell cancer partial nephrectomy  . GERD (gastroesophageal reflux disease) 2003    EGD polyp; gastritis HH//Dr. Leone Payor GI  . Diverticula of intestine 2003    colonoscopy polyp; hem; divertic  . Cataract 2008    Right eye  . Personal history of kidney stones   . Hyperlipidemia     TRIG  . Osteopenia 2002    Dexa  . IBS (irritable bowel syndrome)   . Choledocholithiasis   . Transfusion history 1982    PUD/Bleed/Transfusion  . Osteoarthritis 07/12/2007/ 10/2004    spine and knees// LS Degen. changes   . Chronic kidney disease 05/2006    Renal failure ; hospitalized    Past Surgical History  Procedure Date  . Eye surgery 2008    Right eye cataract  . Cystoscopy w/ stone manipulation 2010- 2011    ERCPS and clearing of choledocholithiasis  . Ercp 2010  . Colonoscopy 09/2006    polyps  . Abdominal hysterectomy 1999    hysterectomy with bladder tack    Current Outpatient Prescriptions  Medication Sig Dispense Refill  . acetaminophen (TYLENOL) 325 MG tablet OTC as directed       . amLODipine (NORVASC) 5 MG tablet Take 5 mg by mouth 2 (two) times daily.       . BD ULTRA-FINE LANCETS lancets Use to check glucose once daily and in the evening       . carvedilol (COREG) 25 MG tablet Take 25 mg by mouth 2 (two)  times daily with a meal.        . Cholecalciferol (VITAMIN D3) 1000 UNITS CAPS Take 1,000 Units by mouth daily.        Marland Kitchen dicyclomine (BENTYL) 10 MG capsule take 1 to 2 capsules by mouth before meals  90 capsule  3  . dorzolamide-timolol (COSOPT) 22.3-6.8 MG/ML ophthalmic solution 1 drop 2 (two) times daily.      Marland Kitchen esomeprazole (NEXIUM) 40 MG capsule Take 1 capsule (40 mg total) by mouth daily.  30 capsule  11  . gabapentin (NEURONTIN) 300 MG capsule Take 2 capsules (600 mg total) by mouth 2 (two) times daily.  120 capsule  11  . glucose blood test strip Test two times a day as directed       . insulin lispro protamine-insulin lispro (HUMALOG MIX 75/25 PEN) (75-25) 100 UNIT/ML SUSP Inject 8 Units into the skin 2 (two) times daily with a meal.        . latanoprost (XALATAN) 0.005 % ophthalmic solution 1 drop at bedtime.      Marland Kitchen loperamide (LOPERAMIDE A-D) 2 MG tablet As needed       . Olopatadine HCl (PATADAY) 0.2 % SOLN Apply to eye. Left eye      .  ramipril (ALTACE) 10 MG capsule take 1 capsule by mouth once daily  30 capsule  11  . simvastatin (ZOCOR) 20 MG tablet take 1 tablet by mouth at bedtime  30 tablet  11    Allergies  Allergen Reactions  . Aspirin     REACTION: stomach upset  . Atorvastatin     REACTION: muscle pain  . Celecoxib     REACTION: stomach upset  . Fenofibrate     REACTION: muscle pain  . Metformin     REACTION: diarrhea  . Moxifloxacin     REACTION: stomach upset  . Niacin     REACTION: muscle pain    Review of Systems negative except from HPI and PMH  Physical Exam BP 134/60  Pulse 60  Ht 5\' 2"  (1.575 m)  Wt 147 lb 12.8 oz (67.042 kg)  BMI 27.03 kg/m2 Well developed and nourished in no acute distress HENT normal Neck supple  Clear Regular rate and rhythm, no murmurs or gallops Abd-soft with active BS No Clubbing cyanosis edema Skin-warm and dry  Grossly normal sensory and motor function    Assessment and  Plan

## 2011-11-12 NOTE — Assessment & Plan Note (Signed)
[facility]                        ELECTROPHYSIOLOGY OFFICE NOTE  [name], [name]                           MRN:          [id number] DATE:11/11/2011                            DOB:          08-11-1924   [name] is seen in followup for previously implanted CRT-D, now CRT-P. She is doing quite well.  She denies chest pain, shortness of breath, or peripheral edema.  Her medications are reviewed.  There is an intercurrent addition of anticataract medications.  Otherwise, cardiac-wise, she is on Altace, Zocor, Coreg, amlodipine.  Blood pressure today was 134/60 with a pulse of 60.  Lungs were clear. Neck veins were flat.  Heart sounds were regular.  Extremities were without edema.  Interrogation of her Guidant CRT-P INVIVE device demonstrated 100% ventricularly pacing with reasonable heart rate excursion, pacing threshold was 0.4 in the RV and 4 volts in the LV.  IMPRESSION: 1. Complete heart block. 2. Status post CRT-D, now CRT-P. 3. Congestive heart failure. 4. Dementia. 5. Hypertension.  The patient is stable.  We will see her again in 10 month's time.    Duke Salvia, MD, [facility]    SCK/MedQ  DD: 11/11/2011  DT: 11/12/2011  Job #: [id number]

## 2011-12-23 ENCOUNTER — Other Ambulatory Visit: Payer: Self-pay | Admitting: *Deleted

## 2011-12-23 MED ORDER — GABAPENTIN 300 MG PO CAPS: 600.0000 mg | ORAL_CAPSULE | Freq: Two times a day (BID) | ORAL | Status: DC

## 2011-12-23 NOTE — Telephone Encounter (Signed)
Faxed refill request from [facility], [place]. [address], [employer].

## 2011-12-23 NOTE — Telephone Encounter (Signed)
Will refill electronically  

## 2011-12-31 NOTE — Telephone Encounter (Signed)
Medicine [facility]. 

## 2012-01-01 ENCOUNTER — Encounter: Payer: Self-pay | Admitting: Internal Medicine

## 2012-01-01 ENCOUNTER — Ambulatory Visit (INDEPENDENT_AMBULATORY_CARE_PROVIDER_SITE_OTHER): Payer: Medicare Other | Admitting: Internal Medicine

## 2012-01-01 VITALS — BP 156/66 | HR 62 | Ht 62.0 in | Wt 145.0 lb

## 2012-01-01 DIAGNOSIS — I2510 Atherosclerotic heart disease of native coronary artery without angina pectoris: Secondary | ICD-10-CM

## 2012-01-01 NOTE — Patient Instructions (Signed)
Your physician wants you to follow-up in: end of December/beginning of Jan. 2014. You will receive a reminder letter in the mail two months in advance. If you don't receive a letter, please call our office to schedule the follow-up appointment.

## 2012-01-01 NOTE — Progress Notes (Signed)
HPI Patient is an 78 year old with a hsitory of NICM  S/p CRT P  Now with normal LV function.  Also dyslipidemiea and HTN.  Since seen in December she has done welll.  Breathing is good.  No SOB  No CP.  BP at home has ranged from 110s to 140.  Avg in 130s/  Glu in 110s to 120s. Labs from Dr Milinda Antis LDL was 84  HDL was 42   Vit D a little low  Now on supplement  Feels a little better. Allergies  Allergen Reactions  . Aspirin     REACTION: stomach upset  . Atorvastatin     REACTION: muscle pain  . Celecoxib     REACTION: stomach upset  . Fenofibrate     REACTION: muscle pain  . Metformin     REACTION: diarrhea  . Moxifloxacin     REACTION: stomach upset  . Niacin     REACTION: muscle pain    Current Outpatient Prescriptions  Medication Sig Dispense Refill  . acetaminophen (TYLENOL) 325 MG tablet OTC as directed       . amLODipine (NORVASC) 5 MG tablet Take 5 mg by mouth 2 (two) times daily.       . BD ULTRA-FINE LANCETS lancets Use to check glucose once daily and in the evening       . carvedilol (COREG) 25 MG tablet Take 25 mg by mouth 2 (two) times daily with a meal.        . Cholecalciferol (VITAMIN D3) 1000 UNITS CAPS Take 1,000 Units by mouth daily.        Marland Kitchen dicyclomine (BENTYL) 10 MG capsule take 1 to 2 capsules by mouth before meals  90 capsule  3  . dorzolamide-timolol (COSOPT) 22.3-6.8 MG/ML ophthalmic solution 1 drop 2 (two) times daily.      Marland Kitchen esomeprazole (NEXIUM) 40 MG capsule Take 1 capsule (40 mg total) by mouth daily.  30 capsule  11  . gabapentin (NEURONTIN) 300 MG capsule Take 2 capsules (600 mg total) by mouth 2 (two) times daily.  120 capsule  11  . Glucose Blood (BL TEST STRIP PACK VI)       . glucose blood test strip Test two times a day as directed       . insulin lispro protamine-insulin lispro (HUMALOG MIX 75/25 PEN) (75-25) 100 UNIT/ML SUSP Inject 8 Units into the skin 2 (two) times daily with a meal.        . latanoprost (XALATAN) 0.005 % ophthalmic  solution 1 drop at bedtime.      Marland Kitchen loperamide (LOPERAMIDE A-D) 2 MG tablet As needed       . ramipril (ALTACE) 10 MG capsule take 1 capsule by mouth once daily  30 capsule  11  . simvastatin (ZOCOR) 20 MG tablet take 1 tablet by mouth at bedtime  30 tablet  11    Past Medical History  Diagnosis Date  . Diabetes mellitus     Type II  . Hypertension   . Gout   . Allergy     allergic rhinitis  . CAD (coronary artery disease)     pulmonary hypertension// Cardiac cath 2001//2004 carotid dopplers Normal  . CHF (congestive heart failure)     non ish. cardiomyopathy// Dr. Tenny Craw cardiologist  . Cancer 2002    Renal cell cancer partial nephrectomy  . GERD (gastroesophageal reflux disease) 2003    EGD polyp; gastritis HH//Dr. Leone Payor GI  . Diverticula of  intestine 2003    colonoscopy polyp; hem; divertic  . Cataract 2008    Right eye  . Personal history of kidney stones   . Hyperlipidemia     TRIG  . Osteopenia 2002    Dexa  . IBS (irritable bowel syndrome)   . Choledocholithiasis   . Transfusion history 1982    PUD/Bleed/Transfusion  . Osteoarthritis 07/12/2007/ 10/2004    spine and knees// LS Degen. changes   . Chronic kidney disease 05/2006    Renal failure ; hospitalized    Past Surgical History  Procedure Date  . Eye surgery 2008    Right eye cataract  . Cystoscopy w/ stone manipulation 2010- 2011    ERCPS and clearing of choledocholithiasis  . Ercp 2010  . Colonoscopy 09/2006    polyps  . Abdominal hysterectomy 1999    hysterectomy with bladder tack    Family History  Problem Relation Age of Onset  . Cancer Daughter     breast cancer  . Cancer Son 40    Esophageal Cancer  . Heart disease Daughter     History   Social History  . Marital Status: Widowed    Spouse Name: N/A    Number of Children: N/A  . Years of Education: N/A   Occupational History  . Not on file.   Social History Main Topics  . Smoking status: Never Smoker   . Smokeless tobacco:  Not on file  . Alcohol Use: No  . Drug Use: No  . Sexually Active:    Other Topics Concern  . Not on file   Social History Narrative  . No narrative on file    Review of Systems:  All systems reviewed.  They are negative to the above problem except as previously stated.  Vital Signs: BP 156/66  Pulse 62  Ht 5\' 2"  (1.575 m)  Wt 145 lb (65.772 kg)  BMI 26.52 kg/m2  Physical Exam Patient is in NAD  HEENT:  Normocephalic, atraumatic. EOMI, PERRLA.  Neck: JVP is normal. No thyromegaly. No bruits.  Lungs: clear to auscultation. No rales no wheezes.  Heart: Regular rate and rhythm. Normal S1, S2. No S3.   No significant murmurs. PMI not displaced.  Abdomen:  Supple, nontender. Normal bowel sounds. No masses. No hepatomegaly.  Extremities:   Good distal pulses throughout. No lower extremity edema.  Musculoskeletal :moving all extremities.  Neuro:   alert and oriented x3.  CN II-XII grossly intact.   Assessment and Plan:  1.  CHF.  Near normalization of LV function with CRT.  Volume status looks good.  Keep on same regimen. 2.  HTN.  BP is increased today but home records show it is much better.  I would keep on same regimen 3.  Dyslpidemia.  Keep on same regimen.  Good control.  F/u 6 months.

## 2012-01-23 ENCOUNTER — Other Ambulatory Visit: Payer: Self-pay | Admitting: Nurse Practitioner

## 2012-01-23 NOTE — Telephone Encounter (Signed)
..   Requested Prescriptions   Pending Prescriptions Disp Refills  . carvedilol (COREG) 25 MG tablet [Pharmacy Med Name: CARVEDILOL 25 MG TABLET] 60 tablet 6    Sig: take 1 tablet by mouth twice a day

## 2012-02-20 ENCOUNTER — Other Ambulatory Visit: Payer: Self-pay | Admitting: Family Medicine

## 2012-02-20 NOTE — Telephone Encounter (Signed)
Done

## 2012-03-01 ENCOUNTER — Telehealth: Payer: Self-pay | Admitting: Internal Medicine

## 2012-03-01 DIAGNOSIS — I2510 Atherosclerotic heart disease of native coronary artery without angina pectoris: Secondary | ICD-10-CM

## 2012-03-01 DIAGNOSIS — I10 Essential (primary) hypertension: Secondary | ICD-10-CM

## 2012-03-01 MED ORDER — AMLODIPINE BESYLATE 5 MG PO TABS: 5.0000 mg | ORAL_TABLET | Freq: Two times a day (BID) | ORAL | Status: DC

## 2012-03-01 NOTE — Telephone Encounter (Signed)
New problem:  Need clarification  On direction / dosage on medication Norvasc.

## 2012-03-01 NOTE — Telephone Encounter (Signed)
I spoke with the patient's daughter. Her dosage of amlodipine has been clarified in her chart and sent to her pharmacy.

## 2012-03-27 ENCOUNTER — Other Ambulatory Visit: Payer: Self-pay | Admitting: Family Medicine

## 2012-03-29 ENCOUNTER — Other Ambulatory Visit: Payer: Self-pay

## 2012-03-29 MED ORDER — DICYCLOMINE HCL 10 MG PO CAPS: 10.0000 mg | ORAL_CAPSULE | Freq: Every day | ORAL | Status: DC | PRN

## 2012-03-29 NOTE — Telephone Encounter (Signed)
Last OV was 08/27/11. Ok to refill?

## 2012-03-29 NOTE — Telephone Encounter (Signed)
Px written for call in   

## 2012-05-07 ENCOUNTER — Telehealth: Payer: Self-pay | Admitting: Internal Medicine

## 2012-05-07 MED ORDER — FUROSEMIDE 40 MG PO TABS: ORAL_TABLET | ORAL | Status: DC

## 2012-05-07 NOTE — Telephone Encounter (Signed)
New Problem:    Patient's daughter called in to report that her mother has been retaining water, especially in her feet.  Please call back.

## 2012-05-07 NOTE — Telephone Encounter (Signed)
Called patient's daughter back and advised that she can take lasix 40mg  1 or 2 times per week per Dr.Ross.

## 2012-05-07 NOTE — Telephone Encounter (Signed)
Can take 1 to 2 x per wk.

## 2012-05-07 NOTE — Telephone Encounter (Signed)
Called patient's daughter. She has advised me that her Mom has gained 2 pounds and 6 oz. In 1 week. And has lower extremity edema. Was on lasix 40mg  every other day but has not had any since last FALL. Advised will discuss with Dr.Ross and call her back.

## 2012-05-10 ENCOUNTER — Telehealth: Payer: Self-pay | Admitting: Internal Medicine

## 2012-05-10 NOTE — Telephone Encounter (Signed)
Pt's dtr calling re dosage on amlodipine was different than what she had, 5mg  twice a day, was called in as take 1/2 tab 5mg , [phone] frances

## 2012-05-10 NOTE — Telephone Encounter (Signed)
Pt calling stating out of amlodipine and needs new RX--RX, according to last note should be amlodipine 5mg  twice/day--someone had called in amlodipine 5mg  1/2 tab twice/day--advised i spoke with pharmacist and they were going by old RX--advised pharmacist of correct dosage and called pt back,advising to check her prescriptions before taking--pt agrees--

## 2012-06-03 ENCOUNTER — Ambulatory Visit (INDEPENDENT_AMBULATORY_CARE_PROVIDER_SITE_OTHER): Payer: Medicare Other

## 2012-06-03 DIAGNOSIS — Z23 Encounter for immunization: Secondary | ICD-10-CM

## 2012-07-07 ENCOUNTER — Encounter: Payer: Self-pay | Admitting: *Deleted

## 2012-07-07 DIAGNOSIS — Z950 Presence of cardiac pacemaker: Secondary | ICD-10-CM | POA: Insufficient documentation

## 2012-07-12 ENCOUNTER — Encounter: Payer: Self-pay | Admitting: Internal Medicine

## 2012-07-12 ENCOUNTER — Ambulatory Visit (INDEPENDENT_AMBULATORY_CARE_PROVIDER_SITE_OTHER): Payer: Medicare Other | Admitting: Internal Medicine

## 2012-07-12 VITALS — BP 168/64 | HR 64 | Ht 62.0 in | Wt 150.0 lb

## 2012-07-12 DIAGNOSIS — I25810 Atherosclerosis of coronary artery bypass graft(s) without angina pectoris: Secondary | ICD-10-CM

## 2012-07-12 DIAGNOSIS — R0602 Shortness of breath: Secondary | ICD-10-CM

## 2012-07-12 DIAGNOSIS — Z79899 Other long term (current) drug therapy: Secondary | ICD-10-CM

## 2012-07-12 LAB — CBC WITH DIFFERENTIAL/PLATELET
Basophils Relative: 0.2 % (ref 0.0–3.0)
Eosinophils Absolute: 0.1 10*3/uL (ref 0.0–0.7)
Lymphocytes Relative: 16.7 % (ref 12.0–46.0)
MCHC: 32.8 g/dL (ref 30.0–36.0)
Neutrophils Relative %: 78.3 % — ABNORMAL HIGH (ref 43.0–77.0)
Platelets: 162 10*3/uL (ref 150.0–400.0)
RBC: 4.12 Mil/uL (ref 3.87–5.11)
WBC: 8.6 10*3/uL (ref 4.5–10.5)

## 2012-07-12 LAB — BASIC METABOLIC PANEL
BUN: 21 mg/dL (ref 6–23)
CO2: 31 mEq/L (ref 19–32)
Calcium: 9.3 mg/dL (ref 8.4–10.5)
Creatinine, Ser: 0.9 mg/dL (ref 0.4–1.2)

## 2012-07-12 LAB — TSH: TSH: 0.62 u[IU]/mL (ref 0.35–5.50)

## 2012-07-12 NOTE — Progress Notes (Signed)
HPI Patient is an 78 year old with a history of NICM (s/p CRT P) Repeat eval witn normal LV function.  Also a history of HL and HTN.   I saw her in clnic in April. Since seen she has done well  She denies CP  Breathig is not signif changed  Has occasional SOB  No PND  No palpitaitons.    Allergies  Allergen Reactions  . Aspirin     REACTION: stomach upset  . Atorvastatin     REACTION: muscle pain  . Celecoxib     REACTION: stomach upset  . Fenofibrate     REACTION: muscle pain  . Metformin     REACTION: diarrhea  . Moxifloxacin     REACTION: stomach upset  . Niacin     REACTION: muscle pain    Current Outpatient Prescriptions  Medication Sig Dispense Refill  . acetaminophen (TYLENOL) 325 MG tablet OTC as directed       . amLODipine (NORVASC) 5 MG tablet Take 1 tablet (5 mg total) by mouth 2 (two) times daily.  60 tablet  11  . BD ULTRA-FINE LANCETS lancets Use to check glucose once daily and in the evening       . carvedilol (COREG) 25 MG tablet take 1 tablet by mouth twice a day  60 tablet  6  . Cholecalciferol (VITAMIN D3) 1000 UNITS CAPS Take 1,000 Units by mouth daily.        Marland Kitchen dicyclomine (BENTYL) 10 MG capsule Take one capsule by mouth before meal      . dorzolamide-timolol (COSOPT) 22.3-6.8 MG/ML ophthalmic solution 1 drop 2 (two) times daily.      . furosemide (LASIX) 40 MG tablet 1tab 1 or 2 times per week  30 tablet  3  . gabapentin (NEURONTIN) 300 MG capsule Take 2 capsules (600 mg total) by mouth 2 (two) times daily.  120 capsule  11  . Glucose Blood (BL TEST STRIP PACK VI)       . glucose blood test strip Test two times a day as directed       . insulin lispro protamine-insulin lispro (HUMALOG MIX 75/25 PEN) (75-25) 100 UNIT/ML SUSP Inject 8 Units into the skin 2 (two) times daily with a meal.        . latanoprost (XALATAN) 0.005 % ophthalmic solution 1 drop at bedtime.      Marland Kitchen loperamide (LOPERAMIDE A-D) 2 MG tablet As needed       . NEXIUM 40 MG capsule take 1  capsule by mouth once daily  30 capsule  11  . ramipril (ALTACE) 10 MG capsule take 1 capsule by mouth once daily  30 capsule  11  . simvastatin (ZOCOR) 20 MG tablet take 1 tablet by mouth at bedtime  30 tablet  11  . DISCONTD: dicyclomine (BENTYL) 10 MG capsule Take 1 capsule (10 mg total) by mouth daily as needed.  90 capsule  3    Past Medical History  Diagnosis Date  . Diabetes mellitus     Type II  . Hypertension   . Gout   . Allergy     allergic rhinitis  . CAD (coronary artery disease)     pulmonary hypertension// Cardiac cath 2001//2004 carotid dopplers Normal  . CHF (congestive heart failure)     non ish. cardiomyopathy// Dr. Tenny Craw cardiologist  . Cancer 2002    Renal cell cancer partial nephrectomy  . GERD (gastroesophageal reflux disease) 2003    EGD  polyp; gastritis HH//Dr. Leone Payor GI  . Diverticula of intestine 2003    colonoscopy polyp; hem; divertic  . Cataract 2008    Right eye  . Personal history of kidney stones   . Hyperlipidemia     TRIG  . Osteopenia 2002    Dexa  . IBS (irritable bowel syndrome)   . Choledocholithiasis   . Transfusion history 1982    PUD/Bleed/Transfusion  . Osteoarthritis 07/12/2007/ 10/2004    spine and knees// LS Degen. changes   . Chronic kidney disease 05/2006    Renal failure ; hospitalized    Past Surgical History  Procedure Date  . Eye surgery 2008    Right eye cataract  . Cystoscopy w/ stone manipulation 2010- 2011    ERCPS and clearing of choledocholithiasis  . Ercp 2010  . Colonoscopy 09/2006    polyps  . Abdominal hysterectomy 1999    hysterectomy with bladder tack    Family History  Problem Relation Age of Onset  . Cancer Daughter     breast cancer  . Cancer Son 78    Esophageal Cancer  . Heart disease Daughter     History   Social History  . Marital Status: Widowed    Spouse Name: N/A    Number of Children: N/A  . Years of Education: N/A   Occupational History  . Not on file.   Social History  Main Topics  . Smoking status: Never Smoker   . Smokeless tobacco: Not on file  . Alcohol Use: No  . Drug Use: No  . Sexually Active:    Other Topics Concern  . Not on file   Social History Narrative  . No narrative on file    Review of Systems:  All systems reviewed.  They are negative to the above problem except as previously stated.  Vital Signs: BP 168/64  Pulse 64  Ht 5\' 2"  (1.575 m)  Wt 150 lb (68.04 kg)  BMI 27.44 kg/m2  Physical Exam Patient is in NAD HEENT:  Normocephalic, atraumatic. EOMI, PERRLA.  Neck: JVP is normal.  No bruits.  Lungs: clear to auscultation. No rales no wheezes.  Heart: Regular rate and rhythm. Normal S1, S2. No S3.   No significant murmurs. PMI not displaced.  Abdomen:  Supple, nontender. Normal bowel sounds. No masses. No hepatomegaly.  Extremities:   Good distal pulses throughout. Triv Left lower extremity edema.  Musculoskeletal :moving all extremities.  Neuro:   alert and oriented x3.  CN II-XII grossly intact.  EKG:  SR  64  Frst degree AV block  Ventricular paced.     Impression  1.   NICM  Volume status looks good.  Will check labs2  2.  HTN  Good control at home  Follow  3  HL  Ate today  Will need to be checked at a future date  4.  DM  Hgb A1C was 6 at Dr Levi Aland    Assessment and Plan:

## 2012-07-12 NOTE — Patient Instructions (Addendum)
Lab work today. We will call you with results.  Your physician wants you to follow-up in: 9 months You will receive a reminder letter in the mail two months in advance. If you don't receive a letter, please call our office to schedule the follow-up appointment.  

## 2012-07-15 ENCOUNTER — Encounter: Payer: Self-pay | Admitting: Internal Medicine

## 2012-07-15 ENCOUNTER — Ambulatory Visit (INDEPENDENT_AMBULATORY_CARE_PROVIDER_SITE_OTHER): Payer: Medicare Other | Admitting: Internal Medicine

## 2012-07-15 VITALS — BP 165/60 | HR 58 | Wt 149.0 lb

## 2012-07-15 DIAGNOSIS — I442 Atrioventricular block, complete: Secondary | ICD-10-CM | POA: Insufficient documentation

## 2012-07-15 DIAGNOSIS — I429 Cardiomyopathy, unspecified: Secondary | ICD-10-CM

## 2012-07-15 DIAGNOSIS — Z950 Presence of cardiac pacemaker: Secondary | ICD-10-CM

## 2012-07-15 LAB — PACEMAKER DEVICE OBSERVATION
AL AMPLITUDE: 3.3 mv
AL IMPEDENCE PM: 410 Ohm
AL THRESHOLD: 0.5 V
DEVICE MODEL PM: 100410
LV LEAD IMPEDENCE PM: 611 Ohm
RV LEAD IMPEDENCE PM: 872 Ohm
RV LEAD THRESHOLD: 0.5 V

## 2012-07-15 NOTE — Assessment & Plan Note (Signed)
Stable post pacing 

## 2012-07-15 NOTE — Assessment & Plan Note (Signed)
The patient's device was interrogated.  The information was reviewed. No changes were made in the programming.    

## 2012-07-15 NOTE — Patient Instructions (Signed)
Your physician wants you to follow-up in: 1 year. You will receive a reminder letter in the mail two months in advance. If you don't receive a letter, please call our office to schedule the follow-up appointment.  

## 2012-07-15 NOTE — Progress Notes (Signed)
Patient Care Team: Judy Pimple, MD as PCP - General   HPI  [name] is a 78 y.o. female is seen in followup of NICM (now normal functionby echo September 2011), s/p CRT-D >>CRT-P  She has recently had problems with dizziness. There was concern about an intercurrent stroke. Interrogation of her device demonstrated no atrial fibrillation.  She is quite demented.   The patient denies chest pain, shortness of breath, nocturnal dyspnea, orthopnea or peripheral edema.  There have been no palpitations, lightheadedness or syncope.  He is   Past Medical History  Diagnosis Date  . Diabetes mellitus     Type II  . Hypertension   . Gout   . Allergy     allergic rhinitis  . CAD (coronary artery disease)     pulmonary hypertension// Cardiac cath 2001//2004 carotid dopplers Normal  . CHF (congestive heart failure)     non ish. cardiomyopathy// Dr. Tenny Craw cardiologist  . Cancer 2002    Renal cell cancer partial nephrectomy  . GERD (gastroesophageal reflux disease) 2003    EGD polyp; gastritis HH//Dr. Leone Payor [facility]  . Diverticula of intestine 2003    colonoscopy polyp; hem; divertic  . Cataract 2008    Right eye  . Personal history of kidney stones   . Hyperlipidemia     TRIG  . Osteopenia 2002    Dexa  . IBS (irritable bowel syndrome)   . Choledocholithiasis   . Transfusion history 1982    PUD/Bleed/Transfusion  . Osteoarthritis 07/12/2007/ 10/2004    spine and knees// LS Degen. changes   . Chronic kidney disease 05/2006    Renal failure ; hospitalized    Past Surgical History  Procedure Date  . Eye surgery 2008    Right eye cataract  . Cystoscopy w/ stone manipulation 2010- 2011    ERCPS and clearing of choledocholithiasis  . Ercp 2010  . Colonoscopy 09/2006    polyps  . Abdominal hysterectomy 1999    hysterectomy with bladder tack    Current Outpatient Prescriptions  Medication Sig Dispense Refill  . acetaminophen (TYLENOL) 325 MG tablet OTC as directed       .  amLODipine (NORVASC) 5 MG tablet Take 1 tablet (5 mg total) by mouth 2 (two) times daily.  60 tablet  11  . BD ULTRA-FINE LANCETS lancets Use to check glucose once daily and in the evening       . carvedilol (COREG) 25 MG tablet take 1 tablet by mouth twice a day  60 tablet  6  . Cholecalciferol (VITAMIN D3) 1000 UNITS CAPS Take 1,000 Units by mouth daily.        Marland Kitchen dicyclomine (BENTYL) 10 MG capsule Take one capsule by mouth before meal      . dorzolamide-timolol (COSOPT) 22.3-6.8 MG/ML ophthalmic solution 1 drop 2 (two) times daily.      . furosemide (LASIX) 40 MG tablet 1tab 1 or 2 times per week  30 tablet  3  . gabapentin (NEURONTIN) 300 MG capsule Take 2 capsules (600 mg total) by mouth 2 (two) times daily.  120 capsule  11  . Glucose Blood (BL TEST STRIP PACK VI)       . glucose blood test strip Test two times a day as directed       . insulin lispro protamine-insulin lispro (HUMALOG MIX 75/25 PEN) (75-25) 100 UNIT/ML SUSP Inject 8 Units into the skin 2 (two) times daily with a meal.        .  latanoprost (XALATAN) 0.005 % ophthalmic solution 1 drop at bedtime.      Marland Kitchen loperamide (LOPERAMIDE A-D) 2 MG tablet As needed       . NEXIUM 40 MG capsule take 1 capsule by mouth once daily  30 capsule  11  . ramipril (ALTACE) 10 MG capsule take 1 capsule by mouth once daily  30 capsule  11  . simvastatin (ZOCOR) 20 MG tablet take 1 tablet by mouth at bedtime  30 tablet  11    Allergies  Allergen Reactions  . Aspirin     REACTION: stomach upset  . Atorvastatin     REACTION: muscle pain  . Celecoxib     REACTION: stomach upset  . Fenofibrate     REACTION: muscle pain  . Metformin     REACTION: diarrhea  . Moxifloxacin     REACTION: stomach upset  . Niacin     REACTION: muscle pain    Review of Systems negative except from HPI and PMH  Physical Exam BP 165/60  Pulse 58  Wt 149 lb (67.586 kg)  Well developed and nourished in no acute distress HENT normal Neck  supple Clear Regular rate and rhythm, no murmurs or gallops Abd-soft   No Clubbing cyanosis edema Skin-warm and dry Grossly normal sensory and motor function     Assessment and  Plan

## 2012-07-15 NOTE — Assessment & Plan Note (Signed)
stable °

## 2012-08-05 ENCOUNTER — Other Ambulatory Visit: Payer: Self-pay

## 2012-08-05 MED ORDER — DICYCLOMINE HCL 10 MG PO CAPS: ORAL_CAPSULE | ORAL | Status: DC

## 2012-08-05 NOTE — Telephone Encounter (Signed)
Please give her 6 mo of refils , thanks 

## 2012-08-05 NOTE — Telephone Encounter (Signed)
Pt left v/m requesting refill dicyclomine to [facility] [address].Please advise.

## 2012-08-06 ENCOUNTER — Other Ambulatory Visit: Payer: Self-pay | Admitting: Family Medicine

## 2012-08-06 ENCOUNTER — Other Ambulatory Visit: Payer: Self-pay | Admitting: Internal Medicine

## 2012-08-06 MED ORDER — DICYCLOMINE HCL 10 MG PO CAPS: 10.0000 mg | ORAL_CAPSULE | Freq: Three times a day (TID) | ORAL | Status: DC

## 2012-08-06 NOTE — Telephone Encounter (Signed)
Ok to refill? No recent appt. Or labs an no future appt

## 2012-08-06 NOTE — Telephone Encounter (Signed)
appt scheduled and meds refilled 

## 2012-08-06 NOTE — Telephone Encounter (Signed)
Please have her f/u jan or feb refil med until then, thanks

## 2012-09-06 ENCOUNTER — Ambulatory Visit (INDEPENDENT_AMBULATORY_CARE_PROVIDER_SITE_OTHER): Payer: Medicare Other | Admitting: Family Medicine

## 2012-09-06 ENCOUNTER — Encounter: Payer: Self-pay | Admitting: Family Medicine

## 2012-09-06 VITALS — BP 132/68 | HR 74 | Temp 97.8°F | Ht 62.0 in | Wt 148.8 lb

## 2012-09-06 DIAGNOSIS — E785 Hyperlipidemia, unspecified: Secondary | ICD-10-CM

## 2012-09-06 DIAGNOSIS — I10 Essential (primary) hypertension: Secondary | ICD-10-CM

## 2012-09-06 DIAGNOSIS — E559 Vitamin D deficiency, unspecified: Secondary | ICD-10-CM

## 2012-09-06 DIAGNOSIS — Z23 Encounter for immunization: Secondary | ICD-10-CM

## 2012-09-06 LAB — COMPREHENSIVE METABOLIC PANEL
ALT: 9 U/L (ref 0–35)
CO2: 28 mEq/L (ref 19–32)
Calcium: 9.2 mg/dL (ref 8.4–10.5)
Chloride: 105 mEq/L (ref 96–112)
Creatinine, Ser: 0.9 mg/dL (ref 0.4–1.2)
GFR: 62.21 mL/min (ref >60.00)
Glucose, Bld: 141 mg/dL — ABNORMAL HIGH (ref 70–99)
Total Bilirubin: 0.6 mg/dL (ref 0.3–1.2)

## 2012-09-06 LAB — LIPID PANEL
Cholesterol: 142 mg/dL (ref 0–200)
HDL: 33.4 mg/dL — ABNORMAL LOW (ref >39.00)
VLDL: 39.4 mg/dL (ref 0.0–40.0)

## 2012-09-06 MED ORDER — SIMVASTATIN 20 MG PO TABS: 20.0000 mg | ORAL_TABLET | Freq: Every day | ORAL | Status: DC

## 2012-09-06 MED ORDER — RAMIPRIL 10 MG PO CAPS: 10.0000 mg | ORAL_CAPSULE | Freq: Every day | ORAL | Status: DC

## 2012-09-06 NOTE — Assessment & Plan Note (Signed)
bp in fair control at this time  No changes needed  Disc lifstyle change with low sodium diet and exercise  Labs today Per pt- renal fxn has been stable-sees renal

## 2012-09-06 NOTE — Patient Instructions (Addendum)
Keep up the good work Tetanus shot today  Labs today Stay active, I'm glad you are feeling good

## 2012-09-06 NOTE — Progress Notes (Signed)
Subjective:    Patient ID: [name], female    DOB: 11/18/1923, 78 y.o.   MRN: [id number]  HPI Here for f/u of chronic health problems   Is feeling good overall  Cardiology f/u- Dr Tenny Craw and Dr Graciela Husbands in the fall - and she was stable  DM- sees Dr Talmage Nap - 6.0 a1c - very very good in the fall - this makes her feel better  Kidney fxn is stable also   Wt is stable with bmi of 27  bp is stable today  No cp or palpitations or headaches or edema  No side effects to medicines  BP Readings from Last 3 Encounters:  09/06/12 132/68  07/15/12 165/60  07/12/12 168/64     Is much improved   Cholesterol Lab Results  Component Value Date   CHOL 161 08/25/2011   HDL 42.00 08/25/2011   LDLCALC 84 08/25/2011   LDLDIRECT 63.2 02/20/2011   TRIG 177.0* 08/25/2011   CHOLHDL 4 08/25/2011   due for a check on zocor and diet   Falls- no falls at all - uses her walker and does not go too fast  Mood-- is very good - motivated and cheerful  Td- due for that   Vit D-has been taing that regularly  Patient Active Problem List  Diagnosis  . DIABETES MELLITUS, TYPE II  . HYPERLIPIDEMIA  . GOUT  . HYPERTENSION  . CORONARY ARTERY DISEASE  . CARDIOMYOPATHY, SECONDARY  . AV BLOCK, COMPLETE  . ANEURYSM, SPLENIC ARTERY  . ALLERGIC RHINITIS  . IRRITABLE BOWEL SYNDROME  . RENAL FAILURE  . UTI'S, CHRONIC  . OSTEOARTHRITIS  . OSTEOPENIA  . COLONIC POLYPS, ADENOMATOUS, HX OF  . GERD (gastroesophageal reflux disease)  . External otitis  . Immunization due  . Spell of transient neurologic symptoms  . Vitamin D deficiency disease  . Corporate investment banker  . Complete heart block   Past Medical History  Diagnosis Date  . Diabetes mellitus     Type II  . Hypertension   . Gout   . Allergy     allergic rhinitis  . CAD (coronary artery disease)     pulmonary hypertension// Cardiac cath 2001//2004 carotid dopplers Normal  . CHF (congestive heart failure)     non ish. cardiomyopathy// Dr.  Tenny Craw cardiologist  . Cancer 2002    Renal cell cancer partial nephrectomy  . GERD (gastroesophageal reflux disease) 2003    EGD polyp; gastritis HH//Dr. Leone Payor [facility]  . Diverticula of intestine 2003    colonoscopy polyp; hem; divertic  . Cataract 2008    Right eye  . Personal history of kidney stones   . Hyperlipidemia     TRIG  . Osteopenia 2002    Dexa  . IBS (irritable bowel syndrome)   . Choledocholithiasis   . Transfusion history 1982    PUD/Bleed/Transfusion  . Osteoarthritis 07/12/2007/ 10/2004    spine and knees// LS Degen. changes   . Chronic kidney disease 05/2006    Renal failure ; hospitalized   Past Surgical History  Procedure Date  . Eye surgery 2008    Right eye cataract  . Cystoscopy w/ stone manipulation 2010- 2011    ERCPS and clearing of choledocholithiasis  . Ercp 2010  . Colonoscopy 09/2006    polyps  . Abdominal hysterectomy 1999    hysterectomy with bladder tack   History  Substance Use Topics  . Smoking status: Never Smoker   . Smokeless tobacco: Not on file  .  Alcohol Use: No   Family History  Problem Relation Age of Onset  . Cancer Daughter     breast cancer  . Cancer Son 2    Esophageal Cancer  . Heart disease Daughter    Allergies  Allergen Reactions  . Aspirin     REACTION: stomach upset  . Atorvastatin     REACTION: muscle pain  . Celecoxib     REACTION: stomach upset  . Fenofibrate     REACTION: muscle pain  . Metformin     REACTION: diarrhea  . Moxifloxacin     REACTION: stomach upset  . Niacin     REACTION: muscle pain   Current Outpatient Prescriptions on File Prior to Visit  Medication Sig Dispense Refill  . acetaminophen (TYLENOL) 325 MG tablet OTC as directed       . amLODipine (NORVASC) 5 MG tablet Take 1 tablet (5 mg total) by mouth 2 (two) times daily.  60 tablet  11  . BD ULTRA-FINE LANCETS lancets Use to check glucose once daily and in the evening       . carvedilol (COREG) 25 MG tablet take 1 tablet by  mouth twice a day  60 tablet  6  . Cholecalciferol (VITAMIN D3) 1000 UNITS CAPS Take 1,000 Units by mouth daily.        Marland Kitchen dicyclomine (BENTYL) 10 MG capsule Take 1 capsule (10 mg total) by mouth 3 (three) times daily before meals.  90 capsule  1  . dorzolamide-timolol (COSOPT) 22.3-6.8 MG/ML ophthalmic solution 1 drop 2 (two) times daily.      . furosemide (LASIX) 40 MG tablet 1tab 1 or 2 times per week  30 tablet  3  . gabapentin (NEURONTIN) 300 MG capsule Take 2 capsules (600 mg total) by mouth 2 (two) times daily.  120 capsule  11  . Glucose Blood (BL TEST STRIP PACK VI)       . glucose blood test strip Test two times a day as directed       . insulin lispro protamine-insulin lispro (HUMALOG MIX 75/25 PEN) (75-25) 100 UNIT/ML SUSP Inject 8 Units into the skin 2 (two) times daily with a meal.        . latanoprost (XALATAN) 0.005 % ophthalmic solution 1 drop at bedtime.      Marland Kitchen loperamide (LOPERAMIDE A-D) 2 MG tablet As needed       . NEXIUM 40 MG capsule take 1 capsule by mouth once daily  30 capsule  11  . ramipril (ALTACE) 10 MG capsule take 1 capsule by mouth once daily  30 capsule  0  . simvastatin (ZOCOR) 20 MG tablet take 1 tablet by mouth at bedtime  30 tablet  0     Review of Systems Review of Systems  Constitutional: Negative for fever, appetite change, fatigue and unexpected weight change.  Eyes: Negative for pain and visual disturbance.  Respiratory: Negative for cough and shortness of breath.   Cardiovascular: Negative for cp or palpitations    Gastrointestinal: Negative for nausea, diarrhea and constipation.  Genitourinary: Negative for urgency and frequency.  Skin: Negative for pallor or rash   Neurological: Negative for weakness, light-headedness, numbness and headaches.  Hematological: Negative for adenopathy. Does not bruise/bleed easily.  Psychiatric/Behavioral: Negative for dysphoric mood. The patient is not nervous/anxious.         Objective:   Physical Exam   Constitutional: She appears well-developed and well-nourished. No distress.       Frail  appearing elderly female with a walker  HENT:  Head: Normocephalic and atraumatic.  Mouth/Throat: Oropharynx is clear and moist.  Eyes: Conjunctivae normal and EOM are normal. Pupils are equal, round, and reactive to light. Right eye exhibits no discharge. Left eye exhibits no discharge. No scleral icterus.  Neck: Normal range of motion. Neck supple. No JVD present. Carotid bruit is not present. No thyromegaly present.  Cardiovascular: Normal rate, regular rhythm and intact distal pulses.  Exam reveals no gallop.   Pulmonary/Chest: Effort normal and breath sounds normal. No respiratory distress. She has no wheezes. She has no rales.       No crackles  Abdominal: Soft. Bowel sounds are normal. She exhibits no abdominal bruit and no mass.  Musculoskeletal: She exhibits no edema.  Lymphadenopathy:    She has no cervical adenopathy.  Neurological: She is alert. She has normal reflexes. No cranial nerve deficit. She exhibits normal muscle tone. Coordination normal.  Skin: Skin is warm and dry. No rash noted. No pallor.  Psychiatric: She has a normal mood and affect.       Cheerful and talkative           Assessment & Plan:

## 2012-09-06 NOTE — Assessment & Plan Note (Signed)
D level today-on current supplementation Not much sun exp  Disc imp to bone and overall health

## 2012-09-06 NOTE — Assessment & Plan Note (Signed)
Lab today with zocor and diet Has been well controlled Rev low sat fat diet

## 2012-09-07 ENCOUNTER — Encounter: Payer: Self-pay | Admitting: *Deleted

## 2012-09-07 LAB — VITAMIN D 25 HYDROXY (VIT D DEFICIENCY, FRACTURES): Vit D, 25-Hydroxy: 47 ng/mL (ref 30–89)

## 2012-12-17 ENCOUNTER — Other Ambulatory Visit: Payer: Self-pay | Admitting: Family Medicine

## 2012-12-17 MED ORDER — GABAPENTIN 300 MG PO CAPS: 600.0000 mg | ORAL_CAPSULE | Freq: Two times a day (BID) | ORAL | Status: DC

## 2012-12-29 ENCOUNTER — Ambulatory Visit (INDEPENDENT_AMBULATORY_CARE_PROVIDER_SITE_OTHER): Payer: Medicare Other | Admitting: *Deleted

## 2012-12-29 ENCOUNTER — Other Ambulatory Visit: Payer: Self-pay

## 2012-12-29 DIAGNOSIS — I442 Atrioventricular block, complete: Secondary | ICD-10-CM

## 2012-12-29 DIAGNOSIS — I429 Cardiomyopathy, unspecified: Secondary | ICD-10-CM

## 2012-12-29 LAB — PACEMAKER DEVICE OBSERVATION
AL IMPEDENCE PM: 428 Ohm
AL THRESHOLD: 0.4 V
ATRIAL PACING PM: 1

## 2012-12-29 NOTE — Progress Notes (Signed)
PPM check 

## 2013-01-13 ENCOUNTER — Encounter: Payer: Self-pay | Admitting: Internal Medicine

## 2013-01-24 ENCOUNTER — Other Ambulatory Visit: Payer: Self-pay | Admitting: Family Medicine

## 2013-02-09 ENCOUNTER — Ambulatory Visit: Payer: Self-pay | Admitting: Ophthalmology

## 2013-02-09 LAB — PROTIME-INR

## 2013-02-13 HISTORY — PX: CATARACT EXTRACTION W/ INTRAOCULAR LENS IMPLANT: SHX1309

## 2013-02-20 ENCOUNTER — Other Ambulatory Visit: Payer: Self-pay | Admitting: Internal Medicine

## 2013-02-21 ENCOUNTER — Encounter: Payer: Self-pay | Admitting: Family Medicine

## 2013-02-23 ENCOUNTER — Ambulatory Visit: Payer: Self-pay | Admitting: Ophthalmology

## 2013-03-13 ENCOUNTER — Other Ambulatory Visit: Payer: Self-pay | Admitting: Family Medicine

## 2013-03-14 NOTE — Telephone Encounter (Signed)
Please give her 3 mo of refills

## 2013-03-14 NOTE — Telephone Encounter (Signed)
Electronic refill request, no recent/future appt., please advise  

## 2013-03-15 NOTE — Telephone Encounter (Signed)
done

## 2013-03-23 ENCOUNTER — Other Ambulatory Visit: Payer: Self-pay | Admitting: *Deleted

## 2013-03-23 DIAGNOSIS — I10 Essential (primary) hypertension: Secondary | ICD-10-CM

## 2013-03-23 DIAGNOSIS — I2510 Atherosclerotic heart disease of native coronary artery without angina pectoris: Secondary | ICD-10-CM

## 2013-03-23 MED ORDER — AMLODIPINE BESYLATE 5 MG PO TABS: 5.0000 mg | ORAL_TABLET | Freq: Two times a day (BID) | ORAL | Status: DC

## 2013-03-25 ENCOUNTER — Ambulatory Visit (INDEPENDENT_AMBULATORY_CARE_PROVIDER_SITE_OTHER): Payer: Medicare Other | Admitting: Internal Medicine

## 2013-03-25 VITALS — BP 182/70 | HR 53 | Wt 142.0 lb

## 2013-03-25 DIAGNOSIS — I2510 Atherosclerotic heart disease of native coronary artery without angina pectoris: Secondary | ICD-10-CM

## 2013-03-25 LAB — CBC WITH DIFFERENTIAL/PLATELET
Basophils Relative: 0.3 % (ref 0.0–3.0)
Hemoglobin: 12.4 g/dL (ref 12.0–15.0)
Lymphocytes Relative: 24.7 % (ref 12.0–46.0)
MCHC: 33.6 g/dL (ref 30.0–36.0)
Monocytes Relative: 6.8 % (ref 3.0–12.0)
Neutro Abs: 4.3 10*3/uL (ref 1.4–7.7)
RBC: 4.16 Mil/uL (ref 3.87–5.11)

## 2013-03-25 LAB — BASIC METABOLIC PANEL
CO2: 30 mEq/L (ref 19–32)
Calcium: 9.1 mg/dL (ref 8.4–10.5)
GFR: 62.92 mL/min (ref >60.00)
Potassium: 4 mEq/L (ref 3.5–5.1)
Sodium: 140 mEq/L (ref 135–145)

## 2013-03-25 NOTE — Progress Notes (Addendum)
HPI Patient is an 79 year old with a history of NICM (s/p CRT P) Repeat eval witn normal LV function. Also a history of HL and HTN.  I saw her in clnic in October SInce seen doing good  Breathing good  No Cp No dizziness.   Allergies  Allergen Reactions  . Aspirin     REACTION: stomach upset  . Atorvastatin     REACTION: muscle pain  . Celecoxib     REACTION: stomach upset  . Fenofibrate     REACTION: muscle pain  . Metformin     REACTION: diarrhea  . Moxifloxacin     REACTION: stomach upset  . Niacin     REACTION: muscle pain    Current Outpatient Prescriptions  Medication Sig Dispense Refill  . acetaminophen (TYLENOL) 325 MG tablet OTC as directed       . amLODipine (NORVASC) 5 MG tablet Take 1 tablet (5 mg total) by mouth 2 (two) times daily.  60 tablet  0  . BD ULTRA-FINE LANCETS lancets Use to check glucose once daily and in the evening       . carvedilol (COREG) 25 MG tablet take 1 tablet by mouth twice a day  60 tablet  6  . Cholecalciferol (VITAMIN D3) 1000 UNITS CAPS Take 1,000 Units by mouth daily.        Marland Kitchen dicyclomine (BENTYL) 10 MG capsule take 1 capsule by mouth three times a day  90 capsule  2  . dorzolamide-timolol (COSOPT) 22.3-6.8 MG/ML ophthalmic solution 1 drop 2 (two) times daily.      . furosemide (LASIX) 40 MG tablet 1tab 1 or 2 times per week  30 tablet  3  . gabapentin (NEURONTIN) 300 MG capsule Take 2 capsules (600 mg total) by mouth 2 (two) times daily.  120 capsule  5  . Glucose Blood (BL TEST STRIP PACK VI)       . glucose blood test strip Test two times a day as directed       . insulin lispro protamine-insulin lispro (HUMALOG MIX 75/25 PEN) (75-25) 100 UNIT/ML SUSP Inject 8 Units into the skin 2 (two) times daily with a meal.        . latanoprost (XALATAN) 0.005 % ophthalmic solution 1 drop at bedtime.      Marland Kitchen loperamide (LOPERAMIDE A-D) 2 MG tablet As needed       . NEXIUM 40 MG capsule take 1 capsule by mouth once daily  30 capsule  5  .  ramipril (ALTACE) 10 MG capsule Take 1 capsule (10 mg total) by mouth daily.  30 capsule  11  . simvastatin (ZOCOR) 20 MG tablet Take 1 tablet (20 mg total) by mouth daily.  30 tablet  11   No current facility-administered medications for this visit.    Past Medical History  Diagnosis Date  . Diabetes mellitus     Type II  . Hypertension   . Gout   . Allergy     allergic rhinitis  . CAD (coronary artery disease)     pulmonary hypertension// Cardiac cath 2001//2004 carotid dopplers Normal  . CHF (congestive heart failure)     non ish. cardiomyopathy// Dr. Tenny Craw cardiologist  . Cancer 2002    Renal cell cancer partial nephrectomy  . GERD (gastroesophageal reflux disease) 2003    EGD polyp; gastritis HH//Dr. Leone Payor GI  . Diverticula of intestine 2003    colonoscopy polyp; hem; divertic  . Cataract 2008  Right eye  . Personal history of kidney stones   . Hyperlipidemia     TRIG  . Osteopenia 2002    Dexa  . IBS (irritable bowel syndrome)   . Choledocholithiasis   . Transfusion history 1982    PUD/Bleed/Transfusion  . Osteoarthritis 07/12/2007/ 10/2004    spine and knees// LS Degen. changes   . Chronic kidney disease 05/2006    Renal failure ; hospitalized    Past Surgical History  Procedure Laterality Date  . Eye surgery  2008    Right eye cataract  . Cystoscopy w/ stone manipulation  2010- 2011    ERCPS and clearing of choledocholithiasis  . Ercp  2010  . Colonoscopy  09/2006    polyps  . Abdominal hysterectomy  1999    hysterectomy with bladder tack    Family History  Problem Relation Age of Onset  . Cancer Daughter     breast cancer  . Cancer Son 41    Esophageal Cancer  . Heart disease Daughter     History   Social History  . Marital Status: Widowed    Spouse Name: N/A    Number of Children: N/A  . Years of Education: N/A   Occupational History  . Not on file.   Social History Main Topics  . Smoking status: Never Smoker   . Smokeless  tobacco: Not on file  . Alcohol Use: No  . Drug Use: No  . Sexually Active:    Other Topics Concern  . Not on file   Social History Narrative  . No narrative on file    Review of Systems:  All systems reviewed.  They are negative to the above problem except as previously stated.  Vital Signs: BP 182/70  Pulse 53  Wt 142 lb (64.411 kg)  BMI 25.97 kg/m2  Physical Exam Patient is in NAD HEENT:  Normocephalic, atraumatic. EOMI, PERRLA.  Neck: JVP is normal.  No bruits.  Lungs: clear to auscultation. No rales no wheezes.  Heart: Regular rate and rhythm. Normal S1, S2. No S3.   No significant murmurs. PMI not displaced.  Abdomen:  Supple, nontender. Normal bowel sounds. No masses. No hepatomegaly.  Extremities:   Good distal pulses throughout. No lower extremity edema.  Musculoskeletal :moving all extremities.  Neuro:   alert and oriented x3.  CN II-XII grossly intact.  EKG  SB 53   Assessment and Plan:  1.  Cardiomyopathy.  Volume status is good  LV function has hormalized after BiV placed.  Keep on same regimen  2.  HTN  BP is high her but good at home  Follow  Bring cuff in fo next visit.

## 2013-03-25 NOTE — Patient Instructions (Addendum)
You will  Need lab work today:  Bmp, cbc   We will call you with your results  No changes were made to your medications today   Your physician recommends that you schedule a follow-up appointment in: 7 months with Dr. Tenny Craw

## 2013-05-06 ENCOUNTER — Other Ambulatory Visit: Payer: Self-pay | Admitting: *Deleted

## 2013-05-06 DIAGNOSIS — I10 Essential (primary) hypertension: Secondary | ICD-10-CM

## 2013-05-06 DIAGNOSIS — I2510 Atherosclerotic heart disease of native coronary artery without angina pectoris: Secondary | ICD-10-CM

## 2013-05-06 MED ORDER — AMLODIPINE BESYLATE 5 MG PO TABS: 5.0000 mg | ORAL_TABLET | Freq: Two times a day (BID) | ORAL | Status: DC

## 2013-05-25 ENCOUNTER — Ambulatory Visit (INDEPENDENT_AMBULATORY_CARE_PROVIDER_SITE_OTHER): Payer: Medicare Other | Admitting: Family Medicine

## 2013-05-25 DIAGNOSIS — Z23 Encounter for immunization: Secondary | ICD-10-CM

## 2013-06-14 ENCOUNTER — Other Ambulatory Visit: Payer: Self-pay | Admitting: Family Medicine

## 2013-06-14 NOTE — Telephone Encounter (Signed)
Schedule f/u with me in dec or jan and refill until then, thanks

## 2013-06-16 NOTE — Telephone Encounter (Signed)
Please schedule f/u with me in dec or Jan and refill until then

## 2013-06-17 ENCOUNTER — Encounter: Payer: Self-pay | Admitting: Internal Medicine

## 2013-06-17 ENCOUNTER — Ambulatory Visit (INDEPENDENT_AMBULATORY_CARE_PROVIDER_SITE_OTHER): Payer: Medicare Other | Admitting: Internal Medicine

## 2013-06-17 VITALS — BP 135/46 | HR 40 | Ht 62.0 in | Wt 141.0 lb

## 2013-06-17 DIAGNOSIS — I2510 Atherosclerotic heart disease of native coronary artery without angina pectoris: Secondary | ICD-10-CM

## 2013-06-17 DIAGNOSIS — Z950 Presence of cardiac pacemaker: Secondary | ICD-10-CM

## 2013-06-17 DIAGNOSIS — I442 Atrioventricular block, complete: Secondary | ICD-10-CM

## 2013-06-17 DIAGNOSIS — I429 Cardiomyopathy, unspecified: Secondary | ICD-10-CM

## 2013-06-17 DIAGNOSIS — R0609 Other forms of dyspnea: Secondary | ICD-10-CM

## 2013-06-17 LAB — PACEMAKER DEVICE OBSERVATION
ATRIAL PACING PM: 5
DEVICE MODEL PM: 100410
LV LEAD IMPEDENCE PM: 722 Ohm
RV LEAD THRESHOLD: 0.6 V
RV LEAD THRESHOLD: 4.5 V

## 2013-06-17 NOTE — Patient Instructions (Addendum)
Your physician wants you to follow-up in: one year with Dr. Graciela Husbands. You will receive a reminder letter in the mail two months in advance. If you don't receive a letter, please call our office to schedule the follow-up appointment.   Remote monitoring is used to monitor your Pacemaker of ICD from home. This monitoring reduces the number of office visits required to check your device to one time per year. It allows [place] to keep an eye on the functioning of your device to ensure it is working properly. You are scheduled for a device check from home on 09/22/2013. You may send your transmission at any time that day. If you have a wireless device, the transmission will be sent automatically. After your physician reviews your transmission, you will receive a postcard with your next transmission date.

## 2013-06-17 NOTE — Progress Notes (Signed)
Patient Care Team: Judy Pimple, MD as PCP - General   HPI  [name] is a 79 y.o. female  is seen in followup of NICM (now normal functionby echo September 2011), s/p CRT-D >>CRT-P      She is quite demented.   She has no complaints of her daughter notes that she is short of breath. There has been no edema. There've been no specific complaints of chest pain. s  Past Medical History  Diagnosis Date  . Diabetes mellitus     Type II  . Hypertension   . Gout   . Allergy     allergic rhinitis  . CAD (coronary artery disease)     pulmonary hypertension// Cardiac cath 2001//2004 carotid dopplers Normal  . CHF (congestive heart failure)     non ish. cardiomyopathy// Dr. Tenny Craw cardiologist  . Cancer 2002    Renal cell cancer partial nephrectomy  . GERD (gastroesophageal reflux disease) 2003    EGD polyp; gastritis HH//Dr. Leone Payor GI  . Diverticula of intestine 2003    colonoscopy polyp; hem; divertic  . Cataract 2008    Right eye  . Personal history of kidney stones   . Hyperlipidemia     TRIG  . Osteopenia 2002    Dexa  . IBS (irritable bowel syndrome)   . Choledocholithiasis   . Transfusion history 1982    PUD/Bleed/Transfusion  . Osteoarthritis 07/12/2007/ 10/2004    spine and knees// LS Degen. changes   . Chronic kidney disease 05/2006    Renal failure ; hospitalized    Past Surgical History  Procedure Laterality Date  . Eye surgery  2008    Right eye cataract  . Cystoscopy w/ stone manipulation  2010- 2011    ERCPS and clearing of choledocholithiasis  . Ercp  2010  . Colonoscopy  09/2006    polyps  . Abdominal hysterectomy  1999    hysterectomy with bladder tack    Current Outpatient Prescriptions  Medication Sig Dispense Refill  . acetaminophen (TYLENOL) 325 MG tablet OTC as directed       . amLODipine (NORVASC) 5 MG tablet Take 1 tablet (5 mg total) by mouth 2 (two) times daily.  60 tablet  6  . BD ULTRA-FINE LANCETS lancets Use to check glucose  once daily and in the evening       . carvedilol (COREG) 25 MG tablet take 1 tablet by mouth twice a day  60 tablet  6  . Cholecalciferol (VITAMIN D3) 1000 UNITS CAPS Take 1,000 Units by mouth daily.        Marland Kitchen dicyclomine (BENTYL) 10 MG capsule take 1 capsule by mouth three times a day  90 capsule  2  . furosemide (LASIX) 40 MG tablet 1tab 1 or 2 times per week  30 tablet  3  . gabapentin (NEURONTIN) 300 MG capsule take 2 capsules by mouth twice a day  120 capsule  2  . Glucose Blood (BL TEST STRIP PACK VI)       . glucose blood test strip Test two times a day as directed       . insulin lispro protamine-insulin lispro (HUMALOG MIX 75/25 PEN) (75-25) 100 UNIT/ML SUSP Inject 8 Units into the skin daily. 8 units in the AM and 6 units in the PM      . latanoprost (XALATAN) 0.005 % ophthalmic solution 1 drop at bedtime.      Marland Kitchen loperamide (LOPERAMIDE A-D) 2 MG tablet As needed       .  NEXIUM 40 MG capsule take 1 capsule by mouth once daily  30 capsule  5  . ramipril (ALTACE) 10 MG capsule Take 1 capsule (10 mg total) by mouth daily.  30 capsule  11  . simvastatin (ZOCOR) 20 MG tablet Take 1 tablet (20 mg total) by mouth daily.  30 tablet  11   No current facility-administered medications for this visit.    Allergies  Allergen Reactions  . Aspirin     REACTION: stomach upset  . Atorvastatin     REACTION: muscle pain  . Celecoxib     REACTION: stomach upset  . Fenofibrate     REACTION: muscle pain  . Metformin     REACTION: diarrhea  . Moxifloxacin     REACTION: stomach upset  . Niacin     REACTION: muscle pain    Review of Systems negative except from HPI and PMH  Physical Exam BP 135/46  Pulse 40  Ht 5\' 2"  (1.575 m)  Wt 141 lb (63.957 kg)  BMI 25.78 kg/m2 Well developed and well nourished in no acute distress HENT normal E scleral and icterus clear Neck Supple JVP flat; carotids brisk and full Clear to ausculation  Device pocket well healed; without hematoma or erythema.   There is no tethering regular rate and rhythm, no murmurs gallops or rub Soft with active bowel sounds No clubbing cyanosis none Edema Alert and oriented, grossly normal motor and sensory function Skin Warm and Dry    Assessment and  Plan

## 2013-06-17 NOTE — Assessment & Plan Note (Signed)
The patient's device was interrogated and the information was fully reviewed.  The device was reprogrammed As above  

## 2013-06-17 NOTE — Assessment & Plan Note (Signed)
The patient has dyspnea on exertion in the setting of previously abnormal and then subsequently normalized left ventricular function. She is noted to have significant bradycardia with most of her beats sensed in less than 70. We will reprogram the lower rate 60 activate rate response.

## 2013-06-17 NOTE — Assessment & Plan Note (Addendum)
Without chest pain. Is possible that her dyspnea is . an ischemic equivalent we will attempt treated as noted above.

## 2013-06-22 ENCOUNTER — Encounter: Payer: Self-pay | Admitting: Internal Medicine

## 2013-07-12 ENCOUNTER — Other Ambulatory Visit: Payer: Self-pay | Admitting: Family Medicine

## 2013-07-26 ENCOUNTER — Ambulatory Visit (INDEPENDENT_AMBULATORY_CARE_PROVIDER_SITE_OTHER): Payer: Medicare Other

## 2013-07-26 VITALS — BP 112/47 | HR 64 | Resp 28

## 2013-07-26 DIAGNOSIS — B351 Tinea unguium: Secondary | ICD-10-CM

## 2013-07-26 DIAGNOSIS — M79609 Pain in unspecified limb: Secondary | ICD-10-CM

## 2013-07-26 NOTE — Progress Notes (Signed)
  Subjective:    Patient ID: [name], female    DOB: 1924/06/17, 79 y.o.   MRN: [id number] "Trim her toenails," stated patient's daughter.  HPI patient presents for diabetic foot and nail care no changes medication her health history at this time.    Review of Systems no new findings or changes to     Objective:   Physical Exam Neurovascular status as follows DP pulses plus one over 4 bilateral PT pulses plus one over 4 bilateral epicritic sensation diminished on Semmes Weinstein testing to the forefoot and digits there is no edema no were no open wounds or ulcerations noted. Mild flexible digital contractures no new complicating problems are noted patient wearing coming shoes as instructed thick nails consistent with onychomycosis        Assessment & Plan:  Diabetes with peripheral neuropathy no new findings no new complaints. Thick brittle crumbly from criptotic nails are debrided x10 the presence of onychomycosis and diabetes and complicating factors. Patient stressed to maintain accommodative shoes at all times followup for diabetic foot and nail care in 3 months or as needed  Alvan Dame DPM

## 2013-07-26 NOTE — Patient Instructions (Signed)
Diabetes and Foot Care Diabetes may cause you to have problems because of poor blood supply (circulation) to your feet and legs. This may cause the skin on your feet to become thinner, break easier, and heal more slowly. Your skin may become dry, and the skin may peel and crack. You may also have nerve damage in your legs and feet causing decreased feeling in them. You may not notice minor injuries to your feet that could lead to infections or more serious problems. Taking care of your feet is one of the most important things you can do for yourself.  HOME CARE INSTRUCTIONS  Wear shoes at all times, even in the house. Do not go barefoot. Bare feet are easily injured.  Check your feet daily for blisters, cuts, and redness. If you cannot see the bottom of your feet, use a mirror or ask someone for help.  Wash your feet with warm water (do not use hot water) and mild soap. Then pat your feet and the areas between your toes until they are completely dry. Do not soak your feet as this can dry your skin.  Apply a moisturizing lotion or petroleum jelly (that does not contain alcohol and is unscented) to the skin on your feet and to dry, brittle toenails. Do not apply lotion between your toes.  Trim your toenails straight across. Do not dig under them or around the cuticle. File the edges of your nails with an emery board or nail file.  Do not cut corns or calluses or try to remove them with medicine.  Wear clean socks or stockings every day. Make sure they are not too tight. Do not wear knee-high stockings since they may decrease blood flow to your legs.  Wear shoes that fit properly and have enough cushioning. To break in new shoes, wear them for just a few hours a day. This prevents you from injuring your feet. Always look in your shoes before you put them on to be sure there are no objects inside.  Do not cross your legs. This may decrease the blood flow to your feet.  If you find a minor scrape,  cut, or break in the skin on your feet, keep it and the skin around it clean and dry. These areas may be cleansed with mild soap and water. Do not cleanse the area with peroxide, alcohol, or iodine.  When you remove an adhesive bandage, be sure not to damage the skin around it.  If you have a wound, look at it several times a day to make sure it is healing.  Do not use heating pads or hot water bottles. They may burn your skin. If you have lost feeling in your feet or legs, you may not know it is happening until it is too late.  Make sure your health care provider performs a complete foot exam at least annually or more often if you have foot problems. Report any cuts, sores, or bruises to your health care provider immediately. SEEK MEDICAL CARE IF:   You have an injury that is not healing.  You have cuts or breaks in the skin.  You have an ingrown nail.  You notice redness on your legs or feet.  You feel burning or tingling in your legs or feet.  You have pain or cramps in your legs and feet.  Your legs or feet are numb.  Your feet always feel cold. SEEK IMMEDIATE MEDICAL CARE IF:   There is increasing redness,   swelling, or pain in or around a wound.  There is a red line that goes up your leg.  Pus is coming from a wound.  You develop a fever or as directed by your health care provider.  You notice a bad smell coming from an ulcer or wound. Document Released: 08/29/2000 Document Revised: 05/04/2013 Document Reviewed: 02/08/2013 [employer] Patient Information 2014 [employer].  

## 2013-08-09 ENCOUNTER — Other Ambulatory Visit: Payer: Self-pay | Admitting: Family Medicine

## 2013-08-30 ENCOUNTER — Ambulatory Visit (INDEPENDENT_AMBULATORY_CARE_PROVIDER_SITE_OTHER): Payer: Medicare Other | Admitting: Family Medicine

## 2013-08-30 ENCOUNTER — Encounter: Payer: Self-pay | Admitting: Family Medicine

## 2013-08-30 VITALS — BP 124/64 | HR 60 | Temp 97.6°F | Ht 62.0 in | Wt 140.5 lb

## 2013-08-30 DIAGNOSIS — E559 Vitamin D deficiency, unspecified: Secondary | ICD-10-CM

## 2013-08-30 DIAGNOSIS — I10 Essential (primary) hypertension: Secondary | ICD-10-CM

## 2013-08-30 DIAGNOSIS — E785 Hyperlipidemia, unspecified: Secondary | ICD-10-CM

## 2013-08-30 NOTE — Progress Notes (Signed)
Subjective:    Patient ID: [name], female    DOB: 1924-08-20, 79 y.o.   MRN: [id number]  HPI  Here for f/u of chronic medical issues  Feeling good bp is ok and vitals are stable as is wt   She sees Dr Talmage Nap for her DM-- A1C was 6.0 last, in sept   (she knocked back her insulin)  Got a call from ins co re: needing a microalb - but she is on ace (altace) so is exempt  Due for lipid check  On simvastatin Lab Results  Component Value Date   CHOL 142 09/06/2012   HDL 33.40* 09/06/2012   LDLCALC 69 09/06/2012   LDLDIRECT 63.2 02/20/2011   TRIG 197.0* 09/06/2012   CHOLHDL 4 09/06/2012   due for annual labs   bp is stable today  No cp or palpitations or headaches or edema  No side effects to medicines  BP Readings from Last 3 Encounters:  08/30/13 124/64  07/26/13 112/47  06/17/13 135/46        Chemistry      Component Value Date/Time   NA 140 03/25/2013 1417   K 4.0 03/25/2013 1417   CL 103 03/25/2013 1417   CO2 30 03/25/2013 1417   BUN 19 03/25/2013 1417   CREATININE 0.9 03/25/2013 1417      Component Value Date/Time   CALCIUM 9.1 03/25/2013 1417   ALKPHOS 62 09/06/2012 0939   AST 14 09/06/2012 0939   ALT 9 09/06/2012 0939   BILITOT 0.6 09/06/2012 0939      Cataract surgery went well in June - doing well with that    Patient Active Problem List   Diagnosis Date Noted  . Dyspnea on exertion 06/17/2013  . Complete heart block 07/15/2012  . [employer] 07/07/2012  . Vitamin D deficiency disease 08/27/2011  . Spell of transient neurologic symptoms 06/20/2011  . External otitis 05/13/2011  . GERD (gastroesophageal reflux disease) 02/25/2011  . CARDIOMYOPATHY, SECONDARY 01/29/2009  . AV BLOCK, COMPLETE 01/29/2009  . IRRITABLE BOWEL SYNDROME 01/29/2009  . ANEURYSM, SPLENIC ARTERY 01/25/2009  . COLONIC POLYPS, ADENOMATOUS, HX OF 01/25/2009  . DIABETES MELLITUS, TYPE II 12/28/2006  . HYPERLIPIDEMIA 12/28/2006  . GOUT 12/28/2006  . HYPERTENSION  12/28/2006  . CORONARY ARTERY DISEASE 12/28/2006  . ALLERGIC RHINITIS 12/28/2006  . OSTEOARTHRITIS 12/28/2006  . OSTEOPENIA 12/28/2006   Past Medical History  Diagnosis Date  . Diabetes mellitus     Type II  . Hypertension   . Gout   . Allergy     allergic rhinitis  . CAD (coronary artery disease)     pulmonary hypertension// Cardiac cath 2001//2004 carotid dopplers Normal  . CHF (congestive heart failure)     non ish. cardiomyopathy// Dr. Tenny Craw cardiologist  . Cancer 2002    Renal cell cancer partial nephrectomy  . GERD (gastroesophageal reflux disease) 2003    EGD polyp; gastritis HH//Dr. Leone Payor GI  . Diverticula of intestine 2003    colonoscopy polyp; hem; divertic  . Cataract 2008    Right eye  . Personal history of kidney stones   . Hyperlipidemia     TRIG  . Osteopenia 2002    Dexa  . IBS (irritable bowel syndrome)   . Choledocholithiasis   . Transfusion history 1982    PUD/Bleed/Transfusion  . Osteoarthritis 07/12/2007/ 10/2004    spine and knees// LS Degen. changes   . Chronic kidney disease 05/2006    Renal failure ; hospitalized  Past Surgical History  Procedure Laterality Date  . Eye surgery  2008    Right eye cataract  . Cystoscopy w/ stone manipulation  2010- 2011    ERCPS and clearing of choledocholithiasis  . Ercp  2010  . Colonoscopy  09/2006    polyps  . Abdominal hysterectomy  1999    hysterectomy with bladder tack   History  Substance Use Topics  . Smoking status: Never Smoker   . Smokeless tobacco: Not on file  . Alcohol Use: No   Family History  Problem Relation Age of Onset  . Cancer Daughter     breast cancer  . Cancer Son 50    Esophageal Cancer  . Heart disease Daughter    Allergies  Allergen Reactions  . Aspirin     REACTION: stomach upset  . Atorvastatin     REACTION: muscle pain  . Celecoxib     REACTION: stomach upset  . Fenofibrate     REACTION: muscle pain  . Metformin     REACTION: diarrhea  . Moxifloxacin      REACTION: stomach upset  . Niacin     REACTION: muscle pain   Current Outpatient Prescriptions on File Prior to Visit  Medication Sig Dispense Refill  . acetaminophen (TYLENOL) 325 MG tablet OTC as directed       . amLODipine (NORVASC) 5 MG tablet Take 1 tablet (5 mg total) by mouth 2 (two) times daily.  60 tablet  6  . BD ULTRA-FINE LANCETS lancets Use to check glucose once daily and in the evening       . carvedilol (COREG) 25 MG tablet take 1 tablet by mouth twice a day  60 tablet  6  . Cholecalciferol (VITAMIN D3) 1000 UNITS CAPS Take 1,000 Units by mouth daily.        Marland Kitchen dicyclomine (BENTYL) 10 MG capsule take 1 capsule by mouth three times a day  90 capsule  2  . furosemide (LASIX) 40 MG tablet 1tab 1 or 2 times per week  30 tablet  3  . gabapentin (NEURONTIN) 300 MG capsule take 2 capsules by mouth twice a day  120 capsule  2  . Glucose Blood (BL TEST STRIP PACK VI)       . glucose blood test strip Test two times a day as directed       . insulin lispro protamine-insulin lispro (HUMALOG MIX 75/25 PEN) (75-25) 100 UNIT/ML SUSP Inject 8 Units into the skin daily. 8 units in the AM and 6 units in the PM      . latanoprost (XALATAN) 0.005 % ophthalmic solution 1 drop at bedtime.      Marland Kitchen loperamide (LOPERAMIDE A-D) 2 MG tablet As needed       . NEXIUM 40 MG capsule take 1 capsule by mouth once daily  30 capsule  2  . ramipril (ALTACE) 10 MG capsule take 1 capsule by mouth once daily  30 capsule  0  . simvastatin (ZOCOR) 20 MG tablet take 1 tablet by mouth once daily  30 tablet  0   No current facility-administered medications on file prior to visit.    Review of Systems Review of Systems  Constitutional: Negative for fever, appetite change, fatigue and unexpected weight change.  Eyes: Negative for pain and visual disturbance.  Respiratory: Negative for cough and shortness of breath.   Cardiovascular: Negative for cp or palpitations    Gastrointestinal: Negative for nausea, diarrhea  and constipation.  Genitourinary: Negative for urgency and frequency.  Skin: Negative for pallor or rash   MSK pos for aches and pains  Neurological: Negative for weakness, light-headedness, numbness and headaches.  Hematological: Negative for adenopathy. Does not bruise/bleed easily.  Psychiatric/Behavioral: Negative for dysphoric mood. The patient is nervous/anxious.at times, and per daughter some forgetfulness          Objective:   Physical Exam  Constitutional: She appears well-developed and well-nourished. No distress.  HENT:  Head: Normocephalic and atraumatic.  Right Ear: External ear normal.  Left Ear: External ear normal.  Nose: Nose normal.  Mouth/Throat: Oropharynx is clear and moist.  Eyes: Conjunctivae and EOM are normal. Pupils are equal, round, and reactive to light. Right eye exhibits no discharge. Left eye exhibits no discharge. No scleral icterus.  Neck: Normal range of motion. Neck supple. No JVD present. Carotid bruit is present. No thyromegaly present.  ? Faint carotid bruit on L   Cardiovascular: Normal rate, regular rhythm, normal heart sounds and intact distal pulses.  Exam reveals no gallop.   Pulmonary/Chest: Effort normal and breath sounds normal. No respiratory distress. She has no wheezes. She has no rales.  Abdominal: Soft. Bowel sounds are normal. She exhibits no distension and no mass. There is no tenderness.  Musculoskeletal: She exhibits no edema and no tenderness.  Lymphadenopathy:    She has no cervical adenopathy.  Neurological: She is alert. She has normal reflexes. No cranial nerve deficit. She exhibits normal muscle tone. Coordination normal.  Skin: Skin is warm and dry. No rash noted. No erythema. No pallor.  Psychiatric: She has a normal mood and affect.          Assessment & Plan:

## 2013-08-30 NOTE — Patient Instructions (Signed)
I'm glad you are doing so well  Labs today with cholesterol / kidney function and vit D level (yearly)  I'm glad you are doing well with diabetes-keep seeing Dr Talmage Nap Follow up in 6 months for annual exam with labs prior

## 2013-08-31 LAB — VITAMIN D 25 HYDROXY (VIT D DEFICIENCY, FRACTURES): Vit D, 25-Hydroxy: 51 ng/mL (ref 30–89)

## 2013-08-31 LAB — COMPREHENSIVE METABOLIC PANEL
ALT: 9 U/L (ref 0–35)
AST: 14 U/L (ref 0–37)
Calcium: 9 mg/dL (ref 8.4–10.5)
Chloride: 102 mEq/L (ref 96–112)
Creatinine, Ser: 1 mg/dL (ref 0.4–1.2)
Sodium: 139 mEq/L (ref 135–145)
Total Bilirubin: 0.5 mg/dL (ref 0.3–1.2)
Total Protein: 6.5 g/dL (ref 6.0–8.3)

## 2013-08-31 LAB — LIPID PANEL
HDL: 31.9 mg/dL — ABNORMAL LOW (ref >39.00)
VLDL: 67.4 mg/dL — ABNORMAL HIGH (ref 0.0–40.0)

## 2013-08-31 LAB — LDL CHOLESTEROL, DIRECT: Direct LDL: 79.3 mg/dL

## 2013-08-31 NOTE — Assessment & Plan Note (Signed)
BP: 124/64 mmHg   bp in fair control at this time  No changes needed Disc lifstyle change with low sodium diet and exercise  (the best she can at advanced age with safety) Lab today

## 2013-08-31 NOTE — Assessment & Plan Note (Signed)
D level today  No fractures Disc imp of D to bone and overall health

## 2013-08-31 NOTE — Assessment & Plan Note (Signed)
Disc goals for lipids and reasons to control them Has been controlled with simvastatin and diet  Rev labs with pt from a year ago  Rev low sat fat diet in detail  Lab today

## 2013-09-06 ENCOUNTER — Other Ambulatory Visit: Payer: Self-pay | Admitting: *Deleted

## 2013-09-06 NOTE — Telephone Encounter (Signed)
Please give her 32 with a refill , thanks

## 2013-09-06 NOTE — Telephone Encounter (Signed)
Ok to refill 

## 2013-09-07 MED ORDER — DICYCLOMINE HCL 10 MG PO CAPS: ORAL_CAPSULE | ORAL | Status: DC

## 2013-09-08 ENCOUNTER — Other Ambulatory Visit: Payer: Self-pay | Admitting: Family Medicine

## 2013-09-08 ENCOUNTER — Other Ambulatory Visit: Payer: Self-pay | Admitting: Internal Medicine

## 2013-09-12 ENCOUNTER — Telehealth: Payer: Self-pay | Admitting: Internal Medicine

## 2013-09-12 NOTE — Telephone Encounter (Signed)
Follow up   Pt's daughter calling about a light that would not go off.  Please give her a call back she has questions about this.

## 2013-09-12 NOTE — Telephone Encounter (Signed)
Spoke with daughter and # was given for pt to call tech services to see about troubleshooting/kwm

## 2013-09-19 ENCOUNTER — Ambulatory Visit (INDEPENDENT_AMBULATORY_CARE_PROVIDER_SITE_OTHER): Payer: Medicare Other | Admitting: Internal Medicine

## 2013-09-19 ENCOUNTER — Encounter: Payer: Self-pay | Admitting: Internal Medicine

## 2013-09-19 VITALS — BP 174/67 | HR 60 | Ht 62.0 in | Wt 139.0 lb

## 2013-09-19 DIAGNOSIS — I10 Essential (primary) hypertension: Secondary | ICD-10-CM

## 2013-09-19 DIAGNOSIS — I5022 Chronic systolic (congestive) heart failure: Secondary | ICD-10-CM

## 2013-09-19 DIAGNOSIS — I2510 Atherosclerotic heart disease of native coronary artery without angina pectoris: Secondary | ICD-10-CM

## 2013-09-19 DIAGNOSIS — I509 Heart failure, unspecified: Secondary | ICD-10-CM

## 2013-09-19 NOTE — Patient Instructions (Signed)
Your physician wants you to follow-up in: [place] will receive a reminder letter in the mail two months in advance. If you don't receive a letter, please call our office to schedule the follow-up appointment.

## 2013-09-19 NOTE — Progress Notes (Signed)
HPI Patient is an 80 year old with a history of NICM (s/p CRT P) Repeat eval witn normal LV function. Also a history of HL and HTN.  I saw her in cl[place] in July.  She has been seen by Olin Pia since.   SInce seen doing good  Breathing stable No Cp No dizziness.   Allergies  Allergen Reactions  . Aspirin     REACTION: stomach upset  . Atorvastatin     REACTION: muscle pain  . Celecoxib     REACTION: stomach upset  . Fenofibrate     REACTION: muscle pain  . Metformin     REACTION: diarrhea  . Moxifloxacin     REACTION: stomach upset  . Niacin     REACTION: muscle pain    Current Outpatient Prescriptions  Medication Sig Dispense Refill  . acetaminophen (TYLENOL) 325 MG tablet OTC as directed       . amLODipine (NORVASC) 5 MG tablet Take 1 tablet (5 mg total) by mouth 2 (two) times daily.  60 tablet  6  . BD ULTRA-FINE LANCETS lancets Use to check glucose once daily and in the evening       . carvedilol (COREG) 25 MG tablet take 1 tablet by mouth twice a day  60 tablet  0  . Cholecalciferol (VITAMIN D3) 1000 UNITS CAPS Take 1,000 Units by mouth daily.        Marland Kitchen dicyclomine (BENTYL) 10 MG capsule take 1 capsule by mouth three times a day  90 capsule  1  . dorzolamide-timolol (COSOPT) 22.3-6.8 MG/ML ophthalmic solution As directed.      . furosemide (LASIX) 40 MG tablet 1tab 1 or 2 times per week  30 tablet  3  . gabapentin (NEURONTIN) 300 MG capsule take 2 capsules by mouth twice a day  120 capsule  2  . Glucose Blood (BL TEST STRIP PACK VI)       . glucose blood test strip Test two times a day as directed       . insulin lispro protamine-insulin lispro (HUMALOG MIX 75/25 PEN) (75-25) 100 UNIT/ML SUSP Inject 8 Units into the skin daily. 8 units in the AM and 6 units in the PM      . latanoprost (XALATAN) 0.005 % ophthalmic solution 1 drop at bedtime.      Marland Kitchen loperamide (LOPERAMIDE A-D) 2 MG tablet As needed       . NEXIUM 40 MG capsule take 1 capsule by mouth once daily  30 capsule  2   . ramipril (ALTACE) 10 MG capsule take 1 capsule by mouth once daily  30 capsule  0  . simvastatin (ZOCOR) 20 MG tablet take 1 tablet by mouth once daily  30 tablet  0   No current facility-administered medications for this visit.    Past Medical History  Diagnosis Date  . Diabetes mellitus     Type II  . Hypertension   . Gout   . Allergy     allergic rhinitis  . CAD (coronary artery disease)     pulmonary hypertension// Cardiac cath 2001//2004 carotid dopplers Normal  . CHF (congestive heart failure)     non ish. cardiomyopathy// Dr. Harrington Challenger cardiologist  . Cancer 2002    Renal cell cancer partial nephrectomy  . GERD (gastroesophageal reflux disease) 2003    EGD polyp; gastritis HH//Dr. Carlean Purl GI  . Diverticula of intestine 2003    colonoscopy polyp; hem; divertic  . Cataract 2008  Right eye  . Personal history of kidney stones   . Hyperlipidemia     TRIG  . Osteopenia 2002    Dexa  . IBS (irritable bowel syndrome)   . Choledocholithiasis   . Transfusion history 1982    PUD/Bleed/Transfusion  . Osteoarthritis 07/12/2007/ 10/2004    spine and knees// LS Degen. changes   . Chronic kidney disease 05/2006    Renal failure ; hospitalized    Past Surgical History  Procedure Laterality Date  . Eye surgery  2008    Right eye cataract  . Cystoscopy w/ stone manipulation  2010- 2011    ERCPS and clearing of choledocholithiasis  . Ercp  2010  . Colonoscopy  09/2006    polyps  . Abdominal hysterectomy  1999    hysterectomy with bladder tack    Family History  Problem Relation Age of Onset  . Cancer Daughter     breast cancer  . Cancer Son 78    Esophageal Cancer  . Heart disease Daughter     History   Social History  . Marital Status: Widowed    Spouse Name: N/A    Number of Children: N/A  . Years of Education: N/A   Occupational History  . Not on file.   Social History Main Topics  . Smoking status: Never Smoker   . Smokeless tobacco: Not on file   . Alcohol Use: No  . Drug Use: No  . Sexual Activity: Not on file   Other Topics Concern  . Not on file   Social History Narrative  . No narrative on file    Review of Systems:  All systems reviewed.  They are negative to the above problem except as previously stated.  Vital Signs: BP 174/67  Pulse 60  Ht 5\' 2"  (1.575 m)  Wt 139 lb (63.05 kg)  BMI 25.42 kg/m2  Physical Exam Patient is in NAD HEENT:  Normocephalic, atraumatic. EOMI, PERRLA.  Neck: JVP is normal.  No bruits.  Lungs: clear to auscultation. No rales no wheezes.  Heart: Regular rate and rhythm. Normal S1, S2. No S3.   No significant murmurs. PMI not displaced.  Abdomen:  Supple, nontender. Normal bowel sounds. No masses. No hepatomegaly.  Extremities:   Good distal pulses throughout. No lower extremity edema.  Musculoskeletal :moving all extremities.  Neuro:   alert and oriented x3.  CN II-XII grossly intact.  EKG  SR 60  Dual chamber pacer.   Assessment and Plan:  1.  Cardiomyopathy.  Volume status is good  LV function has hormalized after BiV placed.  Keep on same regimen  2.  HTN  BP is high initially  Down to 150/60 on recheck  At home 120 to 140s.  Keep on same regimen  3.  HL  Trig high in 300s but otherwise LDL is good  Labs OK in [place]  F/U in July

## 2013-09-22 ENCOUNTER — Ambulatory Visit (INDEPENDENT_AMBULATORY_CARE_PROVIDER_SITE_OTHER): Payer: Medicare Other | Admitting: *Deleted

## 2013-09-22 DIAGNOSIS — I442 Atrioventricular block, complete: Secondary | ICD-10-CM

## 2013-09-22 DIAGNOSIS — I429 Cardiomyopathy, unspecified: Secondary | ICD-10-CM

## 2013-09-23 ENCOUNTER — Encounter: Payer: Self-pay | Admitting: Internal Medicine

## 2013-09-23 LAB — MDC_IDC_ENUM_SESS_TYPE_REMOTE
Lead Channel Impedance Value: 425 Ohm
Lead Channel Sensing Intrinsic Amplitude: 3.7 mV
Lead Channel Setting Pacing Amplitude: 2.4 V
Lead Channel Setting Pacing Amplitude: 4.5 V
Lead Channel Setting Pacing Pulse Width: 1 ms
MDC IDC MSMT LEADCHNL LV IMPEDANCE VALUE: 706 Ohm
MDC IDC MSMT LEADCHNL RV IMPEDANCE VALUE: 918 Ohm
MDC IDC PG SERIAL: 100410
MDC IDC SET LEADCHNL RA PACING AMPLITUDE: 2 V
MDC IDC SET LEADCHNL RV PACING PULSEWIDTH: 0.4 ms
MDC IDC SET LEADCHNL RV SENSING SENSITIVITY: 2.5 mV

## 2013-10-05 ENCOUNTER — Other Ambulatory Visit: Payer: Self-pay | Admitting: Family Medicine

## 2013-10-09 ENCOUNTER — Encounter: Payer: Self-pay | Admitting: Family Medicine

## 2013-10-09 ENCOUNTER — Other Ambulatory Visit: Payer: Self-pay | Admitting: Family Medicine

## 2013-10-10 NOTE — Telephone Encounter (Signed)
Med filled until follow up appointment in 6 months

## 2013-10-25 ENCOUNTER — Ambulatory Visit (INDEPENDENT_AMBULATORY_CARE_PROVIDER_SITE_OTHER): Payer: Medicare Other

## 2013-10-25 VITALS — BP 149/53 | HR 60 | Resp 16

## 2013-10-25 DIAGNOSIS — B351 Tinea unguium: Secondary | ICD-10-CM

## 2013-10-25 DIAGNOSIS — E1142 Type 2 diabetes mellitus with diabetic polyneuropathy: Secondary | ICD-10-CM

## 2013-10-25 DIAGNOSIS — E1140 Type 2 diabetes mellitus with diabetic neuropathy, unspecified: Secondary | ICD-10-CM

## 2013-10-25 DIAGNOSIS — E1149 Type 2 diabetes mellitus with other diabetic neurological complication: Secondary | ICD-10-CM

## 2013-10-25 DIAGNOSIS — M79609 Pain in unspecified limb: Secondary | ICD-10-CM

## 2013-10-25 NOTE — Progress Notes (Signed)
   Subjective:    Patient ID: [name], female    DOB: December 12, 1923, 80 y.o.   MRN: [id number]  HPI Comments: "Her MyChart said she needed a diabetic foot exam, so I wondered if she could get that done today and also her toenails need to be cut." (per pt's daughter)  Diabetes Associated symptoms include weakness.      Review of Systems  Eyes: Negative.   Respiratory: Negative.   Cardiovascular: Negative.   Gastrointestinal: Negative.   Endocrine: Negative.   Genitourinary: Negative.   Musculoskeletal: Positive for gait problem.  Skin: Negative.   Allergic/Immunologic: Negative.   Neurological: Positive for weakness and numbness.  Hematological: Negative.   Psychiatric/Behavioral: Negative.   All other systems reviewed and are negative.       Objective:   Physical Exam Vascular status is intact pedal pulses palpable DP plus one over 4 PT plus one over 4 bilateral these are diminished pulses temperature warm to cool capillary refill time 3 seconds all digits neurologically epicritic and proprioceptive sensations diminished confirmed on Semmes Weinstein testing to see diabetic foot exam and evaluation and sensory testing. Orthopedic biomechanical exam reveals mild digital contractures multiple diffuse keratoses noted however nails thick brittle criptotic incurvated friable particular hallux nails bilateral on debridement the left hallux nails treated with lumicain Neosporin no other new changes or findings no open wounds or ulcerations or identified at this time.       Assessment & Plan:  Assessment this time his diabetes with history peripheral neuropathy and history of complications no current wounds ulcerations or difficulties noted on debridement lumicain Neosporin applied to the left hallux nail plate mycotic nails thick brittle crumbly deformed nails painful tender symptomatically both on palpation with enclosed shoe or debridement in the presence of diabetes and complications  as well return for future palliative care every 3 months as needed probably annual diabetic foot and sensory exam is also recommended. Next  Harriet Masson DPM

## 2013-10-25 NOTE — Patient Instructions (Signed)
Diabetes and Foot Care Diabetes may cause you to have problems because of poor blood supply (circulation) to your feet and legs. This may cause the skin on your feet to become thinner, break easier, and heal more slowly. Your skin may become dry, and the skin may peel and crack. You may also have nerve damage in your legs and feet causing decreased feeling in them. You may not notice minor injuries to your feet that could lead to infections or more serious problems. Taking care of your feet is one of the most important things you can do for yourself.  HOME CARE INSTRUCTIONS  Wear shoes at all times, even in the house. Do not go barefoot. Bare feet are easily injured.  Check your feet daily for blisters, cuts, and redness. If you cannot see the bottom of your feet, use a mirror or ask someone for help.  Wash your feet with warm water (do not use hot water) and mild soap. Then pat your feet and the areas between your toes until they are completely dry. Do not soak your feet as this can dry your skin.  Apply a moisturizing lotion or petroleum jelly (that does not contain alcohol and is unscented) to the skin on your feet and to dry, brittle toenails. Do not apply lotion between your toes.  Trim your toenails straight across. Do not dig under them or around the cuticle. File the edges of your nails with an emery board or nail file.  Do not cut corns or calluses or try to remove them with medicine.  Wear clean socks or stockings every day. Make sure they are not too tight. Do not wear knee-high stockings since they may decrease blood flow to your legs.  Wear shoes that fit properly and have enough cushioning. To break in new shoes, wear them for just a few hours a day. This prevents you from injuring your feet. Always look in your shoes before you put them on to be sure there are no objects inside.  Do not cross your legs. This may decrease the blood flow to your feet.  If you find a minor scrape,  cut, or break in the skin on your feet, keep it and the skin around it clean and dry. These areas may be cleansed with mild soap and water. Do not cleanse the area with peroxide, alcohol, or iodine.  When you remove an adhesive bandage, be sure not to damage the skin around it.  If you have a wound, look at it several times a day to make sure it is healing.  Do not use heating pads or hot water bottles. They may burn your skin. If you have lost feeling in your feet or legs, you may not know it is happening until it is too late.  Make sure your health care provider performs a complete foot exam at least annually or more often if you have foot problems. Report any cuts, sores, or bruises to your health care provider immediately. SEEK MEDICAL CARE IF:   You have an injury that is not healing.  You have cuts or breaks in the skin.  You have an ingrown nail.  You notice redness on your legs or feet.  You feel burning or tingling in your legs or feet.  You have pain or cramps in your legs and feet.  Your legs or feet are numb.  Your feet always feel cold. SEEK IMMEDIATE MEDICAL CARE IF:   There is increasing redness,   swelling, or pain in or around a wound.  There is a red line that goes up your leg.  Pus is coming from a wound.  You develop a fever or as directed by your health care provider.  You notice a bad smell coming from an ulcer or wound. Document Released: 08/29/2000 Document Revised: 05/04/2013 Document Reviewed: 02/08/2013 [employer] Patient Information 2014 [employer].  

## 2013-11-06 ENCOUNTER — Other Ambulatory Visit: Payer: Self-pay | Admitting: Internal Medicine

## 2013-11-07 MED ORDER — CARVEDILOL 25 MG PO TABS: 25.0000 mg | ORAL_TABLET | Freq: Two times a day (BID) | ORAL | Status: DC

## 2013-11-30 ENCOUNTER — Other Ambulatory Visit: Payer: Self-pay | Admitting: Family Medicine

## 2013-11-30 NOTE — Telephone Encounter (Signed)
Will refill electronically  

## 2013-12-01 ENCOUNTER — Other Ambulatory Visit: Payer: Self-pay | Admitting: Family Medicine

## 2013-12-05 ENCOUNTER — Other Ambulatory Visit: Payer: Self-pay | Admitting: *Deleted

## 2013-12-05 DIAGNOSIS — I10 Essential (primary) hypertension: Secondary | ICD-10-CM

## 2013-12-05 DIAGNOSIS — I2510 Atherosclerotic heart disease of native coronary artery without angina pectoris: Secondary | ICD-10-CM

## 2013-12-05 MED ORDER — AMLODIPINE BESYLATE 5 MG PO TABS: 5.0000 mg | ORAL_TABLET | Freq: Two times a day (BID) | ORAL | Status: DC

## 2013-12-29 ENCOUNTER — Ambulatory Visit (INDEPENDENT_AMBULATORY_CARE_PROVIDER_SITE_OTHER): Payer: Medicare Other | Admitting: *Deleted

## 2013-12-29 ENCOUNTER — Encounter: Payer: Self-pay | Admitting: Internal Medicine

## 2013-12-29 DIAGNOSIS — I429 Cardiomyopathy, unspecified: Secondary | ICD-10-CM

## 2013-12-29 DIAGNOSIS — I442 Atrioventricular block, complete: Secondary | ICD-10-CM

## 2014-01-06 LAB — MDC_IDC_ENUM_SESS_TYPE_REMOTE
Brady Statistic RA Percent Paced: 80 %
Brady Statistic RV Percent Paced: 100 %
Lead Channel Impedance Value: 420 Ohm
Lead Channel Impedance Value: 903 Ohm
Lead Channel Setting Pacing Amplitude: 2 V
Lead Channel Setting Pacing Amplitude: 4.5 V
Lead Channel Setting Pacing Pulse Width: 0.4 ms
Lead Channel Setting Pacing Pulse Width: 1 ms
MDC IDC MSMT LEADCHNL LV IMPEDANCE VALUE: 669 Ohm
MDC IDC MSMT LEADCHNL RA SENSING INTR AMPL: 3.3 mV
MDC IDC PG SERIAL: 100410
MDC IDC SET LEADCHNL RV PACING AMPLITUDE: 2.4 V
MDC IDC SET LEADCHNL RV SENSING SENSITIVITY: 2.5 mV

## 2014-01-12 ENCOUNTER — Encounter: Payer: Self-pay | Admitting: Internal Medicine

## 2014-01-12 ENCOUNTER — Ambulatory Visit (INDEPENDENT_AMBULATORY_CARE_PROVIDER_SITE_OTHER): Payer: Medicare Other | Admitting: Internal Medicine

## 2014-01-12 VITALS — BP 112/54 | HR 60 | Temp 97.9°F | Ht 60.5 in | Wt 137.5 lb

## 2014-01-12 DIAGNOSIS — Z0000 Encounter for general adult medical examination without abnormal findings: Secondary | ICD-10-CM

## 2014-01-12 NOTE — Progress Notes (Signed)
Pre visit review using our clinic review tool, if applicable. No additional management support is needed unless otherwise documented below in the visit note. 

## 2014-01-12 NOTE — Patient Instructions (Addendum)

## 2014-01-12 NOTE — Progress Notes (Signed)
HPI:  Pt presents to the clinic for her Medicare wellness exam. She has no concerns today.  Past Medical History  Diagnosis Date  . Diabetes mellitus     Type II  . Hypertension   . Gout   . Allergy     allergic rhinitis  . CAD (coronary artery disease)     pulmonary hypertension// Cardiac cath 2001//2004 carotid dopplers Normal  . CHF (congestive heart failure)     non ish. cardiomyopathy// Dr. Harrington Challenger cardiologist  . Cancer 2002    Renal cell cancer partial nephrectomy  . GERD (gastroesophageal reflux disease) 2003    EGD polyp; gastritis HH//Dr. Carlean Purl GI  . Diverticula of intestine 2003    colonoscopy polyp; hem; divertic  . Cataract 2008    Right eye  . Personal history of kidney stones   . Hyperlipidemia     TRIG  . Osteopenia 2002    Dexa  . IBS (irritable bowel syndrome)   . Choledocholithiasis   . Transfusion history 1982    PUD/Bleed/Transfusion  . Osteoarthritis 07/12/2007/ 10/2004    spine and knees// LS Degen. changes   . Chronic kidney disease 05/2006    Renal failure ; hospitalized    Current Outpatient Prescriptions  Medication Sig Dispense Refill  . acetaminophen (TYLENOL) 325 MG tablet OTC as directed       . amLODipine (NORVASC) 5 MG tablet Take 1 tablet (5 mg total) by mouth 2 (two) times daily.  60 tablet  3  . BD ULTRA-FINE LANCETS lancets Use to check glucose once daily and in the evening       . carvedilol (COREG) 25 MG tablet Take 1 tablet (25 mg total) by mouth 2 (two) times daily with a meal.  60 tablet  11  . Cholecalciferol (VITAMIN D3) 1000 UNITS CAPS Take 1,000 Units by mouth daily.        Marland Kitchen dicyclomine (BENTYL) 10 MG capsule take 1 capsule by mouth three times a day  90 capsule  1  . dorzolamide-timolol (COSOPT) 22.3-6.8 MG/ML ophthalmic solution Place 1 drop into both eyes 2 (two) times daily. As directed.      . furosemide (LASIX) 40 MG tablet 1tab 1 or 2 times per week  30 tablet  3  . gabapentin (NEURONTIN) 300 MG capsule take 2  capsules by mouth twice a day  120 capsule  3  . Glucose Blood (BL TEST STRIP PACK VI)       . glucose blood test strip Test two times a day as directed       . insulin lispro protamine-insulin lispro (HUMALOG MIX 75/25 PEN) (75-25) 100 UNIT/ML SUSP Inject 8 Units into the skin daily. 8 units in the AM and 6 units in the PM      . latanoprost (XALATAN) 0.005 % ophthalmic solution 1 drop at bedtime.      Marland Kitchen loperamide (LOPERAMIDE A-D) 2 MG tablet As needed       . NEXIUM 40 MG capsule take 1 capsule by mouth once daily  30 capsule  5  . ramipril (ALTACE) 10 MG capsule take 1 capsule by mouth once daily  30 capsule  5  . simvastatin (ZOCOR) 20 MG tablet take 1 tablet by mouth once daily  30 tablet  5   No current facility-administered medications for this visit.    Allergies  Allergen Reactions  . Aspirin     REACTION: stomach upset  . Atorvastatin     REACTION: muscle  pain  . Celecoxib     REACTION: stomach upset  . Fenofibrate     REACTION: muscle pain  . Metformin     REACTION: diarrhea  . Moxifloxacin     REACTION: stomach upset  . Niacin     REACTION: muscle pain    Family History  Problem Relation Age of Onset  . Cancer Daughter     breast cancer  . Cancer Son 28    Esophageal Cancer  . Heart disease Daughter     History   Social History  . Marital Status: Widowed    Spouse Name: N/A    Number of Children: N/A  . Years of Education: N/A   Occupational History  . Not on file.   Social History Main Topics  . Smoking status: Never Smoker   . Smokeless tobacco: Not on file  . Alcohol Use: No  . Drug Use: No  . Sexual Activity: Not on file   Other Topics Concern  . Not on file   Social History Narrative  . No narrative on file    Hospitiliaztions: None  Health Maintenance:    Flu: 05/2013  Tetanus: 2013  Pneumovax: 2000  Zostavax: 2012  Mammogram: no longer screening  Pap Smear: no longer screening  Bone Density: 2007  Colonoscopy: 2011  Eye  Doctor: yearly  Dental Exam: biannually    I have personally reviewed and have noted:  1. The patient's medical and social history 2. Their use of alcohol, tobacco or illicit drugs 3. Their current medications and supplements 4. The patient's functional ability including ADL's, fall  risks, home safety risks and  hearing or visual i mpairment. 5. Diet and physical activities 6. Evidence for depression or mood disorder   Objective:  PE:   BP 112/54  Pulse 60  Temp(Src) 97.9 F (36.6 C) (Oral)  Ht 5' 0.5" (1.537 m)  Wt 137 lb 8 oz (62.37 kg)  BMI 26.40 kg/m2  SpO2 96% Wt Readings from Last 3 Encounters:  01/12/14 137 lb 8 oz (62.37 kg)  09/19/13 139 lb (63.05 kg)  08/30/13 140 lb 8 oz (63.73 kg)     BMET    Component Value Date/Time   NA 139 08/30/2013 1229   K 4.1 08/30/2013 1229   CL 102 08/30/2013 1229   CO2 30 08/30/2013 1229   GLUCOSE 148* 08/30/2013 1229   BUN 22 08/30/2013 1229   CREATININE 1.0 08/30/2013 1229   CALCIUM 9.0 08/30/2013 1229   GFRNONAA 66.72 08/26/2010 1224   GFRAA  Value: >60        The eGFR has been calculated using the MDRD equation. This calculation has not been validated in all clinical situations. eGFR's persistently <60 mL/min signify possible Chronic Kidney Disease. 10/04/2009 0520    Lipid Panel     Component Value Date/Time   CHOL 141 08/30/2013 1229   TRIG 337.0* 08/30/2013 1229   HDL 31.90* 08/30/2013 1229   CHOLHDL 4 08/30/2013 1229   VLDL 67.4* 08/30/2013 1229   LDLCALC 69 09/06/2012 0939    CBC    Component Value Date/Time   WBC 6.4 03/25/2013 1417   RBC 4.16 03/25/2013 1417   HGB 12.4 03/25/2013 1417   HCT 36.8 03/25/2013 1417   PLT 186.0 03/25/2013 1417   MCV 88.6 03/25/2013 1417   MCHC 33.6 03/25/2013 1417   RDW 14.4 03/25/2013 1417   LYMPHSABS 1.6 03/25/2013 1417   MONOABS 0.4 03/25/2013 1417   EOSABS 0.1 03/25/2013 1417  BASOSABS 0.0 03/25/2013 1417    Hgb A1C Lab Results  Component Value Date   HGBA1C 8.2*  02/26/2009      Assessment and Plan:   Medicare Annual Wellness Visit:  Diet: low salt, low fat, mechanical soft food Physical activity: Sedentary Depression/mood screen: Negative Hearing: Intact to whispered voice Visual acuity: Grossly normal, performs annual eye exam  ADLs: Capable wit h use of rollator Fall risk: Moderate risk, uses rollator Home safety: Good Cognitive evaluation: Intact to orientation, naming, recall and repetition EOL planning: Living will, DNR/ I agree  Preventative Medicine:  None today  Next appointment:  June 2015 with Dr. Glori Bickers

## 2014-01-18 ENCOUNTER — Encounter: Payer: Self-pay | Admitting: Cardiology

## 2014-01-24 ENCOUNTER — Ambulatory Visit (INDEPENDENT_AMBULATORY_CARE_PROVIDER_SITE_OTHER): Payer: Medicare Other

## 2014-01-24 VITALS — BP 143/73 | HR 60 | Resp 17 | Ht 63.0 in | Wt 137.0 lb

## 2014-01-24 DIAGNOSIS — E1142 Type 2 diabetes mellitus with diabetic polyneuropathy: Secondary | ICD-10-CM

## 2014-01-24 DIAGNOSIS — E1140 Type 2 diabetes mellitus with diabetic neuropathy, unspecified: Secondary | ICD-10-CM

## 2014-01-24 DIAGNOSIS — E1149 Type 2 diabetes mellitus with other diabetic neurological complication: Secondary | ICD-10-CM

## 2014-01-24 DIAGNOSIS — M79609 Pain in unspecified limb: Secondary | ICD-10-CM

## 2014-01-24 DIAGNOSIS — B351 Tinea unguium: Secondary | ICD-10-CM

## 2014-01-24 NOTE — Patient Instructions (Signed)
Diabetes and Foot Care Diabetes may cause you to have problems because of poor blood supply (circulation) to your feet and legs. This may cause the skin on your feet to become thinner, break easier, and heal more slowly. Your skin may become dry, and the skin may peel and crack. You may also have nerve damage in your legs and feet causing decreased feeling in them. You may not notice minor injuries to your feet that could lead to infections or more serious problems. Taking care of your feet is one of the most important things you can do for yourself.  HOME CARE INSTRUCTIONS  Wear shoes at all times, even in the house. Do not go barefoot. Bare feet are easily injured.  Check your feet daily for blisters, cuts, and redness. If you cannot see the bottom of your feet, use a mirror or ask someone for help.  Wash your feet with warm water (do not use hot water) and mild soap. Then pat your feet and the areas between your toes until they are completely dry. Do not soak your feet as this can dry your skin.  Apply a moisturizing lotion or petroleum jelly (that does not contain alcohol and is unscented) to the skin on your feet and to dry, brittle toenails. Do not apply lotion between your toes.  Trim your toenails straight across. Do not dig under them or around the cuticle. File the edges of your nails with an emery board or nail file.  Do not cut corns or calluses or try to remove them with medicine.  Wear clean socks or stockings every day. Make sure they are not too tight. Do not wear knee-high stockings since they may decrease blood flow to your legs.  Wear shoes that fit properly and have enough cushioning. To break in new shoes, wear them for just a few hours a day. This prevents you from injuring your feet. Always look in your shoes before you put them on to be sure there are no objects inside.  Do not cross your legs. This may decrease the blood flow to your feet.  If you find a minor scrape,  cut, or break in the skin on your feet, keep it and the skin around it clean and dry. These areas may be cleansed with mild soap and water. Do not cleanse the area with peroxide, alcohol, or iodine.  When you remove an adhesive bandage, be sure not to damage the skin around it.  If you have a wound, look at it several times a day to make sure it is healing.  Do not use heating pads or hot water bottles. They may burn your skin. If you have lost feeling in your feet or legs, you may not know it is happening until it is too late.  Make sure your health care provider performs a complete foot exam at least annually or more often if you have foot problems. Report any cuts, sores, or bruises to your health care provider immediately. SEEK MEDICAL CARE IF:   You have an injury that is not healing.  You have cuts or breaks in the skin.  You have an ingrown nail.  You notice redness on your legs or feet.  You feel burning or tingling in your legs or feet.  You have pain or cramps in your legs and feet.  Your legs or feet are numb.  Your feet always feel cold. SEEK IMMEDIATE MEDICAL CARE IF:   There is increasing redness,   swelling, or pain in or around a wound.  There is a red line that goes up your leg.  Pus is coming from a wound.  You develop a fever or as directed by your health care provider.  You notice a bad smell coming from an ulcer or wound. Document Released: 08/29/2000 Document Revised: 05/04/2013 Document Reviewed: 02/08/2013 [employer] Patient Information 2014 [employer].  

## 2014-01-24 NOTE — Progress Notes (Signed)
   Subjective:    Patient ID: [name], female    DOB: [birth year], 80 y.o.   MRN: [id number]  HPI Comments: Pt states she here for debridement, and diabetic foot exam and her A1C is 6.0.     Review of Systems no new systemic changes or findings     Objective:   Physical Exam Neurovascular status is intact pedal pulses palpable DP and PT plus one over 4 bilateral capillary refill time 3 seconds all digits epicritic sensations diminished on Semmes Weinstein testing to the digits and plantar forefoot and arch orthopedic biomechanical exam ill semirigid digital contractures diffuse keratoses no open wounds ulcerations nails thick brittle criptotic incurvated friable 1 through 5 bilateral no other new changes no open wounds ulcerations no secondary infections       Assessment & Plan:  Assessment this time his diabetes with peripheral neuropathy and complications debridement of painful mycotic dystrophic brittle nails 1 through 5 bilateral the presence of diabetes and complications and pain in symptomology return for palliative care every 3 months as recommended  Harriet Masson DPM

## 2014-02-09 ENCOUNTER — Other Ambulatory Visit: Payer: Self-pay | Admitting: Family Medicine

## 2014-02-22 ENCOUNTER — Other Ambulatory Visit: Payer: Medicare Other

## 2014-03-01 ENCOUNTER — Encounter: Payer: Medicare Other | Admitting: Family Medicine

## 2014-03-04 ENCOUNTER — Telehealth: Payer: Self-pay | Admitting: Family Medicine

## 2014-03-04 DIAGNOSIS — M949 Disorder of cartilage, unspecified: Secondary | ICD-10-CM

## 2014-03-04 DIAGNOSIS — E559 Vitamin D deficiency, unspecified: Secondary | ICD-10-CM

## 2014-03-04 DIAGNOSIS — E785 Hyperlipidemia, unspecified: Secondary | ICD-10-CM

## 2014-03-04 DIAGNOSIS — M899 Disorder of bone, unspecified: Secondary | ICD-10-CM

## 2014-03-04 DIAGNOSIS — I10 Essential (primary) hypertension: Secondary | ICD-10-CM

## 2014-03-04 NOTE — Telephone Encounter (Signed)
Message copied by Abner Greenspan on Sat Mar 04, 2014  5:23 PM ------      Message from: Ellamae Sia      Created: Tue Feb 28, 2014 12:58 PM      Regarding: Lab orders for Tuesday, 6.23.15       Patient is scheduled for CPX labs, please order future labs, Thanks , Terri       ------

## 2014-03-07 ENCOUNTER — Other Ambulatory Visit (INDEPENDENT_AMBULATORY_CARE_PROVIDER_SITE_OTHER): Payer: Medicare Other

## 2014-03-07 DIAGNOSIS — M899 Disorder of bone, unspecified: Secondary | ICD-10-CM

## 2014-03-07 DIAGNOSIS — E119 Type 2 diabetes mellitus without complications: Secondary | ICD-10-CM

## 2014-03-07 DIAGNOSIS — I10 Essential (primary) hypertension: Secondary | ICD-10-CM

## 2014-03-07 DIAGNOSIS — E785 Hyperlipidemia, unspecified: Secondary | ICD-10-CM

## 2014-03-07 DIAGNOSIS — M949 Disorder of cartilage, unspecified: Secondary | ICD-10-CM

## 2014-03-07 DIAGNOSIS — E559 Vitamin D deficiency, unspecified: Secondary | ICD-10-CM

## 2014-03-07 LAB — CBC WITH DIFFERENTIAL/PLATELET
BASOS PCT: 0.5 % (ref 0.0–3.0)
Basophils Absolute: 0 10*3/uL (ref 0.0–0.1)
Eosinophils Absolute: 0.1 10*3/uL (ref 0.0–0.7)
Eosinophils Relative: 1.4 % (ref 0.0–5.0)
HEMATOCRIT: 35.9 % — AB (ref 36.0–46.0)
HEMOGLOBIN: 11.8 g/dL — AB (ref 12.0–15.0)
Lymphocytes Relative: 23.5 % (ref 12.0–46.0)
Lymphs Abs: 1.4 10*3/uL (ref 0.7–4.0)
MCHC: 32.8 g/dL (ref 30.0–36.0)
MCV: 87.8 fl (ref 78.0–100.0)
Monocytes Absolute: 0.4 10*3/uL (ref 0.1–1.0)
Monocytes Relative: 6.5 % (ref 3.0–12.0)
NEUTROS ABS: 4.1 10*3/uL (ref 1.4–7.7)
Neutrophils Relative %: 68.1 % (ref 43.0–77.0)
Platelets: 169 10*3/uL (ref 150.0–400.0)
RBC: 4.09 Mil/uL (ref 3.87–5.11)
RDW: 14.3 % (ref 11.5–15.5)
WBC: 6 10*3/uL (ref 4.0–10.5)

## 2014-03-07 LAB — COMPREHENSIVE METABOLIC PANEL
ALT: 8 U/L (ref 0–35)
AST: 14 U/L (ref 0–37)
Albumin: 3.8 g/dL (ref 3.5–5.2)
Alkaline Phosphatase: 68 U/L (ref 39–117)
BILIRUBIN TOTAL: 0.5 mg/dL (ref 0.2–1.2)
BUN: 21 mg/dL (ref 6–23)
CO2: 31 meq/L (ref 19–32)
CREATININE: 0.8 mg/dL (ref 0.4–1.2)
Calcium: 9.3 mg/dL (ref 8.4–10.5)
Chloride: 107 mEq/L (ref 96–112)
GFR: 71.93 mL/min (ref >60.00)
GLUCOSE: 124 mg/dL — AB (ref 70–99)
Potassium: 3.9 mEq/L (ref 3.5–5.1)
Sodium: 143 mEq/L (ref 135–145)
TOTAL PROTEIN: 6.8 g/dL (ref 6.0–8.3)

## 2014-03-07 LAB — LIPID PANEL
CHOLESTEROL: 126 mg/dL (ref 0–200)
HDL: 36.8 mg/dL — ABNORMAL LOW (ref >39.00)
LDL Cholesterol: 63 mg/dL (ref 0–99)
NonHDL: 89.2
Total CHOL/HDL Ratio: 3
Triglycerides: 130 mg/dL (ref 0.0–149.0)
VLDL: 26 mg/dL (ref 0.0–40.0)

## 2014-03-07 LAB — TSH: TSH: 0.38 u[IU]/mL (ref 0.35–4.50)

## 2014-03-07 LAB — VITAMIN D 25 HYDROXY (VIT D DEFICIENCY, FRACTURES): VITD: 48.18 ng/mL

## 2014-03-14 ENCOUNTER — Encounter: Payer: Self-pay | Admitting: Family Medicine

## 2014-03-14 ENCOUNTER — Ambulatory Visit (INDEPENDENT_AMBULATORY_CARE_PROVIDER_SITE_OTHER): Payer: Medicare Other | Admitting: Family Medicine

## 2014-03-14 VITALS — BP 124/56 | HR 60 | Temp 98.8°F | Ht 60.5 in | Wt 133.0 lb

## 2014-03-14 DIAGNOSIS — M949 Disorder of cartilage, unspecified: Secondary | ICD-10-CM

## 2014-03-14 DIAGNOSIS — Z23 Encounter for immunization: Secondary | ICD-10-CM

## 2014-03-14 DIAGNOSIS — M899 Disorder of bone, unspecified: Secondary | ICD-10-CM

## 2014-03-14 DIAGNOSIS — I10 Essential (primary) hypertension: Secondary | ICD-10-CM

## 2014-03-14 DIAGNOSIS — E785 Hyperlipidemia, unspecified: Secondary | ICD-10-CM

## 2014-03-14 DIAGNOSIS — E559 Vitamin D deficiency, unspecified: Secondary | ICD-10-CM

## 2014-03-14 MED ORDER — RAMIPRIL 10 MG PO CAPS: ORAL_CAPSULE | ORAL | Status: DC

## 2014-03-14 MED ORDER — DICYCLOMINE HCL 10 MG PO CAPS: ORAL_CAPSULE | ORAL | Status: DC

## 2014-03-14 MED ORDER — SIMVASTATIN 20 MG PO TABS: ORAL_TABLET | ORAL | Status: DC

## 2014-03-14 MED ORDER — ESOMEPRAZOLE MAGNESIUM 40 MG PO CPDR: DELAYED_RELEASE_CAPSULE | ORAL | Status: DC

## 2014-03-14 MED ORDER — GABAPENTIN 300 MG PO CAPS: ORAL_CAPSULE | ORAL | Status: DC

## 2014-03-14 NOTE — Progress Notes (Signed)
Subjective:    Patient ID: [name], female    DOB: Jun 26, 1924, 80 y.o.   MRN: [id number]  HPI Here for f/u of chronic medical problems  Pt states she is doing really well  Enjoys her life at home - has animals and lots of family  Has another great grand daughter born recently in May and she is close by   [facility] is down 4 lb with bmi of 25 Still has a lot of trouble with diarrhea intermittently - so wt goes up and down day to day  She eats what she can and sees GI - Dr Carlean Purl (she takes loperimide and bentyl when needed)   Had pneumovax Has not had prevnar - will do that today  bp is stable today  No cp or palpitations or headaches or edema  No side effects to medicines  BP Readings from Last 3 Encounters:  03/14/14 124/56  01/24/14 143/73  01/12/14 112/54     No change in meds   Sees endo for DM- Dr Chalmers Cater and her control has been excellent   Cataract surgery a year ago  1/15 had opthy - good visit/ goes again in July- for glaucoma   Osteopenia  No falls or broken bones at all  D level is good at 67 Likes to walk with walker on level surfaces  Is interested in dexa - it has been a while    Hyperlipidemia Lab Results  Component Value Date   CHOL 126 03/07/2014   CHOL 141 08/30/2013   CHOL 142 09/06/2012   Lab Results  Component Value Date   HDL 36.80* 03/07/2014   HDL 31.90* 08/30/2013   HDL 33.40* 09/06/2012   Lab Results  Component Value Date   LDLCALC 63 03/07/2014   LDLCALC 69 09/06/2012   LDLCALC 84 08/25/2011   Lab Results  Component Value Date   TRIG 130.0 03/07/2014   TRIG 337.0* 08/30/2013   TRIG 197.0* 09/06/2012   Lab Results  Component Value Date   CHOLHDL 3 03/07/2014   CHOLHDL 4 08/30/2013   CHOLHDL 4 09/06/2012   Lab Results  Component Value Date   LDLDIRECT 79.3 08/30/2013   LDLDIRECT 63.2 02/20/2011   LDLDIRECT 75.3 08/26/2010   on simvastatin and diet  Tries to stay very active   Anemia -  Lab Results  Component Value Date   WBC 6.0 03/07/2014   HGB 11.8* 03/07/2014   HCT 35.9* 03/07/2014   MCV 87.8 03/07/2014   PLT 169.0 03/07/2014   this is not uncommon - occ hemorrhoids bleed a bit  Her Hb goes up and down  Tries to get iron in diet (is limited by lack of teeth)    Patient Active Problem List   Diagnosis Date Noted  . Dyspnea on exertion 06/17/2013  . Complete heart block 07/15/2012  . [employer] 07/07/2012  . Vitamin D deficiency disease 08/27/2011  . Spell of transient neurologic symptoms 06/20/2011  . External otitis 05/13/2011  . GERD (gastroesophageal reflux disease) 02/25/2011  . CARDIOMYOPATHY, SECONDARY 01/29/2009  . AV BLOCK, COMPLETE 01/29/2009  . IRRITABLE BOWEL SYNDROME 01/29/2009  . ANEURYSM, SPLENIC ARTERY 01/25/2009  . COLONIC POLYPS, ADENOMATOUS, HX OF 01/25/2009  . DIABETES MELLITUS, TYPE II 12/28/2006  . HYPERLIPIDEMIA 12/28/2006  . GOUT 12/28/2006  . HYPERTENSION 12/28/2006  . CORONARY ARTERY DISEASE 12/28/2006  . ALLERGIC RHINITIS 12/28/2006  . OSTEOARTHRITIS 12/28/2006  . OSTEOPENIA 12/28/2006   Past Medical History  Diagnosis Date  . Diabetes  mellitus     Type II  . Hypertension   . Gout   . Allergy     allergic rhinitis  . CAD (coronary artery disease)     pulmonary hypertension// Cardiac cath 2001//2004 carotid dopplers Normal  . CHF (congestive heart failure)     non ish. cardiomyopathy// Dr. Harrington Challenger cardiologist  . Cancer 2002    Renal cell cancer partial nephrectomy  . GERD (gastroesophageal reflux disease) 2003    EGD polyp; gastritis HH//Dr. Carlean Purl GI  . Diverticula of intestine 2003    colonoscopy polyp; hem; divertic  . Cataract 2008    Right eye  . Personal history of kidney stones   . Hyperlipidemia     TRIG  . Osteopenia 2002    Dexa  . IBS (irritable bowel syndrome)   . Choledocholithiasis   . Transfusion history 1982    PUD/Bleed/Transfusion  . Osteoarthritis 07/12/2007/ 10/2004    spine and knees// LS Degen. changes   .  Chronic kidney disease 05/2006    Renal failure ; hospitalized   Past Surgical History  Procedure Laterality Date  . Eye surgery  2008    Right eye cataract  . Cystoscopy w/ stone manipulation  2010- 2011    ERCPS and clearing of choledocholithiasis  . Ercp  2010  . Colonoscopy  09/2006    polyps  . Abdominal hysterectomy  1999    hysterectomy with bladder tack   History  Substance Use Topics  . Smoking status: Never Smoker   . Smokeless tobacco: Not on file  . Alcohol Use: No   Family History  Problem Relation Age of Onset  . Cancer Daughter     breast cancer  . Cancer Son 39    Esophageal Cancer  . Heart disease Daughter    Allergies  Allergen Reactions  . Aspirin     REACTION: stomach upset  . Atorvastatin     REACTION: muscle pain  . Celecoxib     REACTION: stomach upset  . Fenofibrate     REACTION: muscle pain  . Metformin     REACTION: diarrhea  . Moxifloxacin     REACTION: stomach upset  . Niacin     REACTION: muscle pain   Current Outpatient Prescriptions on File Prior to Visit  Medication Sig Dispense Refill  . acetaminophen (TYLENOL) 325 MG tablet OTC as directed       . amLODipine (NORVASC) 5 MG tablet Take 1 tablet (5 mg total) by mouth 2 (two) times daily.  60 tablet  3  . BD ULTRA-FINE LANCETS lancets Use to check glucose once daily and in the evening       . carvedilol (COREG) 25 MG tablet Take 1 tablet (25 mg total) by mouth 2 (two) times daily with a meal.  60 tablet  11  . Cholecalciferol (VITAMIN D3) 1000 UNITS CAPS Take 1,000 Units by mouth daily.        Marland Kitchen dicyclomine (BENTYL) 10 MG capsule take 1 capsule by mouth three times a day  90 capsule  0  . dorzolamide-timolol (COSOPT) 22.3-6.8 MG/ML ophthalmic solution Place 1 drop into the left eye 2 (two) times daily. As directed.      . gabapentin (NEURONTIN) 300 MG capsule take 2 capsules by mouth twice a day  120 capsule  3  . Glucose Blood (BL TEST STRIP PACK VI)       . glucose blood test  strip Test two times a day as directed       .  insulin lispro protamine-insulin lispro (HUMALOG MIX 75/25 PEN) (75-25) 100 UNIT/ML SUSP Inject 8 Units into the skin daily. 8 units in the AM and 6 units in the PM      . latanoprost (XALATAN) 0.005 % ophthalmic solution 1 drop at bedtime.      Marland Kitchen loperamide (LOPERAMIDE A-D) 2 MG tablet As needed       . NEXIUM 40 MG capsule take 1 capsule by mouth once daily  30 capsule  5  . ramipril (ALTACE) 10 MG capsule take 1 capsule by mouth once daily  30 capsule  5  . simvastatin (ZOCOR) 20 MG tablet take 1 tablet by mouth once daily  30 tablet  5   No current facility-administered medications on file prior to visit.     Review of Systems Review of Systems  Constitutional: Negative for fever, appetite change, fatigue and unexpected weight change.  Eyes: Negative for pain and visual disturbance.  Respiratory: Negative for cough and shortness of breath.   Cardiovascular: Negative for cp or palpitations    Gastrointestinal: Negative for nausea, diarrhea and constipation.  Genitourinary: Negative for urgency and frequency.  Skin: Negative for pallor or rash   MSK pos for arthritis pain  Neurological: Negative for weakness, light-headedness, numbness and headaches.  Hematological: Negative for adenopathy. Does not bruise/bleed easily.  Psychiatric/Behavioral: Negative for dysphoric mood. The patient is not nervous/anxious. Pos for forgetfulness         Objective:   Physical Exam  Constitutional: She appears well-developed and well-nourished. No distress.  Elderly and frail appearing   HENT:  Head: Normocephalic and atraumatic.  Right Ear: External ear normal.  Left Ear: External ear normal.  Nose: Nose normal.  Mouth/Throat: Oropharynx is clear and moist.  Eyes: Conjunctivae and EOM are normal. Pupils are equal, round, and reactive to light. Right eye exhibits no discharge. Left eye exhibits no discharge. No scleral icterus.  Neck: Normal range  of motion. Neck supple. No JVD present. No thyromegaly present.  Cardiovascular: Normal rate, regular rhythm, normal heart sounds and intact distal pulses.  Exam reveals no gallop.   Pulmonary/Chest: Effort normal and breath sounds normal. No respiratory distress. She has no wheezes. She has no rales.  Abdominal: Soft. Bowel sounds are normal. She exhibits no distension and no mass. There is no tenderness.  Musculoskeletal: She exhibits no edema and no tenderness.  Mild kyphosis OA changes in hands and knees   Lymphadenopathy:    She has no cervical adenopathy.  Neurological: She is alert. She has normal reflexes. No cranial nerve deficit. She exhibits normal muscle tone. Coordination normal.  Skin: Skin is warm and dry. No rash noted. No erythema. No pallor.  Psychiatric: She has a normal mood and affect.          Assessment & Plan:   Problem List Items Addressed This Visit     Cardiovascular and Mediastinum   HYPERTENSION - Primary      bp in fair control at this time  BP Readings from Last 1 Encounters:  03/14/14 124/56   No changes needed Disc lifstyle change with low sodium diet and exercise  Labs reviewed     Relevant Medications      ramipril (ALTACE) capsule      simvastatin (ZOCOR) tablet     Musculoskeletal and Integument   OSTEOPENIA     Schedule dexa No falls or fractures  Disc ca and D D level is tx     Relevant Orders  DG Bone Density     Other   HYPERLIPIDEMIA     Disc goals for lipids and reasons to control them Rev labs with pt Rev low sat fat diet in detail  In good control with simvastatin and diet     Relevant Medications      ramipril (ALTACE) capsule      simvastatin (ZOCOR) tablet   Vitamin D deficiency disease     Vitamin D level is therapeutic with current supplementation Disc importance of this to bone and overall health       Other Visit Diagnoses   Need for vaccination with 13-polyvalent pneumococcal conjugate vaccine         Relevant Orders       Pneumococcal conjugate vaccine 13-valent (Completed)

## 2014-03-14 NOTE — Assessment & Plan Note (Signed)
Vitamin D level is therapeutic with current supplementation Disc importance of this to bone and overall health  

## 2014-03-14 NOTE — Progress Notes (Signed)
Pre visit review using our clinic review tool, if applicable. No additional management support is needed unless otherwise documented below in the visit note. 

## 2014-03-14 NOTE — Assessment & Plan Note (Signed)
Schedule dexa No falls or fractures  Disc ca and D D level is tx

## 2014-03-14 NOTE — Assessment & Plan Note (Signed)
bp in fair control at this time  BP Readings from Last 1 Encounters:  03/14/14 124/56   No changes needed Disc lifstyle change with low sodium diet and exercise  Labs reviewed

## 2014-03-14 NOTE — Assessment & Plan Note (Signed)
Disc goals for lipids and reasons to control them Rev labs with pt Rev low sat fat diet in detail  In good control with simvastatin and diet

## 2014-03-14 NOTE — Patient Instructions (Addendum)
I'm glad you are feeling well  Take care of herself  prevnar vaccine (to prevent pneumonia) today  Try to eat a healthy diet and stay active (safely- use walker on level surfaces) Follow up in one year for annual exam with labs prior  Please stop at check out to schedule bone density test

## 2014-03-15 ENCOUNTER — Telehealth: Payer: Self-pay | Admitting: Family Medicine

## 2014-03-15 NOTE — Telephone Encounter (Signed)
Relevant patient education assigned to patient using Emmi. ° °

## 2014-03-21 ENCOUNTER — Ambulatory Visit
Admission: RE | Admit: 2014-03-21 | Discharge: 2014-03-21 | Disposition: A | Discharge status: Home / Self Care | Payer: Medicare Other | Source: Ambulatory Visit | Attending: Family Medicine | Admitting: Family Medicine

## 2014-03-21 DIAGNOSIS — M949 Disorder of cartilage, unspecified: Principal | ICD-10-CM

## 2014-03-21 DIAGNOSIS — M899 Disorder of bone, unspecified: Secondary | ICD-10-CM

## 2014-03-27 ENCOUNTER — Ambulatory Visit (INDEPENDENT_AMBULATORY_CARE_PROVIDER_SITE_OTHER): Payer: Medicare Other | Admitting: Internal Medicine

## 2014-03-27 ENCOUNTER — Encounter: Payer: Self-pay | Admitting: Internal Medicine

## 2014-03-27 VITALS — BP 150/50 | HR 61 | Ht 59.0 in | Wt 133.0 lb

## 2014-03-27 DIAGNOSIS — E785 Hyperlipidemia, unspecified: Secondary | ICD-10-CM

## 2014-03-27 DIAGNOSIS — I10 Essential (primary) hypertension: Secondary | ICD-10-CM

## 2014-03-27 DIAGNOSIS — I509 Heart failure, unspecified: Secondary | ICD-10-CM

## 2014-03-27 DIAGNOSIS — I5022 Chronic systolic (congestive) heart failure: Secondary | ICD-10-CM

## 2014-03-27 NOTE — Patient Instructions (Signed)
Your physician recommends that you continue on your current medications as directed. Please refer to the Current Medication list given to you today.  Your physician wants you to follow-up in: follow up in January  You will receive a reminder letter in the mail two months in advance. If you don't receive a letter, please call our office to schedule the follow-up appointment.

## 2014-03-27 NOTE — Progress Notes (Signed)
HPI Patient is an 80 year old with a history of NICM (s/p CRT P) Repeat eval witn normal LV function. Also a history of HL and HTN.  I saw her in clnic in January SInce seen she has done fairly well.  Swallowing can be difficult at times, esp with meets  Wt is down about 10#   The patient denies any problem though.   She denies CP  Breathing is stable.  Sleeping good  Appetite good.    Allergies  Allergen Reactions  . Aspirin     REACTION: stomach upset  . Atorvastatin     REACTION: muscle pain  . Celecoxib     REACTION: stomach upset  . Fenofibrate     REACTION: muscle pain  . Metformin     REACTION: diarrhea  . Moxifloxacin     REACTION: stomach upset  . Niacin     REACTION: muscle pain    Current Outpatient Prescriptions  Medication Sig Dispense Refill  . acetaminophen (TYLENOL) 325 MG tablet OTC as directed       . amLODipine (NORVASC) 5 MG tablet Take 1 tablet (5 mg total) by mouth 2 (two) times daily.  60 tablet  3  . BD ULTRA-FINE LANCETS lancets Use to check glucose once daily and in the evening       . carvedilol (COREG) 25 MG tablet Take 1 tablet (25 mg total) by mouth 2 (two) times daily with a meal.  60 tablet  11  . Cholecalciferol (VITAMIN D3) 1000 UNITS CAPS Take 1,000 Units by mouth daily.        Marland Kitchen dicyclomine (BENTYL) 10 MG capsule take 1 capsule by mouth three times a day  90 capsule  11  . dorzolamide-timolol (COSOPT) 22.3-6.8 MG/ML ophthalmic solution Place 1 drop into the left eye 2 (two) times daily. As directed.      Marland Kitchen esomeprazole (NEXIUM) 40 MG capsule take 1 capsule by mouth once daily  30 capsule  11  . gabapentin (NEURONTIN) 300 MG capsule take 2 capsules by mouth twice a day  120 capsule  11  . Glucose Blood (BL TEST STRIP PACK VI)       . glucose blood test strip Test two times a day as directed       . insulin lispro protamine-insulin lispro (HUMALOG MIX 75/25 PEN) (75-25) 100 UNIT/ML SUSP Inject 8 Units into the skin daily. 8 units in the AM and 6  units in the PM      . latanoprost (XALATAN) 0.005 % ophthalmic solution 1 drop at bedtime.      Marland Kitchen loperamide (LOPERAMIDE A-D) 2 MG tablet As needed       . ramipril (ALTACE) 10 MG capsule take 1 capsule by mouth once daily  30 capsule  11  . simvastatin (ZOCOR) 20 MG tablet take 1 tablet by mouth once daily  30 tablet  11   No current facility-administered medications for this visit.    Past Medical History  Diagnosis Date  . Diabetes mellitus     Type II  . Hypertension   . Gout   . Allergy     allergic rhinitis  . CAD (coronary artery disease)     pulmonary hypertension// Cardiac cath 2001//2004 carotid dopplers Normal  . CHF (congestive heart failure)     non ish. cardiomyopathy// Dr. Harrington Challenger cardiologist  . Cancer 2002    Renal cell cancer partial nephrectomy  . GERD (gastroesophageal reflux disease) 2003  EGD polyp; gastritis HH//Dr. Carlean Purl GI  . Diverticula of intestine 2003    colonoscopy polyp; hem; divertic  . Cataract 2008    Right eye  . Personal history of kidney stones   . Hyperlipidemia     TRIG  . Osteopenia 2002    Dexa  . IBS (irritable bowel syndrome)   . Choledocholithiasis   . Transfusion history 1982    PUD/Bleed/Transfusion  . Osteoarthritis 07/12/2007/ 10/2004    spine and knees// LS Degen. changes   . Chronic kidney disease 05/2006    Renal failure ; hospitalized    Past Surgical History  Procedure Laterality Date  . Eye surgery  2008    Right eye cataract  . Cystoscopy w/ stone manipulation  2010- 2011    ERCPS and clearing of choledocholithiasis  . Ercp  2010  . Colonoscopy  09/2006    polyps  . Abdominal hysterectomy  1999    hysterectomy with bladder tack    Family History  Problem Relation Age of Onset  . Cancer Daughter     breast cancer  . Cancer Son 68    Esophageal Cancer  . Heart disease Daughter     History   Social History  . Marital Status: Widowed    Spouse Name: N/A    Number of Children: N/A  . Years of  Education: N/A   Occupational History  . Not on file.   Social History Main Topics  . Smoking status: Never Smoker   . Smokeless tobacco: Not on file  . Alcohol Use: No  . Drug Use: No  . Sexual Activity: Not on file   Other Topics Concern  . Not on file   Social History Narrative  . No narrative on file    Review of Systems:  All systems reviewed.  They are negative to the above problem except as previously stated.  Vital Signs: BP 150/50  Pulse 61  Ht 4\' 11"  (1.499 m)  Wt 133 lb (60.328 kg)  BMI 26.85 kg/m2  Physical Exam Patient is in NAD  Examined in wheelchair HEENT:  Normocephalic, atraumatic. EOMI, PERRLA.  Neck: JVP is normal.  No bruits.  Lungs: clear to auscultation. No rales no wheezes.  Heart: Regular rate and rhythm. Normal S1, S2. No S3.   No significant murmurs. PMI not displaced.  Abdomen:  Supple, nontender. Normal bowel sounds. No masses. No hepatomegaly.  Extremities:   Good distal pulses throughout. No lower extremity edema.  Musculoskeletal :moving all extremities.  Neuro:   alert and oriented x3.  CN II-XII grossly intact.   Assessment and Plan:  1.  Cardiomyopathy.  Volume status is good  LV function has hormalized after BiV placed.  Keep on same regimen  2.  HTN  BP is readings at home are overall good.    3.  GI  Told daughter if she continues to have trouble with swallowing or gets worse would recomm barium swallow.or formal GI eval  F/U in December

## 2014-04-03 ENCOUNTER — Ambulatory Visit (INDEPENDENT_AMBULATORY_CARE_PROVIDER_SITE_OTHER): Payer: Medicare Other | Admitting: *Deleted

## 2014-04-03 DIAGNOSIS — I442 Atrioventricular block, complete: Secondary | ICD-10-CM

## 2014-04-03 DIAGNOSIS — I429 Cardiomyopathy, unspecified: Secondary | ICD-10-CM

## 2014-04-03 NOTE — Progress Notes (Signed)
Remote pacemaker transmission.   

## 2014-04-04 ENCOUNTER — Other Ambulatory Visit: Payer: Self-pay | Admitting: *Deleted

## 2014-04-04 DIAGNOSIS — I10 Essential (primary) hypertension: Secondary | ICD-10-CM

## 2014-04-04 MED ORDER — AMLODIPINE BESYLATE 5 MG PO TABS: 5.0000 mg | ORAL_TABLET | Freq: Two times a day (BID) | ORAL | Status: DC

## 2014-04-07 LAB — MDC_IDC_ENUM_SESS_TYPE_REMOTE
Battery Remaining Longevity: 48 mo
Brady Statistic RV Percent Paced: 100 %
Implantable Pulse Generator Serial Number: 100410
Lead Channel Impedance Value: 439 Ohm
Lead Channel Impedance Value: 710 Ohm
Lead Channel Sensing Intrinsic Amplitude: 3.6 mV
Lead Channel Setting Pacing Amplitude: 4.5 V
Lead Channel Setting Pacing Pulse Width: 0.4 ms
Lead Channel Setting Pacing Pulse Width: 1 ms
MDC IDC MSMT LEADCHNL RV IMPEDANCE VALUE: 789 Ohm
MDC IDC SET LEADCHNL RA PACING AMPLITUDE: 2 V
MDC IDC SET LEADCHNL RV PACING AMPLITUDE: 2.4 V
MDC IDC SET LEADCHNL RV SENSING SENSITIVITY: 2.5 mV
MDC IDC STAT BRADY RA PERCENT PACED: 100 %

## 2014-05-02 ENCOUNTER — Ambulatory Visit: Payer: Medicare Other

## 2014-05-09 ENCOUNTER — Encounter: Payer: Self-pay | Admitting: Cardiology

## 2014-05-15 ENCOUNTER — Encounter: Payer: Self-pay | Admitting: Internal Medicine

## 2014-05-16 ENCOUNTER — Ambulatory Visit (INDEPENDENT_AMBULATORY_CARE_PROVIDER_SITE_OTHER): Payer: Medicare Other

## 2014-05-16 DIAGNOSIS — E1149 Type 2 diabetes mellitus with other diabetic neurological complication: Secondary | ICD-10-CM

## 2014-05-16 DIAGNOSIS — M79676 Pain in unspecified toe(s): Secondary | ICD-10-CM

## 2014-05-16 DIAGNOSIS — M79609 Pain in unspecified limb: Secondary | ICD-10-CM

## 2014-05-16 DIAGNOSIS — B351 Tinea unguium: Secondary | ICD-10-CM

## 2014-05-16 DIAGNOSIS — E1140 Type 2 diabetes mellitus with diabetic neuropathy, unspecified: Secondary | ICD-10-CM

## 2014-05-16 DIAGNOSIS — E1142 Type 2 diabetes mellitus with diabetic polyneuropathy: Secondary | ICD-10-CM

## 2014-05-16 NOTE — Patient Instructions (Signed)
Diabetes and Foot Care Diabetes may cause you to have problems because of poor blood supply (circulation) to your feet and legs. This may cause the skin on your feet to become thinner, break easier, and heal more slowly. Your skin may become dry, and the skin may peel and crack. You may also have nerve damage in your legs and feet causing decreased feeling in them. You may not notice minor injuries to your feet that could lead to infections or more serious problems. Taking care of your feet is one of the most important things you can do for yourself.  HOME CARE INSTRUCTIONS  Wear shoes at all times, even in the house. Do not go barefoot. Bare feet are easily injured.  Check your feet daily for blisters, cuts, and redness. If you cannot see the bottom of your feet, use a mirror or ask someone for help.  Wash your feet with warm water (do not use hot water) and mild soap. Then pat your feet and the areas between your toes until they are completely dry. Do not soak your feet as this can dry your skin.  Apply a moisturizing lotion or petroleum jelly (that does not contain alcohol and is unscented) to the skin on your feet and to dry, brittle toenails. Do not apply lotion between your toes.  Trim your toenails straight across. Do not dig under them or around the cuticle. File the edges of your nails with an emery board or nail file.  Do not cut corns or calluses or try to remove them with medicine.  Wear clean socks or stockings every day. Make sure they are not too tight. Do not wear knee-high stockings since they may decrease blood flow to your legs.  Wear shoes that fit properly and have enough cushioning. To break in new shoes, wear them for just a few hours a day. This prevents you from injuring your feet. Always look in your shoes before you put them on to be sure there are no objects inside.  Do not cross your legs. This may decrease the blood flow to your feet.  If you find a minor scrape,  cut, or break in the skin on your feet, keep it and the skin around it clean and dry. These areas may be cleansed with mild soap and water. Do not cleanse the area with peroxide, alcohol, or iodine.  When you remove an adhesive bandage, be sure not to damage the skin around it.  If you have a wound, look at it several times a day to make sure it is healing.  Do not use heating pads or hot water bottles. They may burn your skin. If you have lost feeling in your feet or legs, you may not know it is happening until it is too late.  Make sure your health care provider performs a complete foot exam at least annually or more often if you have foot problems. Report any cuts, sores, or bruises to your health care provider immediately. SEEK MEDICAL CARE IF:   You have an injury that is not healing.  You have cuts or breaks in the skin.  You have an ingrown nail.  You notice redness on your legs or feet.  You feel burning or tingling in your legs or feet.  You have pain or cramps in your legs and feet.  Your legs or feet are numb.  Your feet always feel cold. SEEK IMMEDIATE MEDICAL CARE IF:   There is increasing redness,   swelling, or pain in or around a wound.  There is a red line that goes up your leg.  Pus is coming from a wound.  You develop a fever or as directed by your health care provider.  You notice a bad smell coming from an ulcer or wound. Document Released: 08/29/2000 Document Revised: 05/04/2013 Document Reviewed: 02/08/2013 Exit[employer] Patient Information 2015 [employer]. This information is not intended to replace advice given to you by your health care provider. Make sure you discuss any questions you have with your health care provider.  

## 2014-05-16 NOTE — Progress Notes (Signed)
   Subjective:    Patient ID: [name], female    DOB: 02-May-1924, 80 y.o.   MRN: [id number]  HPI Comments: Pt request the debridement of 10 toenails and exam for diabetes.     Review of Systems no new findings or systemic changes noted     Objective:   Physical Exam No new findings vascular status is intact with pedal pulses palpable DP and PT plus one over 4 bilateral. Capillary refill timed 3-4 seconds all digits epicritic sensations decreased the forefoot toes and arch. DTRs not elicited dermatologically skin color pigment normal hair growth absent nails criptotic incurvated friable 1 through 5 bilateral with some yellowing discoloration and brittleness of the nails open wounds or ulcers no secondary infection is noted       Assessment & Plan:  Assessment diabetes with history peripheral neuropathy thick brittle crumbly dystrophic nails 1 through 5 bilateral painful tender and symptomatic both with ambulation and with enclosed shoes to the brittleness discoloration and tenderness and onychomycosis painful mycotic nails debrided x10 return for future diabetic foot and palliative nail care is needed  Harriet Masson DPM

## 2014-05-31 ENCOUNTER — Ambulatory Visit (INDEPENDENT_AMBULATORY_CARE_PROVIDER_SITE_OTHER): Payer: Medicare Other

## 2014-05-31 DIAGNOSIS — Z23 Encounter for immunization: Secondary | ICD-10-CM

## 2014-07-13 ENCOUNTER — Ambulatory Visit (INDEPENDENT_AMBULATORY_CARE_PROVIDER_SITE_OTHER): Payer: Medicare Other | Admitting: Internal Medicine

## 2014-07-13 ENCOUNTER — Encounter: Payer: Self-pay | Admitting: Internal Medicine

## 2014-07-13 VITALS — BP 158/50 | HR 60 | Ht 64.0 in | Wt 127.0 lb

## 2014-07-13 DIAGNOSIS — I5022 Chronic systolic (congestive) heart failure: Secondary | ICD-10-CM

## 2014-07-13 DIAGNOSIS — Z45018 Encounter for adjustment and management of other part of cardiac pacemaker: Secondary | ICD-10-CM

## 2014-07-13 DIAGNOSIS — I255 Ischemic cardiomyopathy: Secondary | ICD-10-CM

## 2014-07-13 DIAGNOSIS — I442 Atrioventricular block, complete: Secondary | ICD-10-CM

## 2014-07-13 LAB — MDC_IDC_ENUM_SESS_TYPE_INCLINIC
Brady Statistic RA Percent Paced: 85 %
Brady Statistic RV Percent Paced: 100 %
Implantable Pulse Generator Serial Number: 100410
Lead Channel Impedance Value: 437 Ohm
Lead Channel Impedance Value: 652 Ohm
Lead Channel Impedance Value: 655 Ohm
Lead Channel Pacing Threshold Amplitude: 0.6 V
Lead Channel Pacing Threshold Pulse Width: 0.4 ms
Lead Channel Sensing Intrinsic Amplitude: 4.3 mV
Lead Channel Setting Pacing Amplitude: 2 V
Lead Channel Setting Pacing Pulse Width: 0.4 ms
Lead Channel Setting Pacing Pulse Width: 1 ms
MDC IDC MSMT BATTERY REMAINING LONGEVITY: 42 mo
MDC IDC MSMT LEADCHNL LV PACING THRESHOLD AMPLITUDE: 4 V
MDC IDC MSMT LEADCHNL LV PACING THRESHOLD PULSEWIDTH: 1 ms
MDC IDC MSMT LEADCHNL RV PACING THRESHOLD AMPLITUDE: 0.6 V
MDC IDC MSMT LEADCHNL RV PACING THRESHOLD PULSEWIDTH: 0.4 ms
MDC IDC SET LEADCHNL LV PACING AMPLITUDE: 4.5 V
MDC IDC SET LEADCHNL RV PACING AMPLITUDE: 2.4 V
MDC IDC SET LEADCHNL RV SENSING SENSITIVITY: 2.5 mV

## 2014-07-13 NOTE — Progress Notes (Signed)
Patient Care Team: Abner Greenspan, MD as PCP - General   HPI  [name] is a 80 y.o. female  is seen in followup of NICM (now normal functionby echo September 2011), s/p CRT-D >>CRT-P      She is quite demented.   She has no complaints of her daughter notes that she is short of breath. There has been no edema. There've been no specific complaints of chest pain.    Past Medical History  Diagnosis Date  . Diabetes mellitus     Type II  . Hypertension   . Gout   . Allergy     allergic rhinitis  . CAD (coronary artery disease)     pulmonary hypertension// Cardiac cath 2001//2004 carotid dopplers Normal  . CHF (congestive heart failure)     non ish. cardiomyopathy// Dr. Harrington Challenger cardiologist  . Cancer 2002    Renal cell cancer partial nephrectomy  . GERD (gastroesophageal reflux disease) 2003    EGD polyp; gastritis HH//Dr. Carlean Purl GI  . Diverticula of intestine 2003    colonoscopy polyp; hem; divertic  . Cataract 2008    Right eye  . Personal history of kidney stones   . Hyperlipidemia     TRIG  . Osteopenia 2002    Dexa  . IBS (irritable bowel syndrome)   . Choledocholithiasis   . Transfusion history 1982    PUD/Bleed/Transfusion  . Osteoarthritis 07/12/2007/ 10/2004    spine and knees// LS Degen. changes   . Chronic kidney disease 05/2006    Renal failure ; hospitalized    Past Surgical History  Procedure Laterality Date  . Eye surgery  2008    Right eye cataract  . Cystoscopy w/ stone manipulation  2010- 2011    ERCPS and clearing of choledocholithiasis  . Ercp  2010  . Colonoscopy  09/2006    polyps  . Abdominal hysterectomy  1999    hysterectomy with bladder tack    Current Outpatient Prescriptions  Medication Sig Dispense Refill  . acetaminophen (TYLENOL) 325 MG tablet OTC as directed       . amLODipine (NORVASC) 5 MG tablet Take 1 tablet (5 mg total) by mouth 2 (two) times daily.  60 tablet  3  . BD ULTRA-FINE LANCETS lancets Use to check glucose  once daily and in the evening       . carvedilol (COREG) 25 MG tablet Take 1 tablet (25 mg total) by mouth 2 (two) times daily with a meal.  60 tablet  11  . Cholecalciferol (VITAMIN D3) 1000 UNITS CAPS Take 1,000 Units by mouth daily.        Marland Kitchen dicyclomine (BENTYL) 10 MG capsule take 1 capsule by mouth three times a day  90 capsule  11  . dorzolamide-timolol (COSOPT) 22.3-6.8 MG/ML ophthalmic solution Place 1 drop into the left eye 2 (two) times daily. As directed.      Marland Kitchen esomeprazole (NEXIUM) 40 MG capsule take 1 capsule by mouth once daily  30 capsule  11  . gabapentin (NEURONTIN) 300 MG capsule take 2 capsules by mouth twice a day  120 capsule  11  . Glucose Blood (BL TEST STRIP PACK VI)       . glucose blood test strip Test one time a day as directed      . insulin lispro protamine-insulin lispro (HUMALOG MIX 75/25 PEN) (75-25) 100 UNIT/ML SUSP Inject 8 Units into the skin daily. 8 units in the AM      .  latanoprost (XALATAN) 0.005 % ophthalmic solution 1 drop at bedtime.      Marland Kitchen loperamide (LOPERAMIDE A-D) 2 MG tablet As needed       . ramipril (ALTACE) 10 MG capsule take 1 capsule by mouth once daily  30 capsule  11  . simvastatin (ZOCOR) 20 MG tablet take 1 tablet by mouth once daily  30 tablet  11   No current facility-administered medications for this visit.    Allergies  Allergen Reactions  . Aspirin     REACTION: stomach upset  . Atorvastatin     REACTION: muscle pain  . Celecoxib     REACTION: stomach upset  . Fenofibrate     REACTION: muscle pain  . Metformin     REACTION: diarrhea  . Moxifloxacin     REACTION: stomach upset  . Niacin     REACTION: muscle pain    Review of Systems negative except from HPI and PMH  Physical Exam BP 158/50  Pulse 60  Ht 5\' 4"  (1.626 m)  Wt 127 lb (57.607 kg)  BMI 21.79 kg/m2 Well developed and well nourished in no acute distress HENT normal E scleral and icterus clear Neck Supple JVP flat; carotids brisk and full Clear to  ausculation  Device pocket well healed; without hematoma or erythema.  There is no tethering regular rate and rhythm, no murmurs gallops or rub Soft with active bowel sounds No clubbing cyanosis none Edema Alert and oriented, grossly normal motor and sensory function Skin Warm and Dry    Assessment and  Plan  Complete heart block stable pst pacing  Pacemaker-CRT The patient's device was interrogated.  The information was reviewed. No changes were made in the programming.     Ischemic cardiomyopathy Without symptoms of ischemia   Chronic systolic heart failure  Dementia   The patient is stable. We will continue her current medications.

## 2014-07-13 NOTE — Patient Instructions (Signed)
Your physician recommends that you continue on your current medications as directed. Please refer to the Current Medication list given to you today.  Your physician wants you to follow-up in: 12 months with Dr. Gari Crown will receive a reminder letter in the mail two months in advance. If you don't receive a letter, please call our office to schedule the follow-up appointment.  Thank you for visiting with [facility] today!!

## 2014-08-03 ENCOUNTER — Other Ambulatory Visit: Payer: Self-pay | Admitting: *Deleted

## 2014-08-03 DIAGNOSIS — I10 Essential (primary) hypertension: Secondary | ICD-10-CM

## 2014-08-03 MED ORDER — AMLODIPINE BESYLATE 5 MG PO TABS: 5.0000 mg | ORAL_TABLET | Freq: Two times a day (BID) | ORAL | Status: DC

## 2014-08-22 ENCOUNTER — Ambulatory Visit: Payer: Medicare Other

## 2014-09-05 ENCOUNTER — Ambulatory Visit: Payer: Medicare Other

## 2014-09-19 ENCOUNTER — Encounter: Payer: Self-pay | Admitting: Internal Medicine

## 2014-10-03 ENCOUNTER — Ambulatory Visit (INDEPENDENT_AMBULATORY_CARE_PROVIDER_SITE_OTHER): Payer: Medicare Other

## 2014-10-03 DIAGNOSIS — M79673 Pain in unspecified foot: Secondary | ICD-10-CM | POA: Diagnosis not present

## 2014-10-03 DIAGNOSIS — B351 Tinea unguium: Secondary | ICD-10-CM | POA: Diagnosis not present

## 2014-10-03 DIAGNOSIS — E1140 Type 2 diabetes mellitus with diabetic neuropathy, unspecified: Secondary | ICD-10-CM | POA: Diagnosis not present

## 2014-10-03 LAB — HM DIABETES FOOT EXAM: HM Diabetic Foot Exam: NORMAL

## 2014-10-03 NOTE — Progress Notes (Signed)
   Subjective:    Patient ID: [name], female    DOB: Jan 18, 1924, 81 y.o.   MRN: [id number]  HPI Pt presents for nail debridement   Review of Systems no new findings or systemic changes noted    Objective:   Physical Exam Neurovascular unchanged pedal pulses palpable DP +2 PT plus one over 4 Refill time 3 seconds epicritic and proprioceptive sensations intact and unchanged there is normal plantar response DTRs no elicited there is decreased sensation to the forefoot digits and arch. No open wounds no ulcers no secondary infections nails thick brittle [place] friable dystrophic 1 through 5 bilateral       Assessment & Plan:  Assessment this time is diabetes history peripheral neuropathy thick painful mycotic dystrophic nails debrided 10 return for future palliative diabetic foot nail care every 3 months as recommended  Harriet Masson DPM

## 2014-10-16 ENCOUNTER — Ambulatory Visit (INDEPENDENT_AMBULATORY_CARE_PROVIDER_SITE_OTHER): Payer: Medicare Other | Admitting: *Deleted

## 2014-10-16 DIAGNOSIS — I442 Atrioventricular block, complete: Secondary | ICD-10-CM

## 2014-10-16 NOTE — Progress Notes (Signed)
Remote pacemaker transmission.   

## 2014-10-17 LAB — MDC_IDC_ENUM_SESS_TYPE_REMOTE
Battery Remaining Longevity: 42 mo
Battery Remaining Percentage: 56 %
Brady Statistic RA Percent Paced: 87 %
Brady Statistic RV Percent Paced: 100 %
Date Time Interrogation Session: 20160201094200
Implantable Pulse Generator Serial Number: 100410
Lead Channel Impedance Value: 435 Ohm
Lead Channel Impedance Value: 646 Ohm
Lead Channel Impedance Value: 750 Ohm
Lead Channel Pacing Threshold Amplitude: 0.6 V
Lead Channel Pacing Threshold Amplitude: 4 V
Lead Channel Pacing Threshold Pulse Width: 0.4 ms
Lead Channel Setting Pacing Amplitude: 2.4 V
Lead Channel Setting Pacing Amplitude: 4.5 V
Lead Channel Setting Pacing Pulse Width: 0.4 ms
Lead Channel Setting Sensing Sensitivity: 2.5 mV
MDC IDC MSMT LEADCHNL LV PACING THRESHOLD PULSEWIDTH: 1 ms
MDC IDC SET LEADCHNL LV PACING PULSEWIDTH: 1 ms
MDC IDC SET LEADCHNL RA PACING AMPLITUDE: 2 V
MDC IDC SET LEADCHNL RV SENSING SENSITIVITY: 2.5 mV
MDC IDC SET ZONE DETECTION INTERVAL: 375 ms

## 2014-10-20 DIAGNOSIS — H4011X4 Primary open-angle glaucoma, indeterminate stage: Secondary | ICD-10-CM | POA: Diagnosis not present

## 2014-10-20 LAB — HM DIABETES EYE EXAM

## 2014-10-24 ENCOUNTER — Encounter: Payer: Self-pay | Admitting: Cardiology

## 2014-10-25 NOTE — Progress Notes (Signed)
Cardiology Office Note   Date:  10/27/2014   ID:  [name], [name] 08/16/1924, MRN [id number]  PCP:  Loura Pardon, MD  Cardiologist:   Dorris Carnes, MD   Patinet presents for f/u of CHF,     History of Present Illness: [name] is a 81 y.o. female with a history of NICM (s/p CRT P) Repeat eval witn normal LV function. Also a history of HL and HTN.  I saw her in clnic in July 2015  Since then she says her breathing has been good  No SOB  No CP Daughter says she sleeps a lot  Does sleep through the night   Wt is down some  Slow to eat/chew        Current Outpatient Prescriptions  Medication Sig Dispense Refill  . acetaminophen (TYLENOL) 325 MG tablet OTC as directed     . amLODipine (NORVASC) 5 MG tablet Take 1 tablet (5 mg total) by mouth 2 (two) times daily. 60 tablet 12  . BD ULTRA-FINE LANCETS lancets Use to check glucose once daily and in the evening     . carvedilol (COREG) 25 MG tablet take 1 tablet by mouth twice a day with meals 60 tablet 3  . Cholecalciferol (VITAMIN D3) 1000 UNITS CAPS Take 1,000 Units by mouth daily.      Marland Kitchen dicyclomine (BENTYL) 10 MG capsule take 1 capsule by mouth three times a day 90 capsule 11  . dorzolamide-timolol (COSOPT) 22.3-6.8 MG/ML ophthalmic solution Place 1 drop into the left eye 2 (two) times daily. As directed.    Marland Kitchen esomeprazole (NEXIUM) 40 MG capsule take 1 capsule by mouth once daily 30 capsule 11  . gabapentin (NEURONTIN) 300 MG capsule take 2 capsules by mouth twice a day 120 capsule 11  . Glucose Blood (BL TEST STRIP PACK VI)     . glucose blood test strip Test one time a day as directed    . insulin lispro protamine-insulin lispro (HUMALOG MIX 75/25 PEN) (75-25) 100 UNIT/ML SUSP Inject 4 Units into the skin daily. 4 units in the AM    . latanoprost (XALATAN) 0.005 % ophthalmic solution 1 drop at bedtime.    Marland Kitchen loperamide (LOPERAMIDE A-D) 2 MG tablet As needed     . ramipril (ALTACE) 10 MG capsule take 1 capsule by mouth once daily  30 capsule 11  . simvastatin (ZOCOR) 20 MG tablet take 1 tablet by mouth once daily 30 tablet 11   No current facility-administered medications for this visit.    Allergies:   Aspirin; Atorvastatin; Celecoxib; Fenofibrate; Metformin; Moxifloxacin; and Niacin   Past Medical History  Diagnosis Date  . Diabetes mellitus     Type II  . Hypertension   . Gout   . Allergy     allergic rhinitis  . CAD (coronary artery disease)     pulmonary hypertension// Cardiac cath 2001//2004 carotid dopplers Normal  . CHF (congestive heart failure)     non ish. cardiomyopathy// Dr. Harrington Challenger cardiologist  . Cancer 2002    Renal cell cancer partial nephrectomy  . GERD (gastroesophageal reflux disease) 2003    EGD polyp; gastritis HH//Dr. Carlean Purl GI  . Diverticula of intestine 2003    colonoscopy polyp; hem; divertic  . Cataract 2008    Right eye  . Personal history of kidney stones   . Hyperlipidemia     TRIG  . Osteopenia 2002    Dexa  . IBS (irritable bowel syndrome)   .  Choledocholithiasis   . Transfusion history 1982    PUD/Bleed/Transfusion  . Osteoarthritis 07/12/2007/ 10/2004    spine and knees// LS Degen. changes   . Chronic kidney disease 05/2006    Renal failure ; hospitalized    Past Surgical History  Procedure Laterality Date  . Eye surgery  2008    Right eye cataract  . Cystoscopy w/ stone manipulation  2010- 2011    ERCPS and clearing of choledocholithiasis  . Ercp  2010  . Colonoscopy  09/2006    polyps  . Abdominal hysterectomy  1999    hysterectomy with bladder tack     Social History:  The patient  reports that she has never smoked. She does not have any smokeless tobacco history on file. She reports that she does not drink alcohol or use illicit drugs.   Family History:  The patient's family history includes Cancer in her daughter; Cancer (age of onset: 72) in her son; Heart disease in her daughter.    ROS:  Please see the history of present illness. All other  systems are reviewed and  Negative to the above problem except as noted.    PHYSICAL EXAM: VS:  BP 120/68 mmHg  Pulse 60  Ht 5\' 4"  (1.626 m)  Wt 121 lb (54.885 kg)  BMI 20.76 kg/m2  GEN: Well nourished, well developed, in no acute distress HEENT: normal Neck: no JVD, carotid bruits, or masses Cardiac: RRR; no murmurs, rubs, or gallops,no edema  Respiratory:  clear to auscultation bilaterally, normal work of breathing GI: soft, nontender, nondistended, + BS  No hepatomegaly  MS: Moving all extremities   Skin: warm and dry, no rash Neuro:  Strength and sensation are intact Psych: euthymic mood, full affect   EKG:  EKG is not ordered today.   Lipid Panel    Component Value Date/Time   CHOL 126 03/07/2014 0906   TRIG 130.0 03/07/2014 0906   HDL 36.80* 03/07/2014 0906   CHOLHDL 3 03/07/2014 0906   VLDL 26.0 03/07/2014 0906   LDLCALC 63 03/07/2014 0906   LDLDIRECT 79.3 08/30/2013 1229      Wt Readings from Last 3 Encounters:  10/27/14 121 lb (54.885 kg)  07/13/14 127 lb (57.607 kg)  03/27/14 133 lb (60.328 kg)      ASSESSMENT AND PLAN:  1.  NICM  Volume status looks good  K would keep on same meds.  2.  HTN   Good control  3l  HL  Good control     Current medicines are reviewed at length with the patient today.  The patient has no concerns re meds    The following changes have been made: No change  Labs/ tests ordered today include: BMET and CBC      Disposition:   FU with me in Oct   Signed, Dorris Carnes, MD  10/27/2014 3:12 PM    [address], [place], [place]  [address] Phone: [phone]; Fax: [phone]

## 2014-10-26 ENCOUNTER — Other Ambulatory Visit: Payer: Self-pay | Admitting: Internal Medicine

## 2014-10-27 ENCOUNTER — Ambulatory Visit (INDEPENDENT_AMBULATORY_CARE_PROVIDER_SITE_OTHER): Payer: Medicare Other | Admitting: Internal Medicine

## 2014-10-27 ENCOUNTER — Encounter: Payer: Self-pay | Admitting: Internal Medicine

## 2014-10-27 VITALS — BP 120/68 | HR 60 | Ht 64.0 in | Wt 121.0 lb

## 2014-10-27 DIAGNOSIS — I429 Cardiomyopathy, unspecified: Secondary | ICD-10-CM

## 2014-10-27 DIAGNOSIS — I10 Essential (primary) hypertension: Secondary | ICD-10-CM

## 2014-10-27 DIAGNOSIS — I428 Other cardiomyopathies: Secondary | ICD-10-CM

## 2014-10-27 LAB — BASIC METABOLIC PANEL
BUN: 18 mg/dL (ref 6–23)
CO2: 34 mEq/L — ABNORMAL HIGH (ref 19–32)
Calcium: 9.3 mg/dL (ref 8.4–10.5)
Chloride: 104 mEq/L (ref 96–112)
Creatinine, Ser: 0.86 mg/dL (ref 0.40–1.20)
GFR: 66.07 mL/min (ref >60.00)
Glucose, Bld: 97 mg/dL (ref 70–99)
POTASSIUM: 4.1 meq/L (ref 3.5–5.1)
Sodium: 141 mEq/L (ref 135–145)

## 2014-10-27 LAB — CBC WITH DIFFERENTIAL/PLATELET
BASOS ABS: 0 10*3/uL (ref 0.0–0.1)
BASOS PCT: 0.3 % (ref 0.0–3.0)
EOS ABS: 0.1 10*3/uL (ref 0.0–0.7)
EOS PCT: 1.2 % (ref 0.0–5.0)
HEMATOCRIT: 34.9 % — AB (ref 36.0–46.0)
HEMOGLOBIN: 11.7 g/dL — AB (ref 12.0–15.0)
Lymphocytes Relative: 31.7 % (ref 12.0–46.0)
Lymphs Abs: 1.6 10*3/uL (ref 0.7–4.0)
MCHC: 33.6 g/dL (ref 30.0–36.0)
MCV: 86.2 fl (ref 78.0–100.0)
Monocytes Absolute: 0.4 10*3/uL (ref 0.1–1.0)
Monocytes Relative: 7.6 % (ref 3.0–12.0)
Neutro Abs: 2.9 10*3/uL (ref 1.4–7.7)
Neutrophils Relative %: 59.2 % (ref 43.0–77.0)
Platelets: 199 10*3/uL (ref 150.0–400.0)
RBC: 4.05 Mil/uL (ref 3.87–5.11)
RDW: 14.4 % (ref 11.5–15.5)
WBC: 4.9 10*3/uL (ref 4.0–10.5)

## 2014-10-27 NOTE — Patient Instructions (Addendum)
Your physician recommends that you continue on your current medications as directed. Please refer to the Current Medication list given to you today.  Your physician recommends that you return for lab work in: [place], BMET, CBC W/DIFF  Your physician wants you to follow-up in: 06/2015 WITH DR. Harrington Challenger. You will receive a reminder letter in the mail two months in advance. If you don't receive a letter, please call our office to schedule the follow-up appointment.

## 2014-11-27 DIAGNOSIS — E1165 Type 2 diabetes mellitus with hyperglycemia: Secondary | ICD-10-CM | POA: Diagnosis not present

## 2015-01-05 NOTE — Op Note (Signed)
PATIENT NAME:  Christina, [name] MR#:  [id number] DATE OF BIRTH:  August 18, 1924  DATE OF PROCEDURE:  02/23/2013  PREOPERATIVE DIAGNOSIS:  Senile cataract left eye.  POSTOPERATIVE DIAGNOSIS:  Senile cataract left eye.  PROCEDURE:  Phacoemulsification with posterior chamber intraocular lens implantation of the left eye.  LENS: ZCBOO 24.0-diopter posterior chamber intraocular lens.  ULTRASOUND TIME:  14% of 2 minutes, 4 seconds.  CDE 16.9  SURGEON:  Mali Scot Shiraishi, MD  ANESTHESIA:  Topical with tetracaine drops and 2% Xylocaine jelly.  COMPLICATIONS:  None.  DESCRIPTION OF PROCEDURE:  The patient was identified in the holding room and transported to the operating room and placed in the supine position under the operating microscope.  The left eye was identified as the operative eye and it was prepped and draped in the usual sterile ophthalmic fashion.  A 1 millimeter clear-corneal paracentesis was made at the 1:30 position.  The anterior chamber was filled with Viscoat viscoelastic.  A 2.4 millimeter keratome was used to make a near-clear corneal incision at the 10:30 position.  A curvilinear capsulorrhexis was made with a cystotome and capsulorrhexis forceps.  Balanced salt solution was used to hydrodissect and hydrodelineate the nucleus.  Phacoemulsification was then used in stop and chop fashion to remove the lens nucleus and epinucleus.  The remaining cortex was then removed using the irrigation and aspiration handpiece. Provisc was then placed into the capsular bag to distend it for lens placement.  A ZCBOO 24.0-diopter lens was then injected into the capsular bag.  The remaining viscoelastic was aspirated.  Wounds were hydrated with balanced salt solution.  The anterior chamber was inflated to a physiologic pressure with balanced salt solution.  0.1 mL of cefuroxime 10 mg/mL were injected into the anterior chamber for a dose of 1 mg of intracameral antibiotic at the completion of the case. Miostat  was placed into the anterior chamber to constrict the pupil.  No wound leaks were noted.  Topical Polytrim drops and Maxitrol ointment were applied to the eye.  The patient was taken to the recovery room in stable condition without complications of anesthesia or surgery.   ____________________________ Wyonia Hough, MD crb:OSi D: 02/23/2013 12:37:16 ET T: 02/23/2013 14:48:28 ET JOB#: [id number]  cc: Wyonia Hough, MD, <Dictator> Leandrew Koyanagi MD ELECTRONICALLY SIGNED 03/02/2013 12:08

## 2015-01-15 ENCOUNTER — Ambulatory Visit (INDEPENDENT_AMBULATORY_CARE_PROVIDER_SITE_OTHER): Payer: Medicare Other | Admitting: *Deleted

## 2015-01-15 DIAGNOSIS — I429 Cardiomyopathy, unspecified: Secondary | ICD-10-CM | POA: Diagnosis not present

## 2015-01-15 DIAGNOSIS — I428 Other cardiomyopathies: Secondary | ICD-10-CM

## 2015-01-16 NOTE — Progress Notes (Signed)
Remote pacemaker transmission.   

## 2015-01-19 LAB — CUP PACEART REMOTE DEVICE CHECK
Battery Remaining Longevity: 36 mo
Battery Remaining Percentage: 52 %
Brady Statistic RV Percent Paced: 100 %
Lead Channel Impedance Value: 599 Ohm
Lead Channel Pacing Threshold Amplitude: 4 V
Lead Channel Pacing Threshold Pulse Width: 0.4 ms
Lead Channel Pacing Threshold Pulse Width: 1 ms
Lead Channel Setting Sensing Sensitivity: 2.5 mV
Lead Channel Setting Sensing Sensitivity: 2.5 mV
MDC IDC MSMT LEADCHNL RA IMPEDANCE VALUE: 420 Ohm
MDC IDC MSMT LEADCHNL RA PACING THRESHOLD AMPLITUDE: 0.6 V
MDC IDC MSMT LEADCHNL RV IMPEDANCE VALUE: 669 Ohm
MDC IDC PG SERIAL: 100410
MDC IDC SESS DTM: 20160502125500
MDC IDC SET LEADCHNL LV PACING AMPLITUDE: 4.5 V
MDC IDC SET LEADCHNL LV PACING PULSEWIDTH: 1 ms
MDC IDC SET LEADCHNL RA PACING AMPLITUDE: 2 V
MDC IDC SET LEADCHNL RV PACING AMPLITUDE: 2.4 V
MDC IDC SET LEADCHNL RV PACING PULSEWIDTH: 0.4 ms
MDC IDC STAT BRADY RA PERCENT PACED: 88 %
Zone Setting Detection Interval: 375 ms

## 2015-01-30 ENCOUNTER — Ambulatory Visit (INDEPENDENT_AMBULATORY_CARE_PROVIDER_SITE_OTHER): Payer: Medicare Other | Admitting: Podiatry

## 2015-01-30 DIAGNOSIS — B351 Tinea unguium: Secondary | ICD-10-CM

## 2015-01-30 DIAGNOSIS — M79676 Pain in unspecified toe(s): Secondary | ICD-10-CM | POA: Diagnosis not present

## 2015-01-30 DIAGNOSIS — E1140 Type 2 diabetes mellitus with diabetic neuropathy, unspecified: Secondary | ICD-10-CM

## 2015-01-30 NOTE — Progress Notes (Signed)
   Subjective:    Patient ID: [name], female    DOB: 11-Aug-1924, 81 y.o.   MRN: [id number]  HPI  "Patient presents today requesting B/L toenail trim, being seen by Dr Prudence Davidson.  HPI Presents today chief complaint of painful elongated toenails.  Objective: Pulses are palpable bilateral nails are thick, yellow dystrophic onychomycosis and painful palpation.   Assessment: Onychomycosis with pain in limb.  Plan: Treatment of nails in thickness and length as covered service secondary to pain.   Review of Systems  All other systems reviewed and are negative.      Objective:   Physical Exam        Assessment & Plan:

## 2015-02-05 ENCOUNTER — Encounter: Payer: Self-pay | Admitting: Cardiology

## 2015-02-13 ENCOUNTER — Encounter: Payer: Self-pay | Admitting: Internal Medicine

## 2015-02-24 ENCOUNTER — Other Ambulatory Visit: Payer: Self-pay | Admitting: Internal Medicine

## 2015-03-08 ENCOUNTER — Other Ambulatory Visit: Payer: Self-pay | Admitting: Family Medicine

## 2015-03-11 ENCOUNTER — Telehealth: Payer: Self-pay | Admitting: Family Medicine

## 2015-03-11 DIAGNOSIS — E559 Vitamin D deficiency, unspecified: Secondary | ICD-10-CM

## 2015-03-11 DIAGNOSIS — I10 Essential (primary) hypertension: Secondary | ICD-10-CM

## 2015-03-11 DIAGNOSIS — E785 Hyperlipidemia, unspecified: Secondary | ICD-10-CM

## 2015-03-11 NOTE — Telephone Encounter (Signed)
-----   Message from Ellamae Sia sent at 03/01/2015  2:33 PM EDT ----- Regarding: Lab orders for Monday, 6.27.16 Patient is scheduled for CPX labs, please order future labs, Thanks , Karna Christmas

## 2015-03-12 ENCOUNTER — Other Ambulatory Visit (INDEPENDENT_AMBULATORY_CARE_PROVIDER_SITE_OTHER): Payer: Medicare Other

## 2015-03-12 ENCOUNTER — Other Ambulatory Visit: Payer: Self-pay

## 2015-03-12 DIAGNOSIS — E785 Hyperlipidemia, unspecified: Secondary | ICD-10-CM | POA: Diagnosis not present

## 2015-03-12 DIAGNOSIS — E559 Vitamin D deficiency, unspecified: Secondary | ICD-10-CM | POA: Diagnosis not present

## 2015-03-12 DIAGNOSIS — I10 Essential (primary) hypertension: Secondary | ICD-10-CM | POA: Diagnosis not present

## 2015-03-12 LAB — CBC WITH DIFFERENTIAL/PLATELET
BASOS ABS: 0 10*3/uL (ref 0.0–0.1)
Basophils Relative: 0.4 % (ref 0.0–3.0)
EOS ABS: 0.1 10*3/uL (ref 0.0–0.7)
Eosinophils Relative: 1.7 % (ref 0.0–5.0)
HCT: 38.7 % (ref 36.0–46.0)
Hemoglobin: 12.7 g/dL (ref 12.0–15.0)
Lymphocytes Relative: 26.5 % (ref 12.0–46.0)
Lymphs Abs: 1.7 10*3/uL (ref 0.7–4.0)
MCHC: 32.9 g/dL (ref 30.0–36.0)
MCV: 90 fl (ref 78.0–100.0)
MONO ABS: 0.4 10*3/uL (ref 0.1–1.0)
Monocytes Relative: 5.8 % (ref 3.0–12.0)
NEUTROS PCT: 65.6 % (ref 43.0–77.0)
Neutro Abs: 4.3 10*3/uL (ref 1.4–7.7)
PLATELETS: 159 10*3/uL (ref 150.0–400.0)
RBC: 4.3 Mil/uL (ref 3.87–5.11)
RDW: 14.1 % (ref 11.5–15.5)
WBC: 6.6 10*3/uL (ref 4.0–10.5)

## 2015-03-12 LAB — LIPID PANEL
Cholesterol: 143 mg/dL (ref 0–200)
HDL: 42 mg/dL (ref >39.00)
LDL CALC: 83 mg/dL (ref 0–99)
NonHDL: 101
Total CHOL/HDL Ratio: 3
Triglycerides: 91 mg/dL (ref 0.0–149.0)
VLDL: 18.2 mg/dL (ref 0.0–40.0)

## 2015-03-12 LAB — COMPREHENSIVE METABOLIC PANEL
ALBUMIN: 4 g/dL (ref 3.5–5.2)
ALT: 5 U/L (ref 0–35)
AST: 11 U/L (ref 0–37)
Alkaline Phosphatase: 83 U/L (ref 39–117)
BUN: 17 mg/dL (ref 6–23)
CO2: 34 meq/L — AB (ref 19–32)
Calcium: 9.8 mg/dL (ref 8.4–10.5)
Chloride: 105 mEq/L (ref 96–112)
Creatinine, Ser: 0.76 mg/dL (ref 0.40–1.20)
GFR: 76.14 mL/min (ref >60.00)
GLUCOSE: 88 mg/dL (ref 70–99)
POTASSIUM: 4.3 meq/L (ref 3.5–5.1)
Sodium: 141 mEq/L (ref 135–145)
Total Bilirubin: 0.4 mg/dL (ref 0.2–1.2)
Total Protein: 6.9 g/dL (ref 6.0–8.3)

## 2015-03-12 LAB — VITAMIN D 25 HYDROXY (VIT D DEFICIENCY, FRACTURES): VITD: 20.36 ng/mL — ABNORMAL LOW (ref 30.00–100.00)

## 2015-03-12 LAB — TSH: TSH: 1.3 u[IU]/mL (ref 0.35–4.50)

## 2015-03-16 ENCOUNTER — Encounter: Payer: Self-pay | Admitting: Family Medicine

## 2015-03-16 ENCOUNTER — Ambulatory Visit (INDEPENDENT_AMBULATORY_CARE_PROVIDER_SITE_OTHER): Payer: Medicare Other | Admitting: Family Medicine

## 2015-03-16 VITALS — BP 124/66 | HR 60 | Temp 98.4°F | Wt 117.0 lb

## 2015-03-16 DIAGNOSIS — E559 Vitamin D deficiency, unspecified: Secondary | ICD-10-CM | POA: Diagnosis not present

## 2015-03-16 DIAGNOSIS — E785 Hyperlipidemia, unspecified: Secondary | ICD-10-CM

## 2015-03-16 DIAGNOSIS — M810 Age-related osteoporosis without current pathological fracture: Secondary | ICD-10-CM | POA: Diagnosis not present

## 2015-03-16 DIAGNOSIS — I10 Essential (primary) hypertension: Secondary | ICD-10-CM

## 2015-03-16 DIAGNOSIS — Z0000 Encounter for general adult medical examination without abnormal findings: Secondary | ICD-10-CM | POA: Diagnosis not present

## 2015-03-16 DIAGNOSIS — L989 Disorder of the skin and subcutaneous tissue, unspecified: Secondary | ICD-10-CM | POA: Insufficient documentation

## 2015-03-16 MED ORDER — SIMVASTATIN 20 MG PO TABS: 20.0000 mg | ORAL_TABLET | Freq: Every day | ORAL | Status: DC

## 2015-03-16 MED ORDER — ESOMEPRAZOLE MAGNESIUM 40 MG PO CPDR: 40.0000 mg | DELAYED_RELEASE_CAPSULE | Freq: Every day | ORAL | Status: DC

## 2015-03-16 MED ORDER — GABAPENTIN 300 MG PO CAPS: 600.0000 mg | ORAL_CAPSULE | Freq: Two times a day (BID) | ORAL | Status: DC

## 2015-03-16 MED ORDER — RAMIPRIL 10 MG PO CAPS: 10.0000 mg | ORAL_CAPSULE | Freq: Every day | ORAL | Status: DC

## 2015-03-16 NOTE — Progress Notes (Signed)
Subjective:    Patient ID: [name], female    DOB: 02-21-1924, 81 y.o.   MRN: [id number]  HPI Here for annual medicare wellness visit as well as chronic/acute medical problems as well as annual preventative exam  I have personally reviewed the Medicare Annual Wellness questionnaire and have noted 1. The patient's medical and social history 2. Their use of alcohol, tobacco or illicit drugs 3. Their current medications and supplements 4. The patient's functional ability including ADL's, fall risks, home safety risks and hearing or visual             impairment. 5. Diet and physical activities 6. Evidence for depression or mood disorders  The patients weight, height, BMI have been recorded in the chart and visual acuity is per eye clinic.  I have made referrals, counseling and provided education to the patient based review of the above and I have provided the pt with a written personalized care plan for preventive services. Reviewed and updated provider list, see scanned forms.  Biggest c/o is congestion and rhinorrhea - uses nasal spray  Otherwise no complaints   See scanned forms.  Routine anticipatory guidance given to patient.  See health maintenance. Colon cancer screening 6/10 colonoscopy , declines ifob card  Breast cancer screening mammogram -not interested in at her age  Self breast exam-no lumps  Flu vaccine 9/15 Tetanus vaccine 12/13 Pneumovax 6/15-is completed  Zoster vaccine-6/15  dexa 7/15 with OP, vit D level of 20 (1000 iu daily in addn to her ca), no falls or fractures , declines fosamax because she already has trouble swallowing anyway (usually due to posture)  Advance directive-has a living will and POA  Cognitive function addressed- see scanned forms- and if abnormal then additional documentation follows. - memory is worsening as she gets older/ short term memory  Her daughter does everything for her  No accidents/ does not get agitated  Not lost - but occ  does not recognize things or people  Does not get out much   PMH and SH reviewed  Meds, vitals, and allergies reviewed.   ROS: See HPI.  Otherwise negative.    Sleeps a lot - cooks what she is able to eat /not under any stress   4lb wt gain with bmi of 20   bp is stable today  No cp or palpitations or headaches or edema  No side effects to medicines  BP Readings from Last 3 Encounters:  03/16/15 124/66  10/27/14 120/68  07/13/14 158/50        Chemistry      Component Value Date/Time   NA 141 03/12/2015 0833   K 4.3 03/12/2015 0833   CL 105 03/12/2015 0833   CO2 34* 03/12/2015 0833   BUN 17 03/12/2015 0833   CREATININE 0.76 03/12/2015 0833      Component Value Date/Time   CALCIUM 9.8 03/12/2015 0833   ALKPHOS 83 03/12/2015 0833   AST 11 03/12/2015 0833   ALT 5 03/12/2015 0833   BILITOT 0.4 03/12/2015 0833       Sees endocrinology for DM- has stopped insulin alltogether Has f/u this month  Glucose control is excellent Last eye exam 2/16   Cholesterol Statin and diet  Lab Results  Component Value Date   CHOL 143 03/12/2015   CHOL 126 03/07/2014   CHOL 141 08/30/2013   Lab Results  Component Value Date   HDL 42.00 03/12/2015   HDL 36.80* 03/07/2014   HDL 31.90* 08/30/2013  Lab Results  Component Value Date   LDLCALC 83 03/12/2015   LDLCALC 63 03/07/2014   LDLCALC 69 09/06/2012   Lab Results  Component Value Date   TRIG 91.0 03/12/2015   TRIG 130.0 03/07/2014   TRIG 337.0* 08/30/2013   Lab Results  Component Value Date   CHOLHDL 3 03/12/2015   CHOLHDL 3 03/07/2014   CHOLHDL 4 08/30/2013   Lab Results  Component Value Date   LDLDIRECT 79.3 08/30/2013   LDLDIRECT 63.2 02/20/2011   LDLDIRECT 75.3 08/26/2010   still good control with cholesterol  Lab Results  Component Value Date   WBC 6.6 03/12/2015   HGB 12.7 03/12/2015   HCT 38.7 03/12/2015   MCV 90.0 03/12/2015   PLT 159.0 03/12/2015    Lab Results  Component Value Date   TSH  1.30 03/12/2015    Patient Active Problem List   Diagnosis Date Noted  . Encounter for Medicare annual wellness exam 03/16/2015  . Routine general medical examination at a health care facility 03/16/2015  . Dyspnea on exertion 06/17/2013  . Complete heart block 07/15/2012  . [employer] 07/07/2012  . Vitamin D deficiency disease 08/27/2011  . Spell of transient neurologic symptoms 06/20/2011  . External otitis 05/13/2011  . GERD (gastroesophageal reflux disease) 02/25/2011  . CARDIOMYOPATHY, SECONDARY 01/29/2009  . AV BLOCK, COMPLETE 01/29/2009  . IRRITABLE BOWEL SYNDROME 01/29/2009  . ANEURYSM, SPLENIC ARTERY 01/25/2009  . COLONIC POLYPS, ADENOMATOUS, HX OF 01/25/2009  . DIABETES MELLITUS, TYPE II 12/28/2006  . Hyperlipidemia 12/28/2006  . GOUT 12/28/2006  . Essential hypertension 12/28/2006  . CORONARY ARTERY DISEASE 12/28/2006  . ALLERGIC RHINITIS 12/28/2006  . OSTEOARTHRITIS 12/28/2006  . Osteoporosis 12/28/2006   Past Medical History  Diagnosis Date  . Diabetes mellitus     Type II  . Hypertension   . Gout   . Allergy     allergic rhinitis  . CAD (coronary artery disease)     pulmonary hypertension// Cardiac cath 2001//2004 carotid dopplers Normal  . CHF (congestive heart failure)     non ish. cardiomyopathy// Dr. Harrington Challenger cardiologist  . Cancer 2002    Renal cell cancer partial nephrectomy  . GERD (gastroesophageal reflux disease) 2003    EGD polyp; gastritis HH//Dr. Carlean Purl GI  . Diverticula of intestine 2003    colonoscopy polyp; hem; divertic  . Cataract 2008    Right eye  . Personal history of kidney stones   . Hyperlipidemia     TRIG  . Osteopenia 2002    Dexa  . IBS (irritable bowel syndrome)   . Choledocholithiasis   . Transfusion history 1982    PUD/Bleed/Transfusion  . Osteoarthritis 07/12/2007/ 10/2004    spine and knees// LS Degen. changes   . Chronic kidney disease 05/2006    Renal failure ; hospitalized   Past Surgical  History  Procedure Laterality Date  . Eye surgery  2008    Right eye cataract  . Cystoscopy w/ stone manipulation  2010- 2011    ERCPS and clearing of choledocholithiasis  . Ercp  2010  . Colonoscopy  09/2006    polyps  . Abdominal hysterectomy  1999    hysterectomy with bladder tack   History  Substance Use Topics  . Smoking status: Never Smoker   . Smokeless tobacco: Not on file  . Alcohol Use: No   Family History  Problem Relation Age of Onset  . Cancer Daughter     breast cancer  . Cancer Son 62  Esophageal Cancer  . Heart disease Daughter    Allergies  Allergen Reactions  . Aspirin     REACTION: stomach upset  . Atorvastatin     REACTION: muscle pain  . Celecoxib     REACTION: stomach upset  . Fenofibrate     REACTION: muscle pain  . Metformin     REACTION: diarrhea  . Moxifloxacin     REACTION: stomach upset  . Niacin     REACTION: muscle pain   Current Outpatient Prescriptions on File Prior to Visit  Medication Sig Dispense Refill  . acetaminophen (TYLENOL) 325 MG tablet OTC as directed     . amLODipine (NORVASC) 5 MG tablet Take 1 tablet (5 mg total) by mouth 2 (two) times daily. 60 tablet 12  . BD ULTRA-FINE LANCETS lancets Use to check glucose once daily and in the evening     . carvedilol (COREG) 25 MG tablet take 1 tablet by mouth twice a day WITH MEALS 60 tablet 3  . Cholecalciferol (VITAMIN D3) 1000 UNITS CAPS Take 1,000 Units by mouth daily.      Marland Kitchen dicyclomine (BENTYL) 10 MG capsule take 1 capsule by mouth three times a day 90 capsule 11  . dorzolamide-timolol (COSOPT) 22.3-6.8 MG/ML ophthalmic solution Place 1 drop into the left eye 2 (two) times daily. As directed.    . gabapentin (NEURONTIN) 300 MG capsule take 2 capsules by mouth twice a day 120 capsule 0  . Glucose Blood (BL TEST STRIP PACK VI)     . glucose blood test strip Test one time a day as directed    . latanoprost (XALATAN) 0.005 % ophthalmic solution 1 drop at bedtime.    Marland Kitchen  loperamide (LOPERAMIDE A-D) 2 MG tablet As needed     . NEXIUM 40 MG capsule take 1 capsule by mouth once daily 30 capsule 0  . ramipril (ALTACE) 10 MG capsule take 1 capsule by mouth once daily 30 capsule 0  . simvastatin (ZOCOR) 20 MG tablet take 1 tablet by mouth once daily 30 tablet 0   No current facility-administered medications on file prior to visit.      Review of Systems Review of Systems  Constitutional: Negative for fever, appetite change, fatigue and unexpected weight change.  Eyes: Negative for pain and visual disturbance.  Respiratory: Negative for cough and shortness of breath.   Cardiovascular: Negative for cp or palpitations    Gastrointestinal: Negative for nausea, diarrhea and constipation.  Genitourinary: Negative for urgency and frequency.  Skin: Negative for pallor or rash   MSK pos for aches/pains/impaired mobility due to OA - uses walker and daughter assists gait  Neurological: Negative for weakness, light-headedness, numbness and headaches.  Hematological: Negative for adenopathy. Does not bruise/bleed easily.  Psychiatric/Behavioral: Negative for dysphoric mood. The patient is not nervous/anxious.  pos for some senile dementia        Objective:   Physical Exam  Constitutional: She appears well-developed and well-nourished. No distress.  Frail appearing elderly female   HENT:  Head: Normocephalic and atraumatic.  Right Ear: External ear normal.  Left Ear: External ear normal.  Mouth/Throat: Oropharynx is clear and moist.  Eyes: Conjunctivae and EOM are normal. Pupils are equal, round, and reactive to light. No scleral icterus.  Neck: Normal range of motion. Neck supple. No JVD present. Carotid bruit is not present. No thyromegaly present.  Cardiovascular: Normal rate, regular rhythm, normal heart sounds and intact distal pulses.  Exam reveals no gallop.  Pulmonary/Chest: Effort normal and breath sounds normal. No respiratory distress. She has no  wheezes. She exhibits no tenderness.  Abdominal: Soft. Bowel sounds are normal. She exhibits no distension, no abdominal bruit and no mass. There is no tenderness.  Genitourinary: No breast swelling, tenderness, discharge or bleeding.  Breast exam: No mass, nodules, thickening, tenderness, bulging, retraction, inflamation, nipple discharge or skin changes noted.  No axillary or clavicular LA.      Musculoskeletal: Normal range of motion. She exhibits no edema or tenderness.  Mild kyphosis   Lymphadenopathy:    She has no cervical adenopathy.  Neurological: She is alert. She has normal reflexes. No cranial nerve deficit. She exhibits normal muscle tone. Coordination normal.  Skin: Skin is warm and dry. No rash noted. No erythema. No pallor.  Keratotic abraded lesion 5 mm L forearm  SKs diffusely Solar aging/ lentigo  Psychiatric: She has a normal mood and affect.  Very pleasant Repeats statements frequently          Assessment & Plan:   Problem List Items Addressed This Visit    Encounter for Medicare annual wellness exam - Primary    Reviewed health habits including diet and exercise and skin cancer prevention Reviewed appropriate screening tests for age  Also reviewed health mt list, fam hx and immunization status , as well as social and family history   See HPI Lab reviewed  Will inc D to 2000 iu daily  Declines cancer screening at her age  F/u with endo for DM      Essential hypertension    bp in fair control at this time  BP Readings from Last 1 Encounters:  03/16/15 124/66   No changes needed Disc lifstyle change with low sodium diet and exercise  Labs reviewed       Relevant Medications   ramipril (ALTACE) 10 MG capsule   simvastatin (ZOCOR) 20 MG tablet   Hyperlipidemia    Disc goals for lipids and reasons to control them Rev labs with pt Rev low sat fat diet in detail Continue zocor and diet       Relevant Medications   ramipril (ALTACE) 10 MG capsule    simvastatin (ZOCOR) 20 MG tablet   Osteoporosis    dexa reviewed Disc need for calcium/ vitamin D/ wt bearing exercise and bone density test every 2 y to monitor Disc safety/ fracture risk in detail   Does not want to take medication Will inc vit D to 2000 iu daily       Routine general medical examination at a health care facility    Reviewed health habits including diet and exercise and skin cancer prevention Reviewed appropriate screening tests for age  Also reviewed health mt list, fam hx and immunization status , as well as social and family history   See HPI Labs reviewed inst to inc D to 2000 iu daily  Declines cancer screening at her age  Continue endocrine f/u for DM- now off insulin      Skin lesion of left arm    Keratotic and abraded  ? SK vs squamous cell lesion Ref to derm for eval and tx       Relevant Orders   Ambulatory referral to Dermatology   Vitamin D deficiency disease    Not at goal Please inc vit D intake to 2000 iu daily  Disc imp to bone and overall health

## 2015-03-16 NOTE — Progress Notes (Signed)
Pre visit review using our clinic review tool, if applicable. No additional management support is needed unless otherwise documented below in the visit note. 

## 2015-03-16 NOTE — Patient Instructions (Addendum)
Increase vitamin D to 2000 iu daily  Stop at check out for dermatology referral  Labs are stable  Stay active physically and mentally also keep reading and doing word searching and stay as social as you can be

## 2015-03-19 NOTE — Assessment & Plan Note (Signed)
Not at goal Please inc vit D intake to 2000 iu daily  Disc imp to bone and overall health

## 2015-03-19 NOTE — Assessment & Plan Note (Signed)
Disc goals for lipids and reasons to control them Rev labs with pt Rev low sat fat diet in detail Continue zocor and diet  

## 2015-03-19 NOTE — Assessment & Plan Note (Signed)
Reviewed health habits including diet and exercise and skin cancer prevention Reviewed appropriate screening tests for age  Also reviewed health mt list, fam hx and immunization status , as well as social and family history   See HPI Labs reviewed inst to inc D to 2000 iu daily  Declines cancer screening at her age  Continue endocrine f/u for DM- now off insulin

## 2015-03-19 NOTE — Assessment & Plan Note (Signed)
Reviewed health habits including diet and exercise and skin cancer prevention Reviewed appropriate screening tests for age  Also reviewed health mt list, fam hx and immunization status , as well as social and family history   See HPI Lab reviewed  Will inc D to 2000 iu daily  Declines cancer screening at her age  F/u with endo for DM

## 2015-03-19 NOTE — Assessment & Plan Note (Signed)
Keratotic and abraded  ? SK vs squamous cell lesion Ref to derm for eval and tx

## 2015-03-19 NOTE — Assessment & Plan Note (Signed)
dexa reviewed Disc need for calcium/ vitamin D/ wt bearing exercise and bone density test every 2 y to monitor Disc safety/ fracture risk in detail   Does not want to take medication Will inc vit D to 2000 iu daily

## 2015-03-19 NOTE — Assessment & Plan Note (Signed)
bp in fair control at this time  BP Readings from Last 1 Encounters:  03/16/15 124/66   No changes needed Disc lifstyle change with low sodium diet and exercise  Labs reviewed

## 2015-03-21 ENCOUNTER — Other Ambulatory Visit: Payer: Self-pay | Admitting: Family Medicine

## 2015-04-09 ENCOUNTER — Other Ambulatory Visit: Payer: Self-pay | Admitting: Dermatology

## 2015-04-09 DIAGNOSIS — C44629 Squamous cell carcinoma of skin of left upper limb, including shoulder: Secondary | ICD-10-CM | POA: Diagnosis not present

## 2015-04-10 DIAGNOSIS — E1165 Type 2 diabetes mellitus with hyperglycemia: Secondary | ICD-10-CM | POA: Diagnosis not present

## 2015-04-16 ENCOUNTER — Ambulatory Visit (INDEPENDENT_AMBULATORY_CARE_PROVIDER_SITE_OTHER): Payer: Medicare Other | Admitting: *Deleted

## 2015-04-16 DIAGNOSIS — I442 Atrioventricular block, complete: Secondary | ICD-10-CM

## 2015-04-18 NOTE — Progress Notes (Signed)
Remote pacemaker transmission.   

## 2015-04-24 ENCOUNTER — Ambulatory Visit: Payer: Medicare Other | Admitting: Podiatry

## 2015-04-26 DIAGNOSIS — H4011X4 Primary open-angle glaucoma, indeterminate stage: Secondary | ICD-10-CM | POA: Diagnosis not present

## 2015-04-28 LAB — CUP PACEART REMOTE DEVICE CHECK
Battery Remaining Longevity: 30 mo
Battery Remaining Percentage: 46 %
Brady Statistic RV Percent Paced: 100 %
Date Time Interrogation Session: 20160801121700
Lead Channel Impedance Value: 418 Ohm
Lead Channel Impedance Value: 628 Ohm
Lead Channel Pacing Threshold Pulse Width: 1 ms
Lead Channel Setting Pacing Amplitude: 2 V
Lead Channel Setting Pacing Pulse Width: 0.4 ms
Lead Channel Setting Pacing Pulse Width: 1 ms
Lead Channel Setting Sensing Sensitivity: 2.5 mV
MDC IDC MSMT LEADCHNL LV IMPEDANCE VALUE: 594 Ohm
MDC IDC MSMT LEADCHNL LV PACING THRESHOLD AMPLITUDE: 4 V
MDC IDC PG SERIAL: 100410
MDC IDC SET LEADCHNL LV PACING AMPLITUDE: 4.5 V
MDC IDC SET LEADCHNL LV SENSING SENSITIVITY: 2.5 mV
MDC IDC SET LEADCHNL RV PACING AMPLITUDE: 2.4 V
MDC IDC STAT BRADY RA PERCENT PACED: 89 %
Zone Setting Detection Interval: 375 ms

## 2015-04-30 ENCOUNTER — Ambulatory Visit (INDEPENDENT_AMBULATORY_CARE_PROVIDER_SITE_OTHER): Payer: Medicare Other | Admitting: Podiatry

## 2015-04-30 DIAGNOSIS — B351 Tinea unguium: Secondary | ICD-10-CM

## 2015-04-30 DIAGNOSIS — M79673 Pain in unspecified foot: Secondary | ICD-10-CM | POA: Diagnosis not present

## 2015-04-30 DIAGNOSIS — E1140 Type 2 diabetes mellitus with diabetic neuropathy, unspecified: Secondary | ICD-10-CM

## 2015-04-30 NOTE — Progress Notes (Signed)
   Subjective:    Patient ID: [name], female    DOB: [birth year], 81 y.o.   MRN: [id number]  HPI  "Patient presents today requesting B/L toenail trim.  HPI Presents today chief complaint of painful elongated toenails.  Objective: Pulses are palpable bilateral nails are thick, yellow dystrophic onychomycosis and painful palpation.   Assessment: Onychomycosis 1-5 nails B with pain in limb.  Plan: Treatment of nails 1-5 B in thickness and length as covered service secondary to pain. 3 mo.   Review of Systems  All other systems reviewed and are negative.      Objective:   Physical Exam        Assessment & Plan:

## 2015-05-15 ENCOUNTER — Ambulatory Visit (INDEPENDENT_AMBULATORY_CARE_PROVIDER_SITE_OTHER): Payer: Medicare Other

## 2015-05-15 DIAGNOSIS — Z23 Encounter for immunization: Secondary | ICD-10-CM | POA: Diagnosis not present

## 2015-05-17 ENCOUNTER — Encounter: Payer: Self-pay | Admitting: Internal Medicine

## 2015-05-25 ENCOUNTER — Encounter: Payer: Self-pay | Admitting: *Deleted

## 2015-06-05 ENCOUNTER — Encounter: Payer: Self-pay | Admitting: Internal Medicine

## 2015-06-13 ENCOUNTER — Other Ambulatory Visit: Payer: Self-pay | Admitting: Internal Medicine

## 2015-07-17 ENCOUNTER — Encounter: Payer: Self-pay | Admitting: Internal Medicine

## 2015-07-17 ENCOUNTER — Ambulatory Visit (INDEPENDENT_AMBULATORY_CARE_PROVIDER_SITE_OTHER): Payer: Medicare Other | Admitting: Internal Medicine

## 2015-07-17 VITALS — BP 156/62 | HR 60 | Ht 62.0 in | Wt 114.0 lb

## 2015-07-17 DIAGNOSIS — I10 Essential (primary) hypertension: Secondary | ICD-10-CM | POA: Diagnosis not present

## 2015-07-17 DIAGNOSIS — I442 Atrioventricular block, complete: Secondary | ICD-10-CM

## 2015-07-17 MED ORDER — AMLODIPINE BESYLATE 5 MG PO TABS: 5.0000 mg | ORAL_TABLET | Freq: Two times a day (BID) | ORAL | Status: DC

## 2015-07-17 NOTE — Progress Notes (Signed)
Patient Care Team: Abner Greenspan, MD as PCP - General   HPI  [name] is a 81 y.o. female  is seen in followup of NICM (now normal functionby echo September 2011), s/p CRT-D >>CRT-P   She is inceasingly  demented.   She has no complaints; her daughter notes that she is short of breath. There has been no edema. There've been no specific complaints of chest pain.    Past Medical History  Diagnosis Date  . Diabetes mellitus     Type II  . Hypertension   . Gout   . Allergy     allergic rhinitis  . CAD (coronary artery disease)     pulmonary hypertension// Cardiac cath 2001//2004 carotid dopplers Normal  . CHF (congestive heart failure) ([place])     non ish. cardiomyopathy// Dr. Harrington Challenger cardiologist  . Cancer [facility]) 2002    Renal cell cancer partial nephrectomy  . GERD (gastroesophageal reflux disease) 2003    EGD polyp; gastritis HH//Dr. Carlean Purl GI  . Diverticula of intestine 2003    colonoscopy polyp; hem; divertic  . Cataract 2008    Right eye  . Personal history of kidney stones   . Hyperlipidemia     TRIG  . Osteopenia 2002    Dexa  . IBS (irritable bowel syndrome)   . Choledocholithiasis   . Transfusion history 1982    PUD/Bleed/Transfusion  . Osteoarthritis 07/12/2007/ 10/2004    spine and knees// LS Degen. changes   . Chronic kidney disease 05/2006    Renal failure ; hospitalized    Past Surgical History  Procedure Laterality Date  . Eye surgery  2008    Right eye cataract  . Cystoscopy w/ stone manipulation  2010- 2011    ERCPS and clearing of choledocholithiasis  . Ercp  2010  . Colonoscopy  09/2006    polyps  . Abdominal hysterectomy  1999    hysterectomy with bladder tack    Current Outpatient Prescriptions  Medication Sig Dispense Refill  . acetaminophen (TYLENOL) 325 MG tablet Take 650 mg by mouth every 6 (six) hours as needed for headache.     . carvedilol (COREG) 25 MG tablet take 1 tablet by mouth twice a day with food 60 tablet 3  .  Cholecalciferol (VITAMIN D3) 2000 UNITS TABS Take 1 tablet by mouth daily.    Marland Kitchen dicyclomine (BENTYL) 10 MG capsule take 1 capsule by mouth three times a day 90 capsule 5  . dorzolamide-timolol (COSOPT) 22.3-6.8 MG/ML ophthalmic solution Place 1 drop into the left eye 2 (two) times daily. As directed.    Marland Kitchen esomeprazole (NEXIUM) 40 MG capsule Take 1 capsule (40 mg total) by mouth daily. 30 capsule 11  . gabapentin (NEURONTIN) 300 MG capsule Take 2 capsules (600 mg total) by mouth 2 (two) times daily. 120 capsule 11  . latanoprost (XALATAN) 0.005 % ophthalmic solution Place 1 drop into the left eye at bedtime.     Marland Kitchen loperamide (LOPERAMIDE A-D) 2 MG tablet Take 2 mg by mouth 3 (three) times daily as needed for diarrhea or loose stools. As needed    . ramipril (ALTACE) 10 MG capsule Take 1 capsule (10 mg total) by mouth daily. 30 capsule 11  . simvastatin (ZOCOR) 20 MG tablet Take 1 tablet (20 mg total) by mouth daily. 30 tablet 11  . amLODipine (NORVASC) 5 MG tablet Take 1 tablet (5 mg total) by mouth 2 (two) times daily. 60 tablet 11   No current  facility-administered medications for this visit.    Allergies  Allergen Reactions  . Aspirin     REACTION: stomach upset  . Atorvastatin     REACTION: muscle pain  . Celecoxib     REACTION: stomach upset  . Fenofibrate     REACTION: muscle pain  . Metformin     REACTION: diarrhea  . Moxifloxacin     REACTION: stomach upset  . Niacin     REACTION: muscle pain    Review of Systems negative except from HPI and PMH  Physical Exam BP 156/62 mmHg  Pulse 60  Ht 5\' 2"  (1.575 m)  Wt 114 lb (51.71 kg)  BMI 20.85 kg/m2 Well developed and well nourished in no acute distress HENT normal E scleral and icterus clear Neck Supple JVP flat; carotids brisk and full Clear to ausculation Device pocket well healed; without hematoma or erythema.  There is no tethering regular rate and rhythm, no murmurs gallops or rub Soft with active bowel sounds No  clubbing cyanosis none Edema Alert and oriented, grossly normal motor and sensory function Skin Warm and Dry    Assessment and  Plan  Complete heart block stable pst pacing  Pacemaker-CRT The patient's device was interrogated.  The information was reviewed. No changes were made in the programming.     Ischemic cardiomyopathy Without symptoms of ischemia   Chronic systolic heart failure  Dementia   The patient is stable. We will continue her current medications.

## 2015-07-17 NOTE — Patient Instructions (Signed)
Medication Instructions: - no changes  Labwork: - none  Procedures/Testing: - none  Follow-Up: - Remote monitoring is used to monitor your Pacemaker of ICD from home. This monitoring reduces the number of office visits required to check your device to one time per year. It allows [place] to keep an eye on the functioning of your device to ensure it is working properly. You are scheduled for a device check from home on 10/16/15. You may send your transmission at any time that day. If you have a wireless device, the transmission will be sent automatically. After your physician reviews your transmission, you will receive a postcard with your next transmission date.  - Your physician wants you to follow-up in: 1 year with Dr. Caryl Comes. You will receive a reminder letter in the mail two months in advance. If you don't receive a letter, please call our office to schedule the follow-up appointment.  Any Additional Special Instructions Will Be Listed Below (If Applicable).

## 2015-07-20 LAB — CUP PACEART INCLINIC DEVICE CHECK
Brady Statistic RV Percent Paced: 100 %
Date Time Interrogation Session: 20161104151131
Implantable Lead Implant Date: 20030715
Implantable Lead Implant Date: 20050112
Implantable Lead Location: 753860
Implantable Lead Model: 158
Implantable Lead Model: 4518
Lead Channel Impedance Value: 565 Ohm
Lead Channel Pacing Threshold Amplitude: 0.6 V
Lead Channel Pacing Threshold Amplitude: 3 V
Lead Channel Pacing Threshold Pulse Width: 0.4 ms
Lead Channel Pacing Threshold Pulse Width: 1 ms
Lead Channel Setting Pacing Amplitude: 2 V
Lead Channel Setting Pacing Amplitude: 2.4 V
Lead Channel Setting Pacing Amplitude: 4.5 V
Lead Channel Setting Pacing Pulse Width: 0.4 ms
Lead Channel Setting Pacing Pulse Width: 1 ms
MDC IDC LEAD IMPLANT DT: 20030715
MDC IDC LEAD LOCATION: 753858
MDC IDC LEAD LOCATION: 753859
MDC IDC LEAD SERIAL: 115184
MDC IDC LEAD SERIAL: 308576
MDC IDC MSMT BATTERY REMAINING LONGEVITY: 30 mo
MDC IDC MSMT LEADCHNL RA IMPEDANCE VALUE: 426 Ohm
MDC IDC MSMT LEADCHNL RA PACING THRESHOLD AMPLITUDE: 0.6 V
MDC IDC MSMT LEADCHNL RA PACING THRESHOLD PULSEWIDTH: 0.4 ms
MDC IDC MSMT LEADCHNL RA SENSING INTR AMPL: 3.6 mV
MDC IDC MSMT LEADCHNL RV IMPEDANCE VALUE: 510 Ohm
MDC IDC SET LEADCHNL LV SENSING SENSITIVITY: 2.5 mV
MDC IDC SET LEADCHNL RV SENSING SENSITIVITY: 2.5 mV
MDC IDC STAT BRADY RA PERCENT PACED: 91 %
Pulse Gen Serial Number: 100410

## 2015-07-30 ENCOUNTER — Ambulatory Visit (INDEPENDENT_AMBULATORY_CARE_PROVIDER_SITE_OTHER): Payer: Medicare Other | Admitting: Podiatry

## 2015-07-30 DIAGNOSIS — M79676 Pain in unspecified toe(s): Secondary | ICD-10-CM | POA: Diagnosis not present

## 2015-07-30 DIAGNOSIS — B351 Tinea unguium: Secondary | ICD-10-CM

## 2015-07-30 NOTE — Progress Notes (Signed)
S: Diabetic returns noting thick/painful toenails which she has difficulty trimming.  O: Exam reveals multiple thick/dystrophic /mycotic nails tender to pressure. Vascular status intact.  A: Multiple mycotic tender nails 1-5 bilateral  P: Debridement of thick/painful/mycotic nails 1-5 bilateral. 3 mo.   for followup.  Roselind Messier DPM

## 2015-08-10 ENCOUNTER — Ambulatory Visit: Payer: Medicare Other | Admitting: Internal Medicine

## 2015-08-13 ENCOUNTER — Encounter: Payer: Self-pay | Admitting: Internal Medicine

## 2015-08-13 ENCOUNTER — Ambulatory Visit (INDEPENDENT_AMBULATORY_CARE_PROVIDER_SITE_OTHER): Payer: Medicare Other | Admitting: Internal Medicine

## 2015-08-13 VITALS — BP 122/58 | HR 60 | Ht 62.0 in | Wt 115.0 lb

## 2015-08-13 DIAGNOSIS — I10 Essential (primary) hypertension: Secondary | ICD-10-CM | POA: Diagnosis not present

## 2015-08-13 DIAGNOSIS — I429 Cardiomyopathy, unspecified: Secondary | ICD-10-CM | POA: Diagnosis not present

## 2015-08-13 DIAGNOSIS — I428 Other cardiomyopathies: Secondary | ICD-10-CM

## 2015-08-13 LAB — BASIC METABOLIC PANEL
BUN: 22 mg/dL (ref 7–25)
CALCIUM: 9.2 mg/dL (ref 8.6–10.4)
CHLORIDE: 108 mmol/L (ref 98–110)
CO2: 31 mmol/L (ref 20–31)
CREATININE: 0.71 mg/dL (ref 0.60–0.88)
Glucose, Bld: 112 mg/dL — ABNORMAL HIGH (ref 65–99)
Potassium: 4.4 mmol/L (ref 3.5–5.3)
SODIUM: 144 mmol/L (ref 135–146)

## 2015-08-13 LAB — CBC WITH DIFFERENTIAL/PLATELET
BASOS ABS: 0 10*3/uL (ref 0.0–0.1)
BASOS PCT: 0 % (ref 0–1)
EOS ABS: 0.1 10*3/uL (ref 0.0–0.7)
EOS PCT: 1 % (ref 0–5)
HCT: 34.2 % — ABNORMAL LOW (ref 36.0–46.0)
Hemoglobin: 11.5 g/dL — ABNORMAL LOW (ref 12.0–15.0)
LYMPHS PCT: 19 % (ref 12–46)
Lymphs Abs: 1.3 10*3/uL (ref 0.7–4.0)
MCH: 29.7 pg (ref 26.0–34.0)
MCHC: 33.6 g/dL (ref 30.0–36.0)
MCV: 88.4 fL (ref 78.0–100.0)
MPV: 9.9 fL (ref 8.6–12.4)
Monocytes Absolute: 0.3 10*3/uL (ref 0.1–1.0)
Monocytes Relative: 4 % (ref 3–12)
NEUTROS ABS: 5.2 10*3/uL (ref 1.7–7.7)
Neutrophils Relative %: 76 % (ref 43–77)
PLATELETS: 151 10*3/uL (ref 150–400)
RBC: 3.87 MIL/uL (ref 3.87–5.11)
RDW: 14 % (ref 11.5–15.5)
WBC: 6.8 10*3/uL (ref 4.0–10.5)

## 2015-08-13 NOTE — Patient Instructions (Signed)
Medication Instructions:  No changes today  Labwork: BMET/CBCd today  Testing/Procedures: None   Follow-Up: Your physician wants you to follow-up in: July 2017 with Dr Dorris Carnes.  You will receive a reminder letter in the mail two months in advance. If you don't receive a letter, please call our office to schedule the follow-up appointment.        If you need a refill on your cardiac medications before your next appointment, please call your pharmacy.

## 2015-08-13 NOTE — Progress Notes (Signed)
Cardiology Office Note   Date:  08/13/2015   ID:  Lavonya, Langer 08/23/1924, MRN [id number]  PCP:  Loura Pardon, MD  Cardiologist:   Dorris Carnes, MD   No chief complaint on file.     History of Present Illness: AVIENDHA HIRANO is a 81 y.o. female with a history of  NICM (s/p CRT P) Repeat eval witn normal LV function. Also a history of HL and HTN.  She is s/p CRTP  Seen by Olin Pia in early November   Since I saw her breating is fairly stable  No CP   Memory is decreasing Eating but wt down.      Current Outpatient Prescriptions  Medication Sig Dispense Refill  . acetaminophen (TYLENOL) 325 MG tablet Take 650 mg by mouth every 6 (six) hours as needed for headache.     Marland Kitchen amLODipine (NORVASC) 5 MG tablet Take 1 tablet (5 mg total) by mouth 2 (two) times daily. 60 tablet 11  . carvedilol (COREG) 25 MG tablet take 1 tablet by mouth twice a day with food 60 tablet 3  . Cholecalciferol (VITAMIN D3) 2000 UNITS TABS Take 1 tablet by mouth daily.    Marland Kitchen dicyclomine (BENTYL) 10 MG capsule take 1 capsule by mouth three times a day 90 capsule 5  . dorzolamide-timolol (COSOPT) 22.3-6.8 MG/ML ophthalmic solution Place 1 drop into the left eye 2 (two) times daily. As directed.    Marland Kitchen esomeprazole (NEXIUM) 40 MG capsule Take 1 capsule (40 mg total) by mouth daily. 30 capsule 11  . gabapentin (NEURONTIN) 300 MG capsule Take 2 capsules (600 mg total) by mouth 2 (two) times daily. 120 capsule 11  . latanoprost (XALATAN) 0.005 % ophthalmic solution Place 1 drop into the left eye at bedtime.     Marland Kitchen loperamide (LOPERAMIDE A-D) 2 MG tablet Take 2 mg by mouth 3 (three) times daily as needed for diarrhea or loose stools. As needed    . ramipril (ALTACE) 10 MG capsule Take 1 capsule (10 mg total) by mouth daily. 30 capsule 11  . simvastatin (ZOCOR) 20 MG tablet Take 1 tablet (20 mg total) by mouth daily. 30 tablet 11   No current facility-administered medications for this visit.    Allergies:   Aspirin;  Atorvastatin; Celecoxib; Fenofibrate; Metformin; Moxifloxacin; and Niacin   Past Medical History  Diagnosis Date  . Diabetes mellitus     Type II  . Hypertension   . Gout   . Allergy     allergic rhinitis  . CAD (coronary artery disease)     pulmonary hypertension// Cardiac cath 2001//2004 carotid dopplers Normal  . CHF (congestive heart failure) ([place])     non ish. cardiomyopathy// Dr. Harrington Challenger cardiologist  . Cancer [facility]) 2002    Renal cell cancer partial nephrectomy  . GERD (gastroesophageal reflux disease) 2003    EGD polyp; gastritis HH//Dr. Carlean Purl GI  . Diverticula of intestine 2003    colonoscopy polyp; hem; divertic  . Cataract 2008    Right eye  . Personal history of kidney stones   . Hyperlipidemia     TRIG  . Osteopenia 2002    Dexa  . IBS (irritable bowel syndrome)   . Choledocholithiasis   . Transfusion history 1982    PUD/Bleed/Transfusion  . Osteoarthritis 07/12/2007/ 10/2004    spine and knees// LS Degen. changes   . Chronic kidney disease 05/2006    Renal failure ; hospitalized    Past Surgical History  Procedure Laterality Date  . Eye surgery  2008    Right eye cataract  . Cystoscopy w/ stone manipulation  2010- 2011    ERCPS and clearing of choledocholithiasis  . Ercp  2010  . Colonoscopy  09/2006    polyps  . Abdominal hysterectomy  1999    hysterectomy with bladder tack     Social History:  The patient  reports that she has never smoked. She does not have any smokeless tobacco history on file. She reports that she does not drink alcohol or use illicit drugs.   Family History:  The patient's family history includes Cancer in her daughter; Cancer (age of onset: 63) in her son; Heart disease in her daughter.    ROS:  Please see the history of present illness. All other systems are reviewed and  Negative to the above problem except as noted.    PHYSICAL EXAM: VS:  BP 122/58 mmHg  Pulse 60  Ht 5\' 2"  (1.575 m)  Wt 52.164 kg (115 lb)  BMI  21.03 kg/m2  SpO2 98%  GEN: Thin 81 yo in no acute distress HEENT: normal Neck: no JVD, carotid bruits, or masses Cardiac: RRR; no murmurs, rubs, or gallops,no edema  Respiratory:  clear to auscultation bilaterally, normal work of breathing GI: soft, nontender, nondistended, + BS  No hepatomegaly  MS: no deformity Moving all extremities   Skin: warm and dry, no rash Neuro:  Strength and sensation are intact  EKG:  EKG is not ordered today.   Lipid Panel    Component Value Date/Time   CHOL 143 03/12/2015 0833   TRIG 91.0 03/12/2015 0833   HDL 42.00 03/12/2015 0833   CHOLHDL 3 03/12/2015 0833   VLDL 18.2 03/12/2015 0833   LDLCALC 83 03/12/2015 0833   LDLDIRECT 79.3 08/30/2013 1229      Wt Readings from Last 3 Encounters:  08/13/15 52.164 kg (115 lb)  07/17/15 51.71 kg (114 lb)  03/16/15 53.071 kg (117 lb)      ASSESSMENT AND PLAN:  1  CAD  Doing well  No CP  Breathing is stable  2.  HTN  BP is good  No changes  3  HL  Good control  Not on meds    Check BMET and CBC  F/U in July 2017   Signed, Dorris Carnes, MD  08/13/2015 9:38 AM    [facility] [address], [place], [place]  [address] Phone: [phone]; Fax: [phone]

## 2015-08-15 ENCOUNTER — Other Ambulatory Visit: Payer: Self-pay | Admitting: *Deleted

## 2015-08-15 DIAGNOSIS — I10 Essential (primary) hypertension: Secondary | ICD-10-CM

## 2015-10-10 ENCOUNTER — Other Ambulatory Visit: Payer: Self-pay | Admitting: *Deleted

## 2015-10-10 ENCOUNTER — Other Ambulatory Visit: Payer: Self-pay | Admitting: Internal Medicine

## 2015-10-10 MED ORDER — DICYCLOMINE HCL 10 MG PO CAPS: ORAL_CAPSULE | ORAL | Status: DC

## 2015-10-10 NOTE — Telephone Encounter (Signed)
[name] request status of Bentyl refill; advised refill done today and [name] will ck with pharmacy.

## 2015-10-17 ENCOUNTER — Ambulatory Visit (INDEPENDENT_AMBULATORY_CARE_PROVIDER_SITE_OTHER): Payer: Medicare Other | Admitting: *Deleted

## 2015-10-17 DIAGNOSIS — I442 Atrioventricular block, complete: Secondary | ICD-10-CM

## 2015-10-17 DIAGNOSIS — I429 Cardiomyopathy, unspecified: Secondary | ICD-10-CM | POA: Diagnosis not present

## 2015-10-17 DIAGNOSIS — I428 Other cardiomyopathies: Secondary | ICD-10-CM

## 2015-10-17 NOTE — Progress Notes (Signed)
Remote pacemaker transmission.   

## 2015-10-23 DIAGNOSIS — E119 Type 2 diabetes mellitus without complications: Secondary | ICD-10-CM | POA: Diagnosis not present

## 2015-10-30 ENCOUNTER — Encounter: Payer: Self-pay | Admitting: Sports Medicine

## 2015-10-30 ENCOUNTER — Ambulatory Visit (INDEPENDENT_AMBULATORY_CARE_PROVIDER_SITE_OTHER): Payer: Medicare Other | Admitting: Sports Medicine

## 2015-10-30 DIAGNOSIS — M79676 Pain in unspecified toe(s): Secondary | ICD-10-CM | POA: Diagnosis not present

## 2015-10-30 DIAGNOSIS — B351 Tinea unguium: Secondary | ICD-10-CM

## 2015-10-30 DIAGNOSIS — E1140 Type 2 diabetes mellitus with diabetic neuropathy, unspecified: Secondary | ICD-10-CM

## 2015-10-30 NOTE — Progress Notes (Signed)
Patient ID: [name], female   DOB: 1924-01-03, 82 y.o.   MRN: [id number] Subjective: [name] is a 82 y.o. female patient with history of type 2 diabetes who presents to office today complaining of long, painful nails  while  ambulating in shoes; unable to trim. Patient states that the glucose reading this morning was not recorded; A1C 5.8. Patient denies any new changes in medication or new problems. Patient denies any new cramping, numbness, burning or tingling in the legs.  Patient Active Problem List   Diagnosis Date Noted  . Encounter for Medicare annual wellness exam 03/16/2015  . Routine general medical examination at a health care facility 03/16/2015  . Skin lesion of left arm 03/16/2015  . Dyspnea on exertion 06/17/2013  . Complete heart block ([place]) 07/15/2012  . [employer] 07/07/2012  . Vitamin D deficiency disease 08/27/2011  . Spell of transient neurologic symptoms 06/20/2011  . External otitis 05/13/2011  . GERD (gastroesophageal reflux disease) 02/25/2011  . CARDIOMYOPATHY, SECONDARY 01/29/2009  . AV BLOCK, COMPLETE 01/29/2009  . IRRITABLE BOWEL SYNDROME 01/29/2009  . ANEURYSM, SPLENIC ARTERY 01/25/2009  . COLONIC POLYPS, ADENOMATOUS, HX OF 01/25/2009  . DIABETES MELLITUS, TYPE II 12/28/2006  . Hyperlipidemia 12/28/2006  . GOUT 12/28/2006  . Essential hypertension 12/28/2006  . CORONARY ARTERY DISEASE 12/28/2006  . ALLERGIC RHINITIS 12/28/2006  . OSTEOARTHRITIS 12/28/2006  . Osteoporosis 12/28/2006   Current Outpatient Prescriptions on File Prior to Visit  Medication Sig Dispense Refill  . acetaminophen (TYLENOL) 325 MG tablet Take 650 mg by mouth every 6 (six) hours as needed for headache.     Marland Kitchen amLODipine (NORVASC) 5 MG tablet Take 1 tablet (5 mg total) by mouth 2 (two) times daily. 60 tablet 11  . carvedilol (COREG) 25 MG tablet take 1 tablet by mouth twice a day with food 60 tablet 9  . Cholecalciferol (VITAMIN D3) 2000 UNITS TABS Take 1 tablet  by mouth daily.    Marland Kitchen dicyclomine (BENTYL) 10 MG capsule take 1 capsule by mouth three times a day 90 capsule 3  . dorzolamide-timolol (COSOPT) 22.3-6.8 MG/ML ophthalmic solution Place 1 drop into the left eye 2 (two) times daily. As directed.    Marland Kitchen esomeprazole (NEXIUM) 40 MG capsule Take 1 capsule (40 mg total) by mouth daily. 30 capsule 11  . gabapentin (NEURONTIN) 300 MG capsule Take 2 capsules (600 mg total) by mouth 2 (two) times daily. 120 capsule 11  . latanoprost (XALATAN) 0.005 % ophthalmic solution Place 1 drop into the left eye at bedtime.     Marland Kitchen loperamide (LOPERAMIDE A-D) 2 MG tablet Take 2 mg by mouth 3 (three) times daily as needed for diarrhea or loose stools. As needed    . ramipril (ALTACE) 10 MG capsule Take 1 capsule (10 mg total) by mouth daily. 30 capsule 11  . simvastatin (ZOCOR) 20 MG tablet Take 1 tablet (20 mg total) by mouth daily. 30 tablet 11   No current facility-administered medications on file prior to visit.   Allergies  Allergen Reactions  . Aspirin     REACTION: stomach upset  . Atorvastatin     REACTION: muscle pain  . Celecoxib     REACTION: stomach upset  . Fenofibrate     REACTION: muscle pain  . Metformin     REACTION: diarrhea  . Moxifloxacin     REACTION: stomach upset  . Niacin     REACTION: muscle pain    Objective: General: Patient is awake,  alert, and oriented x 3 and in no acute distress.  Integument: Skin is warm, dry and supple bilateral. Nails are tender, long, thickened and  dystrophic with subungual debris, consistent with onychomycosis, 1-5 bilateral. No signs of infection. No open lesions or preulcerative lesions present bilateral. Remaining integument unremarkable.  Vasculature:  Dorsalis Pedis pulse 1/4 bilateral. Posterior Tibial pulse  1/4 bilateral.  Capillary fill time <3 sec 1-5 bilateral. Scant hair growth to the level of the digits. Temperature gradient within normal limits. Mild varicosities present bilateral. No  edema present bilateral.   Neurology: The patient has intact sensation measured with a 5.07/10g Semmes Weinstein Monofilament at all pedal sites bilateral . Vibratory sensation diminished bilateral with tuning fork. No Babinski sign present bilateral.   Musculoskeletal: Mild asymptomatic hammertoe pedal deformities noted bilateral. Muscular strength 5/5 in all lower extremity muscular groups bilateral without pain on range of motion . No tenderness with calf compression bilateral.  Assessment and Plan: Problem List Items Addressed This Visit    None    Visit Diagnoses    Dermatophytosis of nail    -  Primary    Pain of toe, unspecified laterality        Type 2 diabetes, controlled, with neuropathy ([place])          -Examined patient. -Discussed and educated patient on diabetic foot care, especially with  regards to the vascular, neurological and musculoskeletal systems.  -Stressed the importance of good glycemic control and the detriment of not  controlling glucose levels in relation to the foot. -Mechanically debrided all nails 1-5 bilateral using sterile nail nipper and filed with dremel without incident  -Answered all patient questions -Patient to return in 3 months for at risk foot care -Patient advised to call the office if any problems or questions arise in the meantime.  Landis Martins, DPM

## 2015-11-02 ENCOUNTER — Ambulatory Visit: Payer: Self-pay | Admitting: Internal Medicine

## 2015-11-02 ENCOUNTER — Other Ambulatory Visit (INDEPENDENT_AMBULATORY_CARE_PROVIDER_SITE_OTHER): Payer: Medicare Other | Admitting: *Deleted

## 2015-11-02 DIAGNOSIS — I10 Essential (primary) hypertension: Secondary | ICD-10-CM | POA: Diagnosis not present

## 2015-11-02 LAB — CBC
HEMATOCRIT: 35.6 % — AB (ref 36.0–46.0)
HEMOGLOBIN: 11.5 g/dL — AB (ref 12.0–15.0)
MCH: 29.1 pg (ref 26.0–34.0)
MCHC: 32.3 g/dL (ref 30.0–36.0)
MCV: 90.1 fL (ref 78.0–100.0)
MPV: 10.7 fL (ref 8.6–12.4)
Platelets: 139 10*3/uL — ABNORMAL LOW (ref 150–400)
RBC: 3.95 MIL/uL (ref 3.87–5.11)
RDW: 14.4 % (ref 11.5–15.5)
WBC: 4.4 10*3/uL (ref 4.0–10.5)

## 2015-11-17 LAB — CUP PACEART REMOTE DEVICE CHECK
Brady Statistic RA Percent Paced: 91 %
Brady Statistic RV Percent Paced: 100 %
Date Time Interrogation Session: 20170201101700
Implantable Lead Implant Date: 20030715
Implantable Lead Implant Date: 20050112
Implantable Lead Location: 753858
Implantable Lead Location: 753859
Implantable Lead Model: 5076
Lead Channel Impedance Value: 423 Ohm
Lead Channel Impedance Value: 610 Ohm
Lead Channel Pacing Threshold Amplitude: 0.6 V
Lead Channel Pacing Threshold Amplitude: 3 V
Lead Channel Pacing Threshold Pulse Width: 0.4 ms
Lead Channel Pacing Threshold Pulse Width: 1 ms
Lead Channel Setting Pacing Amplitude: 2 V
Lead Channel Setting Sensing Sensitivity: 2.5 mV
Lead Channel Setting Sensing Sensitivity: 2.5 mV
MDC IDC LEAD IMPLANT DT: 20030715
MDC IDC LEAD LOCATION: 753860
MDC IDC LEAD MODEL: 158
MDC IDC LEAD MODEL: 4518
MDC IDC LEAD SERIAL: 115184
MDC IDC LEAD SERIAL: 308576
MDC IDC MSMT BATTERY REMAINING LONGEVITY: 24 mo
MDC IDC MSMT BATTERY REMAINING PERCENTAGE: 38 %
MDC IDC MSMT LEADCHNL RV IMPEDANCE VALUE: 590 Ohm
MDC IDC PG SERIAL: 100410
MDC IDC SET LEADCHNL LV PACING AMPLITUDE: 4.5 V
MDC IDC SET LEADCHNL LV PACING PULSEWIDTH: 1 ms
MDC IDC SET LEADCHNL RV PACING AMPLITUDE: 2.4 V
MDC IDC SET LEADCHNL RV PACING PULSEWIDTH: 0.4 ms

## 2015-11-17 NOTE — Progress Notes (Signed)
Normal remote reviewed.  Next [place] 01/16/16

## 2015-11-28 ENCOUNTER — Encounter: Payer: Self-pay | Admitting: Cardiology

## 2015-12-21 ENCOUNTER — Emergency Department: Payer: Medicare Other

## 2015-12-21 ENCOUNTER — Emergency Department
Admission: EM | Admit: 2015-12-21 | Discharge: 2015-12-21 | Disposition: A | Discharge status: Home / Self Care | Payer: Medicare Other | Attending: Emergency Medicine | Admitting: Emergency Medicine

## 2015-12-21 ENCOUNTER — Encounter: Payer: Self-pay | Admitting: *Deleted

## 2015-12-21 DIAGNOSIS — E785 Hyperlipidemia, unspecified: Secondary | ICD-10-CM | POA: Diagnosis not present

## 2015-12-21 DIAGNOSIS — I2510 Atherosclerotic heart disease of native coronary artery without angina pectoris: Secondary | ICD-10-CM | POA: Diagnosis not present

## 2015-12-21 DIAGNOSIS — W19XXXA Unspecified fall, initial encounter: Secondary | ICD-10-CM

## 2015-12-21 DIAGNOSIS — S60947A Unspecified superficial injury of left little finger, initial encounter: Secondary | ICD-10-CM | POA: Diagnosis present

## 2015-12-21 DIAGNOSIS — S62609A Fracture of unspecified phalanx of unspecified finger, initial encounter for closed fracture: Secondary | ICD-10-CM

## 2015-12-21 DIAGNOSIS — Y9389 Activity, other specified: Secondary | ICD-10-CM | POA: Insufficient documentation

## 2015-12-21 DIAGNOSIS — I429 Cardiomyopathy, unspecified: Secondary | ICD-10-CM | POA: Diagnosis not present

## 2015-12-21 DIAGNOSIS — Y92002 Bathroom of unspecified non-institutional (private) residence single-family (private) house as the place of occurrence of the external cause: Secondary | ICD-10-CM | POA: Diagnosis not present

## 2015-12-21 DIAGNOSIS — E119 Type 2 diabetes mellitus without complications: Secondary | ICD-10-CM | POA: Diagnosis not present

## 2015-12-21 DIAGNOSIS — W010XXA Fall on same level from slipping, tripping and stumbling without subsequent striking against object, initial encounter: Secondary | ICD-10-CM | POA: Insufficient documentation

## 2015-12-21 DIAGNOSIS — M810 Age-related osteoporosis without current pathological fracture: Secondary | ICD-10-CM | POA: Diagnosis not present

## 2015-12-21 DIAGNOSIS — N189 Chronic kidney disease, unspecified: Secondary | ICD-10-CM | POA: Diagnosis not present

## 2015-12-21 DIAGNOSIS — K219 Gastro-esophageal reflux disease without esophagitis: Secondary | ICD-10-CM | POA: Insufficient documentation

## 2015-12-21 DIAGNOSIS — Z8553 Personal history of malignant neoplasm of renal pelvis: Secondary | ICD-10-CM | POA: Insufficient documentation

## 2015-12-21 DIAGNOSIS — Y999 Unspecified external cause status: Secondary | ICD-10-CM | POA: Insufficient documentation

## 2015-12-21 DIAGNOSIS — I10 Essential (primary) hypertension: Secondary | ICD-10-CM | POA: Diagnosis not present

## 2015-12-21 DIAGNOSIS — I130 Hypertensive heart and chronic kidney disease with heart failure and stage 1 through stage 4 chronic kidney disease, or unspecified chronic kidney disease: Secondary | ICD-10-CM | POA: Diagnosis not present

## 2015-12-21 DIAGNOSIS — I509 Heart failure, unspecified: Secondary | ICD-10-CM | POA: Insufficient documentation

## 2015-12-21 DIAGNOSIS — Z79899 Other long term (current) drug therapy: Secondary | ICD-10-CM | POA: Insufficient documentation

## 2015-12-21 DIAGNOSIS — S62627A Displaced fracture of medial phalanx of left little finger, initial encounter for closed fracture: Secondary | ICD-10-CM | POA: Insufficient documentation

## 2015-12-21 DIAGNOSIS — S61412A Laceration without foreign body of left hand, initial encounter: Secondary | ICD-10-CM | POA: Diagnosis not present

## 2015-12-21 LAB — BASIC METABOLIC PANEL
ANION GAP: 1 — AB (ref 5–15)
BUN: 21 mg/dL — ABNORMAL HIGH (ref 6–20)
CHLORIDE: 109 mmol/L (ref 101–111)
CO2: 29 mmol/L (ref 22–32)
Calcium: 8.9 mg/dL (ref 8.9–10.3)
Creatinine, Ser: 0.8 mg/dL (ref 0.44–1.00)
GFR calc non Af Amer: >60 mL/min (ref >60)
Glucose, Bld: 123 mg/dL — ABNORMAL HIGH (ref 65–99)
POTASSIUM: 4.7 mmol/L (ref 3.5–5.1)
SODIUM: 139 mmol/L (ref 135–145)

## 2015-12-21 LAB — CBC
HCT: 33.5 % — ABNORMAL LOW (ref 35.0–47.0)
Hemoglobin: 11 g/dL — ABNORMAL LOW (ref 12.0–16.0)
MCH: 29.2 pg (ref 26.0–34.0)
MCHC: 33 g/dL (ref 32.0–36.0)
MCV: 88.5 fL (ref 80.0–100.0)
Platelets: 122 K/uL — ABNORMAL LOW (ref 150–440)
RBC: 3.78 MIL/uL — ABNORMAL LOW (ref 3.80–5.20)
RDW: 14.1 % (ref 11.5–14.5)
WBC: 4.7 K/uL (ref 3.6–11.0)

## 2015-12-21 LAB — TROPONIN I: Troponin I: <0.03 ng/mL

## 2015-12-21 LAB — GLUCOSE, CAPILLARY: Glucose-Capillary: 110 mg/dL — ABNORMAL HIGH (ref 65–99)

## 2015-12-21 MED ORDER — ACETAMINOPHEN 500 MG PO TABS
ORAL_TABLET | ORAL | Status: AC
  Filled 2015-12-21: qty 2

## 2015-12-21 MED ORDER — CEPHALEXIN 500 MG PO CAPS: 500.0000 mg | ORAL_CAPSULE | Freq: Four times a day (QID) | ORAL | Status: AC

## 2015-12-21 NOTE — Discharge Instructions (Signed)
You have been seen in the Emergency Department (ED) today for a fall.  Your work up does not show any concerning injuries.    Please follow up with your doctor regarding today's Emergency Department (ED) visit and your recent fall.    Return to the ED if you have any headache, confusion, slurred speech, weakness/numbness of any arm or leg, or any increased pain.   Please use Vaseline gauze to wrap the injured finger twice daily, and monitor for signs of infection such as pus draining, fever, redness, swelling, or increasing pain. If any concerning symptoms or signs of infection arise please come back to the ER or call your doctor.

## 2015-12-21 NOTE — ED Notes (Addendum)
Family states pt was using her walker to get to bathroom this AM and pt was found on the floor this AM, arrives with laceration left pink finger, syncopal episode unwitnessed, awake and alert, hx of alzheimers, pt not on any blood thinners

## 2015-12-21 NOTE — ED Provider Notes (Signed)
[facility] Emergency Department Provider Note  ____________________________________________  Time seen: Approximately 12:23 PM  I have reviewed the triage vital signs and the nursing notes.   HISTORY  Chief Complaint Loss of Consciousness and Extremity Laceration    HPI [name] is a 82 y.o. female resents for evaluation after fall. Patient's daughter lives with her notes that she had fallen walking to the bathroom last night. They do not know that she had lost consciousness, she was fully conscious for them and has dementia. He reports she does fall on occasion. She has a cut over her left little finger, also a little bruising over her left elbow. She has not been complaining of a headache and she has been acting normally. No neck pain.  They deny any recent illness. She is not a fevers or chills. She's been in her normal state of health. Family suspects that the patient likely is fell, as she gets "tripped up" from time to time.  No trouble speaking, no weakness in any one arm or leg.   Past Medical History  Diagnosis Date  . Diabetes mellitus     Type II  . Hypertension   . Gout   . Allergy     allergic rhinitis  . CAD (coronary artery disease)     pulmonary hypertension// Cardiac cath 2001//2004 carotid dopplers Normal  . CHF (congestive heart failure) ([place])     non ish. cardiomyopathy// Dr. Harrington Challenger cardiologist  . Cancer [facility]) 2002    Renal cell cancer partial nephrectomy  . GERD (gastroesophageal reflux disease) 2003    EGD polyp; gastritis HH//Dr. Carlean Purl GI  . Diverticula of intestine 2003    colonoscopy polyp; hem; divertic  . Cataract 2008    Right eye  . Personal history of kidney stones   . Hyperlipidemia     TRIG  . Osteopenia 2002    Dexa  . IBS (irritable bowel syndrome)   . Choledocholithiasis   . Transfusion history 1982    PUD/Bleed/Transfusion  . Osteoarthritis 07/12/2007/ 10/2004    spine and knees// LS Degen. changes    . Chronic kidney disease 05/2006    Renal failure ; hospitalized    Patient Active Problem List   Diagnosis Date Noted  . Encounter for Medicare annual wellness exam 03/16/2015  . Routine general medical examination at a health care facility 03/16/2015  . Skin lesion of left arm 03/16/2015  . Dyspnea on exertion 06/17/2013  . Complete heart block ([place]) 07/15/2012  . [employer] 07/07/2012  . Vitamin D deficiency disease 08/27/2011  . Spell of transient neurologic symptoms 06/20/2011  . External otitis 05/13/2011  . GERD (gastroesophageal reflux disease) 02/25/2011  . CARDIOMYOPATHY, SECONDARY 01/29/2009  . AV BLOCK, COMPLETE 01/29/2009  . IRRITABLE BOWEL SYNDROME 01/29/2009  . ANEURYSM, SPLENIC ARTERY 01/25/2009  . COLONIC POLYPS, ADENOMATOUS, HX OF 01/25/2009  . DIABETES MELLITUS, TYPE II 12/28/2006  . Hyperlipidemia 12/28/2006  . GOUT 12/28/2006  . Essential hypertension 12/28/2006  . CORONARY ARTERY DISEASE 12/28/2006  . ALLERGIC RHINITIS 12/28/2006  . OSTEOARTHRITIS 12/28/2006  . Osteoporosis 12/28/2006    Past Surgical History  Procedure Laterality Date  . Eye surgery  2008    Right eye cataract  . Cystoscopy w/ stone manipulation  2010- 2011    ERCPS and clearing of choledocholithiasis  . Ercp  2010  . Colonoscopy  09/2006    polyps  . Abdominal hysterectomy  1999    hysterectomy with bladder tack  Current Outpatient Rx  Name  Route  Sig  Dispense  Refill  . acetaminophen (TYLENOL) 325 MG tablet   Oral   Take 650 mg by mouth every 6 (six) hours as needed for headache.          Marland Kitchen amLODipine (NORVASC) 5 MG tablet   Oral   Take 1 tablet (5 mg total) by mouth 2 (two) times daily.   60 tablet   11   . carvedilol (COREG) 25 MG tablet      take 1 tablet by mouth twice a day with food   60 tablet   9   . cephALEXin (KEFLEX) 500 MG capsule   Oral   Take 1 capsule (500 mg total) by mouth 4 (four) times daily.   40 capsule   0   .  Cholecalciferol (VITAMIN D3) 2000 UNITS TABS   Oral   Take 1 tablet by mouth daily.         Marland Kitchen dicyclomine (BENTYL) 10 MG capsule      take 1 capsule by mouth three times a day   90 capsule   3   . dorzolamide-timolol (COSOPT) 22.3-6.8 MG/ML ophthalmic solution   Left Eye   Place 1 drop into the left eye 2 (two) times daily. As directed.         Marland Kitchen esomeprazole (NEXIUM) 40 MG capsule   Oral   Take 1 capsule (40 mg total) by mouth daily.   30 capsule   11   . gabapentin (NEURONTIN) 300 MG capsule   Oral   Take 2 capsules (600 mg total) by mouth 2 (two) times daily.   120 capsule   11   . latanoprost (XALATAN) 0.005 % ophthalmic solution   Left Eye   Place 1 drop into the left eye at bedtime.          Marland Kitchen loperamide (LOPERAMIDE A-D) 2 MG tablet   Oral   Take 2 mg by mouth 3 (three) times daily as needed for diarrhea or loose stools. As needed         . ramipril (ALTACE) 10 MG capsule   Oral   Take 1 capsule (10 mg total) by mouth daily.   30 capsule   11   . simvastatin (ZOCOR) 20 MG tablet   Oral   Take 1 tablet (20 mg total) by mouth daily.   30 tablet   11    patient not on any anticoagulant  Allergies Aspirin; Atorvastatin; Celecoxib; Fenofibrate; Metformin; Moxifloxacin; and Niacin  Family History  Problem Relation Age of Onset  . Cancer Daughter     breast cancer  . Cancer Son 41    Esophageal Cancer  . Heart disease Daughter     Social History Social History  Substance Use Topics  . Smoking status: Never Smoker   . Smokeless tobacco: None  . Alcohol Use: No    Review of Systems Constitutional: No fever/chills Eyes: No visual changes. ENT: No sore throat. Cardiovascular: Denies chest pain. Respiratory: Denies shortness of breath. Gastrointestinal: No abdominal pain.  No nausea, no vomiting.  No diarrhea.  No constipation. Genitourinary: Negative for dysuria. Musculoskeletal: Negative for back pain. Slight swollen tip of the left  finger with a small laceration noted. She is able to bend the finger okay and uses it. Skin: Negative for rash. Neurological: Negative for headaches, focal weakness or numbness.  Patient states she feels fine and has no complaint.  10-point ROS otherwise negative.  ____________________________________________   PHYSICAL EXAM:  VITAL SIGNS: ED Triage Vitals  Enc Vitals Group     BP 12/21/15 1109 137/59 mmHg     Pulse Rate 12/21/15 1109 60     Resp 12/21/15 1109 18     Temp 12/21/15 1109 98 F (36.7 C)     Temp Source 12/21/15 1109 Oral     SpO2 12/21/15 1109 99 %     Weight 12/21/15 1109 120 lb (54.432 kg)     Height 12/21/15 1109 5\' 2"  (1.575 m)     Head Cir --      Peak Flow --      Pain Score --      Pain Loc --      Pain Edu? --      Excl. in [place]? --    Constitutional: Alert and oriented to daughter, but not to date or place. Well appearing and in no acute distress. Seated comfortably. Eyes: Conjunctivae are normal. PERRL. EOMI. Head: Atraumatic. Nose: No congestion/rhinnorhea. Mouth/Throat: Mucous membranes are moist.  Oropharynx non-erythematous. Neck: No stridor.  No cervical spine tenderness. Cardiovascular: Normal rate, regular rhythm. Grossly normal heart sounds.  Good peripheral circulation. Respiratory: Normal respiratory effort.  No retractions. Lungs CTAB. Gastrointestinal: Soft and nontender. No distention. Musculoskeletal: No lower extremity tenderness nor edema.  No joint effusions.  Right hand Median, ulnar, radial motor intact. Cap refill less than 2 seconds all digits. Strong radial pulse. 5 out of 5 strength throughout the hand intrinsics, flexion and extension at the wrist. No evidence of trauma.  Left hand Median, ulnar, radial motor intact. Cap refill less than 2 seconds all digits. Strong radial pulse. 5 out of 5 strength throughout the hand intrinsics, flexion and extension at the wrist. No evidence of trauma except for an approximately 1-1/2 cm  superficial laceration involving epidermis only, slightly Lee swollen over the volar surface. The patient's skin is noted to be very thin, and the laceration is very superficial. If they're discussing with the patient's daughter and reviewing her wound, we will treat with bandaging with saline and allowing it to heal on its own as given the thinness of the skin we do not believe it wouldn't hold sutures reasonably.Marland Kitchen  RIGHT Right upper extremity demonstrates normal strength, good use of all muscles. No edema bruising or contusions of the right shoulder/upper arm, right elbow, right forearm / hand. Full range of motion of the right right upper extremity without pain. No evidence of trauma. Strong radial pulse. Intact median/ulnar/radial neuro-muscular exam.  LEFT Left upper extremity demonstrates normal strength, good use of all muscles. No edema bruising or contusions of the left shoulder/upper arm, left elbow, left forearm / hand. Full range of motion of the left  upper extremity without pain. No evidence of trauma. Strong radial pulse. Intact median/ulnar/radial neuro-muscular exam.  ____________________________________________  Lower Extremities  No edema. Normal DP/PT pulses bilateral with good cap refill.  Normal neuro-motor function lower extremities bilateral.  RIGHT Right lower extremity demonstrates normal strength, good use of all muscles. No edema bruising or contusions of the right hip, right knee, right ankle. Full range of motion of the right lower extremity without pain. No pain on axial loading. No evidence of trauma.  LEFT Left lower extremity demonstrates normal strength, good use of all muscles. No edema bruising or contusions of the hip,  knee, ankle. Full range of motion of the left lower extremity without pain. No pain on axial loading. No evidence of trauma.  Neurologic:  Normal speech and language. No gross focal neurologic deficits are appreciated. No gait  instability. Skin:  Skin is warm, dry and intact. No rash noted. Psychiatric: Mood and affect are normal. Speech and behavior are normal.  ____________________________________________   LABS (all labs ordered are listed, but only abnormal results are displayed)  Labs Reviewed  BASIC METABOLIC PANEL - Abnormal; Notable for the following:    Glucose, Bld 123 (*)    BUN 21 (*)    Anion gap 1 (*)    All other components within normal limits  CBC - Abnormal; Notable for the following:    RBC 3.78 (*)    Hemoglobin 11.0 (*)    HCT 33.5 (*)    Platelets 122 (*)    All other components within normal limits  GLUCOSE, CAPILLARY - Abnormal; Notable for the following:    Glucose-Capillary 110 (*)    All other components within normal limits  TROPONIN I  CBG MONITORING, ED   ____________________________________________  EKG  Reviewed injury by me at 11:15 AM Ventricular rate 60 QRS 1:30 QTc 490 Reviewed and interpreted as AV sequential pacemaker, no obvious ischemic abnormality noted. ____________________________________________  RADIOLOGY  DG Finger Little Left (Final result) Result time: 12/21/15 13:13:32   Final result by Rad Results In Interface (12/21/15 13:13:32)   Narrative:   CLINICAL DATA: Pain after fall  EXAM: LEFT LITTLE FINGER 2+V  COMPARISON: None.  FINDINGS: There is a comminuted displaced fracture of the fifth middle phalanx. No other acute abnormalities.  IMPRESSION: Comminuted displaced fracture of the fifth middle phalanx   Electronically Signed By: Dorise Bullion III M.D On: 12/21/2015 13:13    ____________________________________________   PROCEDURES  Procedure(s) performed: None  Critical Care performed: No  ____________________________________________   INITIAL IMPRESSION / ASSESSMENT AND PLAN / ED COURSE  Pertinent labs & imaging results that were available during my care of the patient were reviewed by me and  considered in my medical decision making (see chart for details).  Patient presents for fall, and a small cut on the left finger. No evidence of significant or major trauma. In discussing with the patient and her daughter, appears that she likely did not have actual syncope but rather probable mechanical type fall. She has a history of falls, and she was never witnessed to be passed out, she was alert and normal state of health when she was found.  No cardiopulmonary or neurologic complaint or abnormality. Patient is appropriate, vital signs stable here. She is not complaining of anything of a small cut over the hand which we will allow to heal and bandage appropriately.  ----------------------------------------- 2:04 PM on 12/21/2015 -----------------------------------------  The patient awake alert, no distress. Careful and treatment recommendations for laceration as well as buddy taping of the fingers fourth and fifth on the left discussed with family. We discussed other ways of managing her fracture including splinting, however given her history of dementia and difficulties with falls family feels that we should take a very conservative approach and I think is very reasonable. No monitor closely for signs of infection, we have discussed use of antibiotics, and they'll follow-up will see with her doctor.  Return precautions and treatment recommendations and follow-up discussed with the patient who is agreeable with the plan.  ____________________________________________   FINAL CLINICAL IMPRESSION(S) / ED DIAGNOSES  Final diagnoses:  Fracture phalanges, hand, closed, initial encounter  Fall, initial encounter  Laceration of hand, left, initial encounter      [employer]  Jacqualine Code, MD 12/21/15 1406

## 2015-12-25 ENCOUNTER — Observation Stay (HOSPITAL_COMMUNITY): Payer: Medicare Other

## 2015-12-25 ENCOUNTER — Other Ambulatory Visit: Payer: Self-pay

## 2015-12-25 ENCOUNTER — Encounter (HOSPITAL_COMMUNITY): Payer: Self-pay | Admitting: Emergency Medicine

## 2015-12-25 ENCOUNTER — Emergency Department (HOSPITAL_COMMUNITY): Payer: Medicare Other

## 2015-12-25 ENCOUNTER — Observation Stay (HOSPITAL_COMMUNITY)
Admission: EM | Admit: 2015-12-25 | Discharge: 2015-12-27 | Disposition: A | Discharge status: Home / Self Care | Payer: Medicare Other | Attending: Internal Medicine | Admitting: Internal Medicine

## 2015-12-25 DIAGNOSIS — K219 Gastro-esophageal reflux disease without esophagitis: Secondary | ICD-10-CM | POA: Diagnosis not present

## 2015-12-25 DIAGNOSIS — R404 Transient alteration of awareness: Secondary | ICD-10-CM

## 2015-12-25 DIAGNOSIS — F0281 Dementia in other diseases classified elsewhere with behavioral disturbance: Secondary | ICD-10-CM

## 2015-12-25 DIAGNOSIS — N189 Chronic kidney disease, unspecified: Secondary | ICD-10-CM | POA: Diagnosis not present

## 2015-12-25 DIAGNOSIS — G3183 Dementia with Lewy bodies: Secondary | ICD-10-CM | POA: Diagnosis present

## 2015-12-25 DIAGNOSIS — R55 Syncope and collapse: Secondary | ICD-10-CM | POA: Diagnosis not present

## 2015-12-25 DIAGNOSIS — I129 Hypertensive chronic kidney disease with stage 1 through stage 4 chronic kidney disease, or unspecified chronic kidney disease: Secondary | ICD-10-CM | POA: Diagnosis not present

## 2015-12-25 DIAGNOSIS — N390 Urinary tract infection, site not specified: Secondary | ICD-10-CM | POA: Diagnosis present

## 2015-12-25 DIAGNOSIS — M109 Gout, unspecified: Secondary | ICD-10-CM | POA: Insufficient documentation

## 2015-12-25 DIAGNOSIS — Z792 Long term (current) use of antibiotics: Secondary | ICD-10-CM | POA: Diagnosis not present

## 2015-12-25 DIAGNOSIS — I509 Heart failure, unspecified: Secondary | ICD-10-CM | POA: Diagnosis not present

## 2015-12-25 DIAGNOSIS — K589 Irritable bowel syndrome without diarrhea: Secondary | ICD-10-CM | POA: Diagnosis not present

## 2015-12-25 DIAGNOSIS — R531 Weakness: Secondary | ICD-10-CM | POA: Diagnosis not present

## 2015-12-25 DIAGNOSIS — I2510 Atherosclerotic heart disease of native coronary artery without angina pectoris: Secondary | ICD-10-CM | POA: Diagnosis not present

## 2015-12-25 DIAGNOSIS — Z85528 Personal history of other malignant neoplasm of kidney: Secondary | ICD-10-CM | POA: Insufficient documentation

## 2015-12-25 DIAGNOSIS — Z79899 Other long term (current) drug therapy: Secondary | ICD-10-CM | POA: Diagnosis not present

## 2015-12-25 DIAGNOSIS — I10 Essential (primary) hypertension: Secondary | ICD-10-CM | POA: Diagnosis present

## 2015-12-25 DIAGNOSIS — M8580 Other specified disorders of bone density and structure, unspecified site: Secondary | ICD-10-CM | POA: Insufficient documentation

## 2015-12-25 DIAGNOSIS — N179 Acute kidney failure, unspecified: Secondary | ICD-10-CM

## 2015-12-25 DIAGNOSIS — H269 Unspecified cataract: Secondary | ICD-10-CM | POA: Diagnosis not present

## 2015-12-25 DIAGNOSIS — R51 Headache: Secondary | ICD-10-CM | POA: Diagnosis not present

## 2015-12-25 DIAGNOSIS — K8050 Calculus of bile duct without cholangitis or cholecystitis without obstruction: Secondary | ICD-10-CM | POA: Insufficient documentation

## 2015-12-25 DIAGNOSIS — Z87442 Personal history of urinary calculi: Secondary | ICD-10-CM | POA: Diagnosis not present

## 2015-12-25 DIAGNOSIS — E119 Type 2 diabetes mellitus without complications: Secondary | ICD-10-CM | POA: Diagnosis not present

## 2015-12-25 DIAGNOSIS — R4189 Other symptoms and signs involving cognitive functions and awareness: Secondary | ICD-10-CM | POA: Diagnosis present

## 2015-12-25 DIAGNOSIS — E785 Hyperlipidemia, unspecified: Secondary | ICD-10-CM | POA: Insufficient documentation

## 2015-12-25 HISTORY — DX: Type 2 diabetes mellitus without complications: E11.9

## 2015-12-25 HISTORY — DX: Personal history of other medical treatment: Z92.89

## 2015-12-25 HISTORY — DX: Personal history of urinary (tract) infections: Z87.440

## 2015-12-25 HISTORY — DX: Calculus of kidney: N20.0

## 2015-12-25 HISTORY — DX: Headache: R51

## 2015-12-25 HISTORY — DX: Acute myocardial infarction, unspecified: I21.9

## 2015-12-25 HISTORY — DX: Malignant neoplasm of unspecified kidney, except renal pelvis: C64.9

## 2015-12-25 HISTORY — DX: Personal history of other diseases of the musculoskeletal system and connective tissue: Z87.39

## 2015-12-25 HISTORY — DX: Presence of cardiac pacemaker: Z95.0

## 2015-12-25 HISTORY — DX: Personal history of peptic ulcer disease: Z87.11

## 2015-12-25 HISTORY — DX: Headache, unspecified: R51.9

## 2015-12-25 HISTORY — DX: Pneumonia, unspecified organism: J18.9

## 2015-12-25 HISTORY — DX: Unspecified osteoarthritis, unspecified site: M19.90

## 2015-12-25 HISTORY — DX: Unspecified dementia, unspecified severity, without behavioral disturbance, psychotic disturbance, mood disturbance, and anxiety: F03.90

## 2015-12-25 LAB — COMPREHENSIVE METABOLIC PANEL
ALBUMIN: 3.5 g/dL (ref 3.5–5.0)
ALK PHOS: 61 U/L (ref 38–126)
ALT: 11 U/L — ABNORMAL LOW (ref 14–54)
AST: 19 U/L (ref 15–41)
Anion gap: 11 (ref 5–15)
BUN: 20 mg/dL (ref 6–20)
CHLORIDE: 103 mmol/L (ref 101–111)
CO2: 27 mmol/L (ref 22–32)
CREATININE: 1.04 mg/dL — AB (ref 0.44–1.00)
Calcium: 9.1 mg/dL (ref 8.9–10.3)
GFR calc Af Amer: 54 mL/min — ABNORMAL LOW (ref >60)
GFR calc non Af Amer: 46 mL/min — ABNORMAL LOW (ref >60)
Glucose, Bld: 181 mg/dL — ABNORMAL HIGH (ref 65–99)
Potassium: 4.4 mmol/L (ref 3.5–5.1)
SODIUM: 141 mmol/L (ref 135–145)
TOTAL PROTEIN: 6 g/dL — AB (ref 6.5–8.1)
Total Bilirubin: 0.6 mg/dL (ref 0.3–1.2)

## 2015-12-25 LAB — GLUCOSE, CAPILLARY
GLUCOSE-CAPILLARY: 169 mg/dL — AB (ref 65–99)
GLUCOSE-CAPILLARY: 109 mg/dL — AB (ref 65–99)
GLUCOSE-CAPILLARY: 126 mg/dL — AB (ref 65–99)

## 2015-12-25 LAB — VITAMIN B12: VITAMIN B 12: 106 pg/mL — AB (ref 180–914)

## 2015-12-25 LAB — CBC
HCT: 36.5 % (ref 36.0–46.0)
HEMOGLOBIN: 11.4 g/dL — AB (ref 12.0–15.0)
MCH: 28.4 pg (ref 26.0–34.0)
MCHC: 31.2 g/dL (ref 30.0–36.0)
MCV: 90.8 fL (ref 78.0–100.0)
PLATELETS: 132 10*3/uL — AB (ref 150–400)
RBC: 4.02 MIL/uL (ref 3.87–5.11)
RDW: 13.4 % (ref 11.5–15.5)
WBC: 5.9 10*3/uL (ref 4.0–10.5)

## 2015-12-25 LAB — I-STAT TROPONIN, ED: Troponin i, poc: 0 ng/mL (ref 0.00–0.08)

## 2015-12-25 LAB — TSH: TSH: 0.627 u[IU]/mL (ref 0.350–4.500)

## 2015-12-25 LAB — TROPONIN I: Troponin I: <0.03 ng/mL (ref <0.031)

## 2015-12-25 MED ORDER — DORZOLAMIDE HCL-TIMOLOL MAL 2-0.5 % OP SOLN
1.0000 [drp] | Freq: Two times a day (BID) | OPHTHALMIC | Status: DC
  Administered 2015-12-25 – 2015-12-27 (×3): 1 [drp] via OPHTHALMIC
  Filled 2015-12-25: qty 10

## 2015-12-25 MED ORDER — DICYCLOMINE HCL 10 MG PO CAPS
10.0000 mg | ORAL_CAPSULE | Freq: Three times a day (TID) | ORAL | Status: DC
  Administered 2015-12-25 – 2015-12-27 (×7): 10 mg via ORAL
  Filled 2015-12-25 (×7): qty 1

## 2015-12-25 MED ORDER — SODIUM CHLORIDE 0.9% FLUSH
3.0000 mL | Freq: Two times a day (BID) | INTRAVENOUS | Status: DC
  Administered 2015-12-25 – 2015-12-26 (×2): 3 mL via INTRAVENOUS

## 2015-12-25 MED ORDER — TRAZODONE HCL 50 MG PO TABS
25.0000 mg | ORAL_TABLET | Freq: Every evening | ORAL | Status: DC | PRN
  Administered 2015-12-25 – 2015-12-26 (×2): 25 mg via ORAL
  Filled 2015-12-25 (×2): qty 1

## 2015-12-25 MED ORDER — ONDANSETRON HCL 4 MG PO TABS: 4.0000 mg | ORAL_TABLET | Freq: Four times a day (QID) | ORAL | Status: DC | PRN

## 2015-12-25 MED ORDER — CEPHALEXIN 500 MG PO CAPS
500.0000 mg | ORAL_CAPSULE | Freq: Four times a day (QID) | ORAL | Status: DC
  Administered 2015-12-25 – 2015-12-27 (×8): 500 mg via ORAL
  Filled 2015-12-25 (×8): qty 1

## 2015-12-25 MED ORDER — HYDROCODONE-ACETAMINOPHEN 5-325 MG PO TABS: 1.0000 | ORAL_TABLET | ORAL | Status: DC | PRN

## 2015-12-25 MED ORDER — GABAPENTIN 300 MG PO CAPS: 600.0000 mg | ORAL_CAPSULE | Freq: Two times a day (BID) | ORAL | Status: DC

## 2015-12-25 MED ORDER — BISACODYL 10 MG RE SUPP: 10.0000 mg | Freq: Every day | RECTAL | Status: DC | PRN

## 2015-12-25 MED ORDER — ACETAMINOPHEN 325 MG PO TABS
650.0000 mg | ORAL_TABLET | Freq: Four times a day (QID) | ORAL | Status: DC | PRN
Start: 2015-12-25 — End: 2015-12-27

## 2015-12-25 MED ORDER — LATANOPROST 0.005 % OP SOLN
1.0000 [drp] | Freq: Every day | OPHTHALMIC | Status: DC
  Administered 2015-12-25 – 2015-12-26 (×2): 1 [drp] via OPHTHALMIC
  Filled 2015-12-25: qty 2.5

## 2015-12-25 MED ORDER — SODIUM CHLORIDE 0.9 % IV SOLN
INTRAVENOUS | Status: AC
  Administered 2015-12-25: 1000 mL via INTRAVENOUS

## 2015-12-25 MED ORDER — SIMVASTATIN 20 MG PO TABS
20.0000 mg | ORAL_TABLET | Freq: Every evening | ORAL | Status: DC
  Administered 2015-12-25 – 2015-12-26 (×2): 20 mg via ORAL
  Filled 2015-12-25 (×2): qty 1

## 2015-12-25 MED ORDER — INSULIN ASPART 100 UNIT/ML ~~LOC~~ SOLN
0.0000 [IU] | Freq: Three times a day (TID) | SUBCUTANEOUS | Status: DC
  Administered 2015-12-27: 1 [IU] via SUBCUTANEOUS

## 2015-12-25 MED ORDER — ENOXAPARIN SODIUM 40 MG/0.4ML ~~LOC~~ SOLN: 40.0000 mg | SUBCUTANEOUS | Status: DC

## 2015-12-25 MED ORDER — ACETAMINOPHEN 650 MG RE SUPP
650.0000 mg | Freq: Four times a day (QID) | RECTAL | Status: DC | PRN
Start: 2015-12-25 — End: 2015-12-27

## 2015-12-25 MED ORDER — AMLODIPINE BESYLATE 5 MG PO TABS
5.0000 mg | ORAL_TABLET | Freq: Two times a day (BID) | ORAL | Status: DC
  Administered 2015-12-25 – 2015-12-27 (×5): 5 mg via ORAL
  Filled 2015-12-25 (×5): qty 1

## 2015-12-25 MED ORDER — LORAZEPAM 2 MG/ML IJ SOLN
0.5000 mg | Freq: Every evening | INTRAMUSCULAR | Status: DC | PRN
Start: 2015-12-25 — End: 2015-12-27

## 2015-12-25 MED ORDER — CEPHALEXIN 500 MG PO CAPS: 500.0000 mg | ORAL_CAPSULE | Freq: Four times a day (QID) | ORAL | Status: DC

## 2015-12-25 MED ORDER — SODIUM CHLORIDE 0.9 % IV SOLN: INTRAVENOUS | Status: DC

## 2015-12-25 MED ORDER — POLYETHYLENE GLYCOL 3350 17 G PO PACK: 17.0000 g | PACK | Freq: Every day | ORAL | Status: DC | PRN

## 2015-12-25 MED ORDER — MORPHINE SULFATE (PF) 2 MG/ML IV SOLN: 1.0000 mg | INTRAVENOUS | Status: DC | PRN

## 2015-12-25 MED ORDER — PANTOPRAZOLE SODIUM 40 MG PO TBEC
40.0000 mg | DELAYED_RELEASE_TABLET | Freq: Every day | ORAL | Status: DC
  Administered 2015-12-25 – 2015-12-27 (×3): 40 mg via ORAL
  Filled 2015-12-25 (×3): qty 1

## 2015-12-25 MED ORDER — ENOXAPARIN SODIUM 30 MG/0.3ML ~~LOC~~ SOLN
30.0000 mg | SUBCUTANEOUS | Status: DC
  Administered 2015-12-25 – 2015-12-26 (×2): 30 mg via SUBCUTANEOUS
  Filled 2015-12-25 (×2): qty 0.3

## 2015-12-25 MED ORDER — ONDANSETRON HCL 4 MG/2ML IJ SOLN: 4.0000 mg | Freq: Four times a day (QID) | INTRAMUSCULAR | Status: DC | PRN

## 2015-12-25 NOTE — H&P (Signed)
[facility] History and Physical  [name] [id number] DOB: 12-Feb-1924 DOA: 12/25/2015  Referring physician: Darl Householder PCP: Loura Pardon, MD   Chief Complaint: syncope  HPI: [name] is a very pleasant slightly demented 82 y.o. female with a past medical history that includes diabetes, hypertension, CAD, CHF with pacemaker, GERD, IBS, chronic kidney disease presents to the emergency department with chief complaint of loss of consciousness complaints of headache. Initial evaluation concerning for syncope  Information is obtained from the patient and family who are at the bedside. Patient lives with her daughter who states they were sitting at the table this morning and daughter reports patient suddenly put her hand to her for head complained of a headache and then "lay her head on the table and would not respond". Reports patient's eyes were closed respirations were steady she did not fall the chair. Loss of bladder or bowel control. The episode lasted about a minute. The daughter called EMS he reported the patient's blood pressure was 76/50. She was given normal saline. Patient denies any chest pain palpitation visual changes dizziness. She denies any abdominal pain nausea vomiting diarrhea constipation. The daughter reports that the patient's appetite is only fair and that "she really doesn't drink much water". Daughter also reports patient is incontinent of urine and that urine has been more concentrated of late. No report of any fever chills cough lower extremity edema or orthopnea. Patient did fall several days ago and had laceration on her left hand with fracture. Patient has been taking very small amounts of pain medicine for this injury. Addition she has been on Keflex for 4 days or UTI.  In the emergency department she is afebrile hemodynamically stable and not hypoxic.  Review of Systems:  10 point review of systems complete and all systems are negative except as indicated in the  history of present illness  Past Medical History  Diagnosis Date  . Diabetes mellitus     Type II  . Hypertension   . Gout   . Allergy     allergic rhinitis  . CAD (coronary artery disease)     pulmonary hypertension// Cardiac cath 2001//2004 carotid dopplers Normal  . CHF (congestive heart failure) ([place])     non ish. cardiomyopathy// Dr. Harrington Challenger cardiologist  . Cancer [facility]) 2002    Renal cell cancer partial nephrectomy  . GERD (gastroesophageal reflux disease) 2003    EGD polyp; gastritis HH//Dr. Carlean Purl GI  . Diverticula of intestine 2003    colonoscopy polyp; hem; divertic  . Cataract 2008    Right eye  . Personal history of kidney stones   . Hyperlipidemia     TRIG  . Osteopenia 2002    Dexa  . IBS (irritable bowel syndrome)   . Choledocholithiasis   . Transfusion history 1982    PUD/Bleed/Transfusion  . Osteoarthritis 07/12/2007/ 10/2004    spine and knees// LS Degen. changes   . Chronic kidney disease 05/2006    Renal failure ; hospitalized   Past Surgical History  Procedure Laterality Date  . Eye surgery  2008    Right eye cataract  . Cystoscopy w/ stone manipulation  2010- 2011    ERCPS and clearing of choledocholithiasis  . Ercp  2010  . Colonoscopy  09/2006    polyps  . Abdominal hysterectomy  1999    hysterectomy with bladder tack   Social History:  reports that she has never smoked. She does not have any smokeless tobacco history on file.  She reports that she does not drink alcohol or use illicit drugs. Patient lives at home with her family. She has moderate to severe dementia. She is incontinent of bowel and bladder. He does not always recognize family members. She is not always capable of letting her wants and needs known. She ambulates with a walker Allergies  Allergen Reactions  . Aspirin     REACTION: stomach upset  . Atorvastatin     REACTION: muscle pain  . Celecoxib     REACTION: stomach upset  . Fenofibrate     REACTION: muscle pain  .  Metformin     REACTION: diarrhea  . Moxifloxacin     REACTION: stomach upset  . Niacin     REACTION: muscle pain    Family History  Problem Relation Age of Onset  . Cancer Daughter     breast cancer  . Cancer Son 74    Esophageal Cancer  . Heart disease Daughter      Prior to Admission medications   Medication Sig Start Date End Date Taking? Authorizing Provider  acetaminophen (TYLENOL) 325 MG tablet Take 650 mg by mouth every 6 (six) hours as needed for headache.    Yes Historical Provider, MD  amLODipine (NORVASC) 5 MG tablet Take 1 tablet (5 mg total) by mouth 2 (two) times daily. 07/17/15  Yes Deboraha Sprang, MD  carvedilol (COREG) 25 MG tablet take 1 tablet by mouth twice a day with food 10/10/15  Yes Fay Records, MD  Cholecalciferol (VITAMIN D3) 2000 UNITS TABS Take 1 tablet by mouth daily.   Yes Historical Provider, MD  dicyclomine (BENTYL) 10 MG capsule take 1 capsule by mouth three times a day 10/10/15  Yes Abner Greenspan, MD  dorzolamide-timolol (COSOPT) 22.3-6.8 MG/ML ophthalmic solution Place 1 drop into the left eye 2 (two) times daily. As directed. 08/09/13  Yes Historical Provider, MD  esomeprazole (NEXIUM) 40 MG capsule Take 1 capsule (40 mg total) by mouth daily. 03/16/15  Yes Abner Greenspan, MD  gabapentin (NEURONTIN) 300 MG capsule Take 2 capsules (600 mg total) by mouth 2 (two) times daily. 03/16/15  Yes [place], MD  latanoprost (XALATAN) 0.005 % ophthalmic solution Place 1 drop into the left eye at bedtime.    Yes Historical Provider, MD  loperamide (LOPERAMIDE A-D) 2 MG tablet Take 2 mg by mouth 3 (three) times daily as needed for diarrhea or loose stools. As needed   Yes Historical Provider, MD  ramipril (ALTACE) 10 MG capsule Take 1 capsule (10 mg total) by mouth daily. 03/16/15  Yes Abner Greenspan, MD  simvastatin (ZOCOR) 20 MG tablet Take 1 tablet (20 mg total) by mouth daily. 03/16/15  Yes Abner Greenspan, MD  cephALEXin (KEFLEX) 500 MG capsule Take 1 capsule (500 mg  total) by mouth 4 (four) times daily. 12/21/15   Delman Kitten, MD   Physical Exam: Filed Vitals:   12/25/15 1200 12/25/15 1215 12/25/15 1230 12/25/15 1355  BP: 142/56 135/57 136/60 158/55  Pulse: 60 60 59 59  Temp:   97.6 F (36.4 C)   TempSrc:   Rectal   Resp: 16 16 16 16   Height:      Weight:      SpO2: 96% 99% 99% 99%    Wt Readings from Last 3 Encounters:  12/25/15 53.071 kg (117 lb)  12/21/15 54.432 kg (120 lb)  08/13/15 52.164 kg (115 lb)    General:  Appears calm and comfortable,  thin and frail Eyes: PERRL, normal lids, irises & conjunctiva ENT: grossly normal hearing, lips & tongue mucous membranes of the mouth are pink but dry no teeth Neck: no LAD, masses or thyromegaly Cardiovascular: RRR, no m/r/g. No LE edema. Pedal pulses present and palpable  Respiratory: CTA bilaterally, no w/r/r. Normal respiratory effort. Respirations somewhat shallow breath sounds are distant Abdomen: soft, ntnd is a bowel sounds no guarding or rebounding Skin: no rash or induration seen on limited exam to touch dry slightly pale Musculoskeletal: Poor muscle tone BUE/BLE joints without swelling/erythema Psychiatric: grossly normal mood and affect, speech fluent and appropriate Neurologic: grossly non-focal. Bilateral grip 5 out of 5 follows commands slowly oriented to self only           Labs on Admission:  Basic Metabolic Panel:  Recent Labs Lab 12/21/15 1111 12/25/15 1219  NA 139 141  K 4.7 4.4  CL 109 103  CO2 29 27  GLUCOSE 123* 181*  BUN 21* 20  CREATININE 0.80 1.04*  CALCIUM 8.9 9.1   Liver Function Tests:  Recent Labs Lab 12/25/15 1219  AST 19  ALT 11*  ALKPHOS 61  BILITOT 0.6  PROT 6.0*  ALBUMIN 3.5   No results for input(s): LIPASE, AMYLASE in the last 168 hours. No results for input(s): AMMONIA in the last 168 hours. CBC:  Recent Labs Lab 12/21/15 1111 12/25/15 1219  WBC 4.7 5.9  HGB 11.0* 11.4*  HCT 33.5* 36.5  MCV 88.5 90.8  PLT 122* 132*    Cardiac Enzymes:  Recent Labs Lab 12/21/15 1111  TROPONINI <0.03    BNP (last 3 results) No results for input(s): BNP in the last 8760 hours.  ProBNP (last 3 results) No results for input(s): PROBNP in the last 8760 hours.  CBG:  Recent Labs Lab 12/21/15 1121  GLUCAP 110*    Radiological Exams on Admission: Ct Head Wo Contrast  12/25/2015  CLINICAL DATA:  Syncope.  Headache. EXAM: CT HEAD WITHOUT CONTRAST TECHNIQUE: Contiguous axial images were obtained from the base of the skull through the vertex without intravenous contrast. COMPARISON:  Report of an exam of 06/04/2006.  Report not available. FINDINGS: Sinuses/Soft tissues: Hyperostosis frontalis interna. Multiple venous lakes within the calvarium. Clear paranasal sinuses and mastoid air cells. Intracranial: Expected cerebral volume loss for age. Moderate low density in the periventricular white matter likely related to small vessel disease. No mass lesion, hemorrhage, hydrocephalus, acute infarct, intra-axial, or extra-axial fluid collection. IMPRESSION: 1.  No acute intracranial abnormality. 2.  Cerebral atrophy and small vessel ischemic change. Electronically Signed   By: Abigail Miyamoto M.D.   On: 12/25/2015 13:38    EKG: Independently reviewed. Sinus rhythm Right bundle branch block Inferior infarct, old No significant change since  Assessment/Plan Principal Problem:   Syncope Active Problems:   Diabetes ([place])   Essential hypertension   CAD (coronary artery disease)   GERD (gastroesophageal reflux disease)   AKI (acute kidney injury) ([place])  #1. Syncope. Etiology uncertain but history and presentation support dehydration. CT of the head without acute abnormalities. Currently no signs of infection. No metabolic derangements. EKG with no acute changes. Initial troponin negative. Reportedly patient hypotensive in the field was given IV fluids and blood pressure proved -Admit to telemetry -Cycle troponin -Obtain a chest  x-ray and urinalysis -Obtain a 2-D echo -Gentle IV fluids -Check orthostatics in am -Hold sedating medications -Hold beta blocker and ACE inhibitor for now -Obtain he 12 and folate -Physical therapy. Patient also history  of recent fall  #2. Acute kidney injury. Mild. Creatinine 1.04. Home medications include ACE inhibitor -Hold nephrotoxins -Gentle IV fluids -Monitor urine output -Recheck in the morning  #3. Diabetes. Diet controlled. Serum glucose 181 on admission -Carb modified diet -Obtain a hemoglobin A1c -Sliding scale for optimal control  #4 hypertension. Reportedly blood pressure quite soft in the field. Improved after fluids. On admission blood pressure 158/55. Home medications include Reminyl, Coreg, amlodipine -We will hold Coreg and ramipril for now -We'll continue amlodipine -Monitor closely and resume home meds when indicated  #5. CAD. EKG without acute changes. Initial troponin negative. Patient denies chest pain. -Monitor on telemetry -Cycle troponin  #6. GERD. Stable at baseline   Code Status: DNR DVT Prophylaxis: Family Communication: daughter and son at bedside Disposition Plan: home 24 hours  Time spent: 102 minutes  [facility]

## 2015-12-25 NOTE — ED Notes (Signed)
Onset today sitting with daughter at a table patient complaint of a headache then syncope for approx 15 minutes. Arrived via EMS initial BP 76/50, repeat 86/56 then 120/Palpated. CBG 176. 0.9 NS 200 ML given. Alert to her normal now per family at bedside.

## 2015-12-25 NOTE — ED Notes (Signed)
Secretary paged due to no fax regarding the interrogation on patient's pacemaker.

## 2015-12-25 NOTE — ED Provider Notes (Signed)
CSN: [id number]     Arrival date & time 12/25/15  1135 History   First MD Initiated Contact with Patient 12/25/15 1136     Chief Complaint  Patient presents with  . Loss of Consciousness  . Weakness     (Consider location/radiation/quality/duration/timing/severity/associated sxs/prior Treatment) The history is provided by the patient.  [name] is a 82 y.o. female hx of DM, HTN, CHF with pacemaker, GERD, here with syncope, headache. Patient lives at home with family. She had acute onset of headache around 9 AM this morning and then she became unresponsive or about a minute or so. Did not hit her head. Family then called EMS who noticed that she was hypotensive 76/50. She was given 200 cc NS by EMS. Denies chest pain. Patient has a pacemaker. Patient fell several days ago and had a L 5th finger laceration and fracture.      Past Medical History  Diagnosis Date  . Diabetes mellitus     Type II  . Hypertension   . Gout   . Allergy     allergic rhinitis  . CAD (coronary artery disease)     pulmonary hypertension// Cardiac cath 2001//2004 carotid dopplers Normal  . CHF (congestive heart failure) ([place])     non ish. cardiomyopathy// Dr. Harrington Challenger cardiologist  . Cancer [facility]) 2002    Renal cell cancer partial nephrectomy  . GERD (gastroesophageal reflux disease) 2003    EGD polyp; gastritis HH//Dr. Carlean Purl GI  . Diverticula of intestine 2003    colonoscopy polyp; hem; divertic  . Cataract 2008    Right eye  . Personal history of kidney stones   . Hyperlipidemia     TRIG  . Osteopenia 2002    Dexa  . IBS (irritable bowel syndrome)   . Choledocholithiasis   . Transfusion history 1982    PUD/Bleed/Transfusion  . Osteoarthritis 07/12/2007/ 10/2004    spine and knees// LS Degen. changes   . Chronic kidney disease 05/2006    Renal failure ; hospitalized   Past Surgical History  Procedure Laterality Date  . Eye surgery  2008    Right eye cataract  . Cystoscopy w/ stone manipulation   2010- 2011    ERCPS and clearing of choledocholithiasis  . Ercp  2010  . Colonoscopy  09/2006    polyps  . Abdominal hysterectomy  1999    hysterectomy with bladder tack   Family History  Problem Relation Age of Onset  . Cancer Daughter     breast cancer  . Cancer Son 76    Esophageal Cancer  . Heart disease Daughter    Social History  Substance Use Topics  . Smoking status: Never Smoker   . Smokeless tobacco: None  . Alcohol Use: No   OB History    No data available     Review of Systems  Cardiovascular: Positive for syncope.  Neurological: Positive for weakness and headaches.  All other systems reviewed and are negative.     Allergies  Aspirin; Atorvastatin; Celecoxib; Fenofibrate; Metformin; Moxifloxacin; and Niacin  Home Medications   Prior to Admission medications   Medication Sig Start Date End Date Taking? Authorizing Provider  acetaminophen (TYLENOL) 325 MG tablet Take 650 mg by mouth every 6 (six) hours as needed for headache.     Historical Provider, MD  amLODipine (NORVASC) 5 MG tablet Take 1 tablet (5 mg total) by mouth 2 (two) times daily. 07/17/15   Deboraha Sprang, MD  carvedilol (COREG) 25  MG tablet take 1 tablet by mouth twice a day with food 10/10/15   Fay Records, MD  cephALEXin (KEFLEX) 500 MG capsule Take 1 capsule (500 mg total) by mouth 4 (four) times daily. 12/21/15   Delman Kitten, MD  Cholecalciferol (VITAMIN D3) 2000 UNITS TABS Take 1 tablet by mouth daily.    Historical Provider, MD  dicyclomine (BENTYL) 10 MG capsule take 1 capsule by mouth three times a day 10/10/15   Abner Greenspan, MD  dorzolamide-timolol (COSOPT) 22.3-6.8 MG/ML ophthalmic solution Place 1 drop into the left eye 2 (two) times daily. As directed. 08/09/13   Historical Provider, MD  esomeprazole (NEXIUM) 40 MG capsule Take 1 capsule (40 mg total) by mouth daily. 03/16/15   Abner Greenspan, MD  gabapentin (NEURONTIN) 300 MG capsule Take 2 capsules (600 mg total) by mouth 2 (two) times  daily. 03/16/15   Abner Greenspan, MD  latanoprost (XALATAN) 0.005 % ophthalmic solution Place 1 drop into the left eye at bedtime.     Historical Provider, MD  loperamide (LOPERAMIDE A-D) 2 MG tablet Take 2 mg by mouth 3 (three) times daily as needed for diarrhea or loose stools. As needed    Historical Provider, MD  ramipril (ALTACE) 10 MG capsule Take 1 capsule (10 mg total) by mouth daily. 03/16/15   Abner Greenspan, MD  simvastatin (ZOCOR) 20 MG tablet Take 1 tablet (20 mg total) by mouth daily. 03/16/15   Marne A [place], MD   BP 142/56 mmHg  Pulse 60  Resp 16  Ht 5\' 2"  (1.575 m)  Wt 117 lb (53.071 kg)  BMI 21.39 kg/m2  SpO2 94% Physical Exam  Constitutional: She is oriented to person, place, and time.  Chronically ill, NAD   HENT:  Head: Normocephalic.  Mouth/Throat: Oropharynx is clear and moist.  Eyes: Conjunctivae are normal. Pupils are equal, round, and reactive to light.  Neck: Normal range of motion. Neck supple.  Cardiovascular: Normal rate, regular rhythm and normal heart sounds.   Pulmonary/Chest: Effort normal and breath sounds normal. No respiratory distress. She has no wheezes. She has no rales.  Abdominal: Soft. Bowel sounds are normal. She exhibits no distension. There is no tenderness.  Musculoskeletal: Normal range of motion. She exhibits no edema or tenderness.  Neurological: She is alert and oriented to person, place, and time. No cranial nerve deficit. Coordination normal.  CN 2-12 intact, nl strength throughout   Skin: Skin is warm and dry.  Psychiatric: She has a normal mood and affect. Her behavior is normal. Judgment and thought content normal.  Nursing note and vitals reviewed.   ED Course  Procedures (including critical care time) Labs Review Labs Reviewed  CBC - Abnormal; Notable for the following:    Hemoglobin 11.4 (*)    Platelets 132 (*)    All other components within normal limits  URINALYSIS, ROUTINE W REFLEX MICROSCOPIC (NOT AT [facility])  COMPREHENSIVE  METABOLIC PANEL  CBG MONITORING, ED  I-STAT TROPOININ, ED    Imaging Review No results found. I have personally reviewed and evaluated these images and lab results as part of my medical decision-making.   EKG Interpretation   Date/Time:  Tuesday December 25 2015 11:54:28 EDT Ventricular Rate:  60 PR Interval:  129 QRS Duration: 147 QT Interval:  519 QTC Calculation: 519 R Axis:   -77 Text Interpretation:  Sinus rhythm Right bundle branch block Inferior  infarct, old No significant change since last tracing Confirmed by Darl Householder  MD, DAVID ([id number]) on 12/25/2015 12:01:19 PM      MDM   Final diagnoses:  None   [name] is a 82 y.o. female here with headache, hypotension that resolved. Consider subarachnoid vs dehydration vs syncope from hypotension. Will get CT head, labs. Has pacemaker so will interrogate pacemaker.   2:41 PM Labs at baseline. Called Dr. Leonel Ramsay regarding further neuro workup. Patient has pacemaker and can't get MRI. He felt that patient's likely passed out from hypotension and doesn't need further neurologic workup. Will admit for obs. Patient DNR.    Wandra Arthurs, MD 12/25/15 [phone]

## 2015-12-26 ENCOUNTER — Other Ambulatory Visit (HOSPITAL_COMMUNITY): Payer: Self-pay

## 2015-12-26 DIAGNOSIS — I10 Essential (primary) hypertension: Secondary | ICD-10-CM | POA: Diagnosis not present

## 2015-12-26 DIAGNOSIS — K219 Gastro-esophageal reflux disease without esophagitis: Secondary | ICD-10-CM

## 2015-12-26 DIAGNOSIS — R55 Syncope and collapse: Secondary | ICD-10-CM | POA: Diagnosis not present

## 2015-12-26 DIAGNOSIS — I2510 Atherosclerotic heart disease of native coronary artery without angina pectoris: Secondary | ICD-10-CM

## 2015-12-26 DIAGNOSIS — N3000 Acute cystitis without hematuria: Secondary | ICD-10-CM

## 2015-12-26 DIAGNOSIS — N179 Acute kidney failure, unspecified: Secondary | ICD-10-CM | POA: Diagnosis not present

## 2015-12-26 LAB — GLUCOSE, CAPILLARY
GLUCOSE-CAPILLARY: 143 mg/dL — AB (ref 65–99)
Glucose-Capillary: 75 mg/dL (ref 65–99)
Glucose-Capillary: 110 mg/dL — ABNORMAL HIGH (ref 65–99)
Glucose-Capillary: 78 mg/dL (ref 65–99)

## 2015-12-26 LAB — VITAMIN B12: VITAMIN B 12: 94 pg/mL — AB (ref 180–914)

## 2015-12-26 MED ORDER — CYANOCOBALAMIN 1000 MCG/ML IJ SOLN
1000.0000 ug | Freq: Every day | INTRAMUSCULAR | Status: DC
  Administered 2015-12-26 – 2015-12-27 (×2): 1000 ug via SUBCUTANEOUS
  Filled 2015-12-26 (×2): qty 1

## 2015-12-26 NOTE — Progress Notes (Signed)
[facility] Progress Note  Patient: [name] [id number]   PCP: Loura Pardon, MD DOB: 19-Dec-1923   DOA: 12/25/2015   DOS: 12/26/2015   Date of Service: the patient was seen and examined on 12/26/2015  Subjective: Patient denies having any acute complaint of the time of my evaluation no chest pain and abdominal pain and nausea and vomiting denies any dizziness or focal deficit. Nutrition: Oral diet very well  Brief hospital course: Patient was admitted on 12/25/2015, with complaint of passing out episode, was found to have low blood pressure on admission. Currently further plan is continue monitoring on telemetry.  Assessment and Plan: 1. Syncope Patient presented with a syncope, telemetry remains unremarkable. Pacemaker evaluation also does not show any evidence of acute arrhythmia. Echocardiogram is currently pending. Clinical examination does not show any evidence of neurological deficit. CT of the head was unremarkable for any stroke. Chest x-ray is also unremarkable. Patient was given IV fluid with the concern for possible dehydration due to poor oral intake as well as continuation of blood pressure medication. Currently medications are on hold.  2. Essential hypertension. Patient was given IV fluids and her blood pressure medications were on hold. At present I would discontinue IV fluid and patient will probably be discharged without any blood pressure medication given her blood pressure remains in an acceptable range at this point.  3. Swallowing difficulty. The family has mentioned to me that the patient while chewing will forget to swallow her food due to her dementia and will require further cueing. Since her hospitalization the patient does not have any concern regarding aspiration. Although the physical therapy has recommended speech therapy evaluation and therefore I will consult speech therapy for the patient.  4. Vitamin B 12 deficiency. B-12 significantly on a  lower side. We will give continuous B-12 injections while in hospital. Discharge on oral B-12 and recommended recheck in one month.  5. UTI. Patient has been on Keflex as an outpatient for a total of 10 days to be ended on 12/31/2015. We'll continue the current regimen.  6. Dementia. No significant agitation here in the hospital we'll continue to monitor.  Activity: physical therapy recommends home health as well as speech therapy consult inpatient. Bowel regimen: last BM 12/25/2015 DVT Prophylaxis: subcutaneous Heparin Nutrition: Speech therapy consult continue current diet Advance goals of care discussion: DNR DNI  Procedures: Echocardiogram pending Pacemaker evaluation Consultants: none Antibiotics: Anti-infectives    Start     Dose/Rate Route Frequency Ordered Stop   12/25/15 1800  cephALEXin (KEFLEX) capsule 500 mg     500 mg Oral 4 times daily 12/25/15 1653 12/30/15 2359   12/25/15 1530  cephALEXin (KEFLEX) capsule 500 mg  Status:  Discontinued     500 mg Oral 4 times daily 12/25/15 1518 12/25/15 1653       Family Communication: family was present at bedside, at the time of interview. The pt provided permission to discuss medical plan with the family. Opportunity was given to ask question and all questions were answered satisfactorily.   Disposition:  Expected discharge date:12/27/2015 Barriers to safe discharge: Pending echocardiogram as well as speech therapy consult   Intake/Output Summary (Last 24 hours) at 12/26/15 1919 Last data filed at 12/26/15 1300  Gross per 24 hour  Intake    480 ml  Output    825 ml  Net   -345 ml   Filed Weights   12/25/15 1159 12/25/15 1658 12/26/15 0503  Weight: 53.071 kg (117 lb)  53.298 kg (117 lb 8 oz) 50.213 kg (110 lb 11.2 oz)    Objective: Physical Exam: Filed Vitals:   12/25/15 1919 12/25/15 2309 12/26/15 0503 12/26/15 1326  BP: 115/80 116/45 131/91 128/57  Pulse: 60 60 60 59  Temp: 99.4 F (37.4 C) 98.7 F (37.1 C)  98.4 F (36.9 C) 98.2 F (36.8 C)  TempSrc: Oral Oral Oral Oral  Resp: 17 18 17 15   Height:      Weight:   50.213 kg (110 lb 11.2 oz)   SpO2: 97% 95% 98% 99%     General: Appear in mild distress, no Rash; Oral Mucosa moist. Cardiovascular: S1 and S2 Present, no Murmur, no JVD Respiratory: Bilateral Air entry present and Clear to Auscultation, no Crackles, no wheezes Abdomen: Bowel Sound present, Soft and no tenderness Extremities: n Pedal edema, ono calf tenderness Neurology: Grossly no focal neuro deficit.  Data Reviewed: CBC:  Recent Labs Lab 12/21/15 1111 12/25/15 1219  WBC 4.7 5.9  HGB 11.0* 11.4*  HCT 33.5* 36.5  MCV 88.5 90.8  PLT 122* Q000111Q*   Basic Metabolic Panel:  Recent Labs Lab 12/21/15 1111 12/25/15 1219  NA 139 141  K 4.7 4.4  CL 109 103  CO2 29 27  GLUCOSE 123* 181*  BUN 21* 20  CREATININE 0.80 1.04*  CALCIUM 8.9 9.1   Liver Function Tests:  Recent Labs Lab 12/25/15 1219  AST 19  ALT 11*  ALKPHOS 61  BILITOT 0.6  PROT 6.0*  ALBUMIN 3.5   No results for input(s): LIPASE, AMYLASE in the last 168 hours. No results for input(s): AMMONIA in the last 168 hours.  Cardiac Enzymes:  Recent Labs Lab 12/21/15 1111 12/25/15 1932  TROPONINI <0.03 <0.03    BNP (last 3 results) No results for input(s): BNP in the last 8760 hours.  CBG:  Recent Labs Lab 12/25/15 1708 12/25/15 2025 12/26/15 0758 12/26/15 1135 12/26/15 1606  GLUCAP 109* 126* 75 110* 78    No results found for this or any previous visit (from the past 240 hour(s)).   Studies: No results found.   Scheduled Meds: . amLODipine  5 mg Oral BID  . cephALEXin  500 mg Oral QID  . cyanocobalamin  1,000 mcg Subcutaneous Daily  . dicyclomine  10 mg Oral TID AC  . dorzolamide-timolol  1 drop Left Eye BID  . enoxaparin (LOVENOX) injection  30 mg Subcutaneous Q24H  . insulin aspart  0-9 Units Subcutaneous TID WC  . latanoprost  1 drop Left Eye QHS  . pantoprazole  40 mg  Oral Daily  . simvastatin  20 mg Oral QPM  . sodium chloride flush  3 mL Intravenous Q12H   Continuous Infusions:  PRN Meds: acetaminophen **OR** acetaminophen, bisacodyl, HYDROcodone-acetaminophen, LORazepam, ondansetron **OR** ondansetron (ZOFRAN) IV, polyethylene glycol, traZODone  Time spent: 30 minutes  Author: Berle Mull, MD [facility] Pager: [phone] 12/26/2015 7:19 PM  If 7PM-7AM, please contact night-coverage at [email or web address], password [facility]

## 2015-12-26 NOTE — Evaluation (Signed)
Physical Therapy Evaluation Patient Details Name: [name] MRN: [id number] DOB: [birth year] Today's Date: 12/26/2015   History of Present Illness  [name] is a 82 y.o. female with a Past Medical History of CAD, CHF, nephrolithiasis, IBS, Dementia, CKD who presents with unresponsive episode. No true syncope. Pt likely w/ progressive/advanced dementia lending to changes in her normal behavior patterns. May have also had vasovagal episode given BP of 76/50 reported by EMS. Additionally, pt fell several days ago w/ resultant Lt 5th digit fx and laceration.     Clinical Impression  Pt admitted with above diagnosis. Pt currently with functional limitations due to the deficits listed below (see PT Problem List). Ms. Gisi has excellent care at home from her daughter 24/7. She currently requires min assist for sit<>stand and stand pivot transfers due to generalize weakness and fatigue and declined ambulation. BP supine 137/45, sitting EOB 111/59, standing at bedside 131/41 (pt fidgeting, likely not accurate).  Pt reports feeling "a little" lightheaded when standing. Pt would benefit from Speech Therapy evaluation w/ chart review revealing pt "pocketing food" and daughter's report that she needs to eat food w/ the consistency of mashed potatoes.  Pt will benefit from skilled PT to increase their independence and safety with mobility to allow discharge to the venue listed below.      Follow Up Recommendations Home health PT;Supervision/Assistance - 24 hour    Equipment Recommendations  None recommended by PT    Recommendations for Other Services Speech consult;OT consult (Daughter reports pt needs consistency of mashed potatoes)     Precautions / Restrictions Precautions Precautions: Fall Precaution Comments: check BP; Lt 5th digit fx and laceration w/ dressing Restrictions Weight Bearing Restrictions: No      Mobility  Bed Mobility Overal bed mobility: Needs Assistance Bed Mobility: Supine to  Sit     Supine to sit: Min guard;HOB elevated     General bed mobility comments: Increased time w/ use of bed rail  Transfers Overall transfer level: Needs assistance Equipment used: 1 person hand held assist Transfers: Sit to/from [facility] Sit to Stand: Min assist Stand pivot transfers: Min assist       General transfer comment: Assist to steady during sit<>stand and stand pivot transfer.  Pt shuffles feet w/ flexed posture.  Ambulation/Gait             General Gait Details: Pt declined due to generalized weakness and fatigue  Stairs            Wheelchair Mobility    Modified Rankin (Stroke Patients Only)       Balance Overall balance assessment: Needs assistance;History of Falls Sitting-balance support: Bilateral upper extremity supported;Feet supported Sitting balance-Leahy Scale: Fair Sitting balance - Comments: Min guard for safety w/ Bil UE support on bed   Standing balance support: Single extremity supported;During functional activity Standing balance-Leahy Scale: Poor Standing balance comment: Relies on 1 person HHA for dynamic activities                             Pertinent Vitals/Pain Pain Assessment: No/denies pain    Home Living Family/patient expects to be discharged to:: Private residence Living Arrangements: Children Available Help at Discharge: Family;Available 24 hours/day Type of Home: House         Home Equipment: Walker - 4 wheels;Walker - standard;Cane - single point;Bedside commode;Shower seat Additional Comments: Lives w/ daughter    Prior Function Level of  Independence: Needs assistance   Gait / Transfers Assistance Needed: Uses standard walker inside the home and 4 wheeled walker in community.    ADL's / Homemaking Assistance Needed: Needs assist bathing to reach her back and with dressing        Hand Dominance        Extremity/Trunk Assessment   Upper Extremity Assessment:  Generalized weakness           Lower Extremity Assessment: Generalized weakness      Cervical / Trunk Assessment: Kyphotic  Communication   Communication: No difficulties  Cognition Arousal/Alertness: Awake/alert Behavior During Therapy: WFL for tasks assessed/performed Overall Cognitive Status: History of cognitive impairments - at baseline                      General Comments General comments (skin integrity, edema, etc.): BP supine 137/45, sitting EOB 111/59, standing at bedside 131/41 (pt fidgeting, likely not accurate).  Pt reports feeling "a little" lightheaded when standing    Exercises General Exercises - Lower Extremity Ankle Circles/Pumps: AROM;Both;10 reps;Seated      Assessment/Plan    PT Assessment Patient needs continued PT services  PT Diagnosis Difficulty walking;Generalized weakness   PT Problem List Decreased strength;Decreased activity tolerance;Decreased balance;Decreased mobility;Decreased safety awareness  PT Treatment Interventions DME instruction;Gait training;Functional mobility training;Therapeutic activities;Stair training;Therapeutic exercise;Balance training;Cognitive remediation;Patient/family education   PT Goals (Current goals can be found in the Care Plan section) Acute Rehab PT Goals Patient Stated Goal: to go home PT Goal Formulation: With patient/family Time For Goal Achievement: 01/09/16 Potential to Achieve Goals: Good    Frequency Min 3X/week   Barriers to discharge        Co-evaluation               End of Session Equipment Utilized During Treatment: Gait belt Activity Tolerance: Patient limited by fatigue Patient left: in chair;with call bell/phone within reach;with chair alarm set;with family/visitor present Nurse Communication: Mobility status;Other (comment) (BP; would benefit from ST evaluation)    Functional Assessment Tool Used: Clinical Judgement Functional Limitation: Mobility: Walking and moving  around Mobility: Walking and Moving Around Current Status [id number]): At least 20 percent but less than 40 percent impaired, limited or restricted Mobility: Walking and Moving Around Goal Status [phone]): At least 1 percent but less than 20 percent impaired, limited or restricted    Time: 1159-1219 PT Time Calculation (min) (ACUTE ONLY): 20 min   Charges:   PT Evaluation $PT Eval Low Complexity: 1 Procedure     PT G Codes:   PT G-Codes **NOT FOR INPATIENT CLASS** Functional Assessment Tool Used: Clinical Judgement Functional Limitation: Mobility: Walking and moving around Mobility: Walking and Moving Around Current Status [id number]): At least 20 percent but less than 40 percent impaired, limited or restricted Mobility: Walking and Moving Around Goal Status [phone]): At least 1 percent but less than 20 percent impaired, limited or restricted   Collie Siad PT, DPT  Pager: [phone] Phone: [phone] 12/26/2015, 1:13 PM

## 2015-12-26 NOTE — Care Management Obs Status (Signed)
[facility] NOTIFICATION   Patient Details  Name: [name] MRN: [id number] Date of Birth: [birth year]   Medicare Observation Status Notification Given:  Yes    Erenest Rasher, RN 12/26/2015, 4:32 PM

## 2015-12-27 ENCOUNTER — Other Ambulatory Visit (HOSPITAL_COMMUNITY): Payer: Self-pay

## 2015-12-27 ENCOUNTER — Observation Stay (HOSPITAL_BASED_OUTPATIENT_CLINIC_OR_DEPARTMENT_OTHER): Payer: Medicare Other

## 2015-12-27 ENCOUNTER — Institutional Professional Consult (permissible substitution): Payer: Self-pay | Admitting: Primary Care

## 2015-12-27 DIAGNOSIS — N179 Acute kidney failure, unspecified: Secondary | ICD-10-CM | POA: Diagnosis not present

## 2015-12-27 DIAGNOSIS — E538 Deficiency of other specified B group vitamins: Secondary | ICD-10-CM

## 2015-12-27 DIAGNOSIS — K219 Gastro-esophageal reflux disease without esophagitis: Secondary | ICD-10-CM | POA: Diagnosis not present

## 2015-12-27 DIAGNOSIS — R55 Syncope and collapse: Secondary | ICD-10-CM | POA: Diagnosis not present

## 2015-12-27 DIAGNOSIS — R269 Unspecified abnormalities of gait and mobility: Secondary | ICD-10-CM | POA: Diagnosis not present

## 2015-12-27 DIAGNOSIS — I10 Essential (primary) hypertension: Secondary | ICD-10-CM | POA: Diagnosis not present

## 2015-12-27 LAB — CBC WITH DIFFERENTIAL/PLATELET
BASOS ABS: 0 10*3/uL (ref 0.0–0.1)
BASOS PCT: 0 %
EOS ABS: 0.1 10*3/uL (ref 0.0–0.7)
Eosinophils Relative: 1 %
HEMATOCRIT: 34.3 % — AB (ref 36.0–46.0)
HEMOGLOBIN: 11.1 g/dL — AB (ref 12.0–15.0)
Lymphocytes Relative: 36 %
Lymphs Abs: 1.8 10*3/uL (ref 0.7–4.0)
MCH: 28.8 pg (ref 26.0–34.0)
MCHC: 32.4 g/dL (ref 30.0–36.0)
MCV: 89.1 fL (ref 78.0–100.0)
Monocytes Absolute: 0.4 10*3/uL (ref 0.1–1.0)
Monocytes Relative: 8 %
NEUTROS ABS: 2.7 10*3/uL (ref 1.7–7.7)
NEUTROS PCT: 55 %
Platelets: 125 10*3/uL — ABNORMAL LOW (ref 150–400)
RBC: 3.85 MIL/uL — ABNORMAL LOW (ref 3.87–5.11)
RDW: 13.5 % (ref 11.5–15.5)
WBC: 5 10*3/uL (ref 4.0–10.5)

## 2015-12-27 LAB — ECHOCARDIOGRAM COMPLETE
Height: 62 in
Weight: 1800 oz

## 2015-12-27 LAB — RENAL FUNCTION PANEL
ALBUMIN: 3.2 g/dL — AB (ref 3.5–5.0)
ANION GAP: 10 (ref 5–15)
BUN: 16 mg/dL (ref 6–20)
CALCIUM: 9.3 mg/dL (ref 8.9–10.3)
CO2: 25 mmol/L (ref 22–32)
CREATININE: 0.92 mg/dL (ref 0.44–1.00)
Chloride: 106 mmol/L (ref 101–111)
GFR, EST NON AFRICAN AMERICAN: 54 mL/min — AB (ref >60)
Glucose, Bld: 119 mg/dL — ABNORMAL HIGH (ref 65–99)
PHOSPHORUS: 3.1 mg/dL (ref 2.5–4.6)
Potassium: 4.2 mmol/L (ref 3.5–5.1)
SODIUM: 141 mmol/L (ref 135–145)

## 2015-12-27 LAB — FOLATE RBC
FOLATE, RBC: 1076 ng/mL (ref >498)
Folate, Hemolysate: 396.1 ng/mL
HEMATOCRIT: 36.8 % (ref 34.0–46.6)

## 2015-12-27 LAB — GLUCOSE, CAPILLARY
GLUCOSE-CAPILLARY: 103 mg/dL — AB (ref 65–99)
GLUCOSE-CAPILLARY: 141 mg/dL — AB (ref 65–99)

## 2015-12-27 LAB — HEMOGLOBIN A1C
HEMOGLOBIN A1C: 6.3 % — AB (ref 4.8–5.6)
Mean Plasma Glucose: 134 mg/dL

## 2015-12-27 MED ORDER — VITAMIN B-12 1000 MCG PO TABS
1000.0000 ug | ORAL_TABLET | Freq: Every day | ORAL | Status: DC
Start: 2015-12-27 — End: 2015-12-30

## 2015-12-27 MED ORDER — CARVEDILOL 3.125 MG PO TABS: 3.1250 mg | ORAL_TABLET | Freq: Two times a day (BID) | ORAL | Status: DC

## 2015-12-27 MED ORDER — RAMIPRIL 2.5 MG PO CAPS: 2.5000 mg | ORAL_CAPSULE | Freq: Every day | ORAL | Status: DC

## 2015-12-27 MED ORDER — CARVEDILOL 6.25 MG PO TABS: 6.2500 mg | ORAL_TABLET | Freq: Two times a day (BID) | ORAL | Status: DC

## 2015-12-27 NOTE — Care Management Note (Addendum)
Case Management Note  Patient Details  Name: [name] MRN: [id number] Date of Birth: 02-04-1924  Subjective/Objective:    Syncope                 Action/Plan: Discharge Planning:    NCM spoke to pt and pt's dtr, [name] and son, [name] at bedside. Dtr states pt has RW at home. Offered choice for [place] list provided. Dtr requested [facility]. Contacted [facility] with new referral they would not be able to do start of care until Wednesday. Spoke to dtr and she was agreeable to start of care for Wednesday. [facility] delivered 3n1 bedside commode to pt's room. Pt can afford medications at home. Faxed referral to [facility], [phone].  PCP --Christina Greenspan MD  Expected Discharge Date:  12/27/2015             Expected Discharge Plan:  [place]  In-House Referral:  NA  Discharge planning Services  CM Consult  Post Acute Care Choice:  Home Health Choice offered to:  Adult Children  DME Arranged:  3-N-1 DME Agency:  [place]:  PT [place] Agency:     Status of Service:  Completed, signed off  Medicare Important Message Given:    Date Medicare IM Given:    Medicare IM give by:    Date Additional Medicare IM Given:    Additional Medicare Important Message give by:     If discussed at [place] of Stay Meetings, dates discussed:    Additional Comments:  Erenest Rasher, RN 12/27/2015, 2:00 PM

## 2015-12-27 NOTE — Progress Notes (Signed)
Patient discharge paperwork gone over with patient and family in detail. All questions answered to patient satisfaction. Telemetry discontinued and iv removed intact. Patient discharged to home with family by way of wheelchair.

## 2015-12-27 NOTE — Progress Notes (Signed)
  Echocardiogram 2D Echocardiogram has been performed.  [name] 12/27/2015, 2:22 PM

## 2015-12-27 NOTE — Evaluation (Signed)
Clinical/Bedside Swallow Evaluation Patient Details  Name: [name] MRN: [id number] Date of Birth: 1924-02-16  Today's Date: 12/27/2015 Time: SLP Start Time (ACUTE ONLY): 1021 SLP Stop Time (ACUTE ONLY): 1036 SLP Time Calculation (min) (ACUTE ONLY): 15 min  Past Medical History:  Past Medical History  Diagnosis Date  . Hypertension   . Gout   . Allergy     allergic rhinitis  . CAD (coronary artery disease)     pulmonary hypertension// Cardiac cath 2001//2004 carotid dopplers Normal  . CHF (congestive heart failure) ([place])     non ish. cardiomyopathy// Dr. Harrington Challenger cardiologist  . GERD (gastroesophageal reflux disease) 2003    EGD polyp; gastritis HH//Dr. Carlean Purl [facility]  . Diverticula of intestine 2003    colonoscopy polyp; hem; divertic  . Hyperlipidemia     TRIG  . Osteopenia 2002    Dexa  . IBS (irritable bowel syndrome)   . Choledocholithiasis   . History of blood transfusion 1982    "related to bleeding stomach ulcers"  . Presence of permanent cardiac pacemaker   . Myocardial infarction [facility]) dx'd 2003    "evidence of one"  . Type II diabetes mellitus ([place])   . Pneumonia     "not since 1990s" (12/25/2015)  . History of bleeding peptic ulcer   . Daily headache     "lately" (12/25/2015)  . Osteoarthritis 07/12/2007/ 10/2004    spine and knees// [facility]. changes   . Arthritis     "hands, back, right knee" (12/25/2015)  . History of gout   . Chronic kidney disease 05/2006    Renal failure ; hospitalized  . Kidney stones   . Renal cell cancer ([place]) 2002    Renal cell cancer partial nephrectomy  . Hx of recurrent urinary tract infection   . Dementia    Past Surgical History:  Past Surgical History  Procedure Laterality Date  . Cataract extraction w/ intraocular lens implant Right 12/2006  . Cystoscopy w/ stone manipulation  2010- 2011    ERCPS and clearing of choledocholithiasis  . Ercp  05/2009; 07/2009; 09/2009    "gallstones in her bile duct q time"  . Colonoscopy   09/2006  . Abdominal hysterectomy  1994  . Incontinence surgery  1994  . Cholecystectomy  1995  . Partial nephrectomy Right 05/2001    cancer  . Bi-ventricular implantable cardioverter defibrillator  (crt-d)  03/2002  . Pacemaker lead removal  09/2003; 05/2006  . Cataract extraction w/ intraocular lens implant Left 02/2013  . Colonoscopy w/ polypectomy  07/2002; 09/2006; 02/2009  . Insert / replace / remove pacemaker  06/2007; 06/2011    "no defibrillator"   HPI:  82 y.o. female with a Past Medical History of CAD, CHF, nephrolithiasis, IBS, Dementia, CKD who presents with unresponsive episode. No true syncope. Pt likely w/ progressive/advanced dementia lending to changes in her normal behavior patterns. May have also had vasovagal episode given BP of 76/50 reported by EMS. Additionally, pt fell several days ago w/ resultant Lt 5th digit fx and laceration.     Assessment / Plan / Recommendation Clinical Impression  Pt's oropharyngeal swallow appears WFL with thin liquids and purees, although family reports that mastication is limited due to pt being edentulous. At home they soften foods and cut them into smaller pieces for her, which is what they have been doing with the meal trays here. Discussed options for softer diet versus contuing unrestricted diet to be able to order what foods they know she  can handle, and pt/family prefer the latter. Will continue current diet, no further SLP f/u indicated.    Aspiration Risk  Mild aspiration risk    Diet Recommendation Regular;Thin liquid   Liquid Administration via: Cup;Straw Medication Administration: Whole meds with liquid Supervision: Patient able to self feed;Full supervision/cueing for compensatory strategies Compensations: Minimize environmental distractions;Slow rate;Small sips/bites Postural Changes: Seated upright at 90 degrees    Other  Recommendations Oral Care Recommendations: Oral care BID   Follow up Recommendations  24 hour  supervision/assistance    Frequency and Duration            Prognosis        Swallow Study   General HPI: 82 y.o. female with a Past Medical History of CAD, CHF, nephrolithiasis, IBS, Dementia, CKD who presents with unresponsive episode. No true syncope. Pt likely w/ progressive/advanced dementia lending to changes in her normal behavior patterns. May have also had vasovagal episode given BP of 76/50 reported by EMS. Additionally, pt fell several days ago w/ resultant Lt 5th digit fx and laceration.   Type of Study: Bedside Swallow Evaluation Previous Swallow Assessment: none in chart Diet Prior to this Study: Regular;Thin liquids Temperature Spikes Noted: No Respiratory Status: Room air History of Recent Intubation: No Behavior/Cognition: Alert;Cooperative;Pleasant mood Oral Cavity Assessment: Within Functional Limits Oral Care Completed by SLP: No Oral Cavity - Dentition: Edentulous Vision: Functional for self-feeding Self-Feeding Abilities: Able to feed self Patient Positioning: Upright in bed Baseline Vocal Quality: Normal    Oral/Motor/Sensory Function Overall Oral Motor/Sensory Function: Within functional limits   Ice Chips Ice chips: Not tested   Thin Liquid Thin Liquid: Within functional limits Presentation: Self Fed;Straw    Nectar Thick Nectar Thick Liquid: Not tested   Honey Thick Honey Thick Liquid: Not tested   Puree Puree: Within functional limits Presentation: Self Fed;Spoon   Solid   GO   Solid: Not tested    Functional Assessment Tool Used: skilled clinical judgment Functional Limitations: Swallowing Swallow Current Status [id number]): At least 20 percent but less than 40 percent impaired, limited or restricted Swallow Goal Status [phone]): At least 20 percent but less than 40 percent impaired, limited or restricted Swallow Discharge Status [phone]): At least 20 percent but less than 40 percent impaired, limited or restricted   Germain Osgood, M.A.  CCC-SLP [phone]  Germain Osgood 12/27/2015,10:44 AM

## 2015-12-27 NOTE — Progress Notes (Signed)
Physical Therapy Treatment Patient Details Name: [name] MRN: [id number] DOB: Nov 06, 1923 Today's Date: 12/27/2015    History of Present Illness [name] is a 82 y.o. female with a Past Medical History of CAD, CHF, nephrolithiasis, IBS, Dementia, CKD who presents with unresponsive episode. No true syncope. Pt likely w/ progressive/advanced dementia lending to changes in her normal behavior patterns. May have also had vasovagal episode given BP of 76/50 reported by EMS. Additionally, pt fell several days ago w/ resultant Lt 5th digit fx and laceration.     PT Comments    [name] made good progress today, ambulating w/ RW and min assist in hallway.  Family present during session and very supportive and involved.    Follow Up Recommendations  Home health PT;Supervision/Assistance - 24 hour     Equipment Recommendations  None recommended by PT    Recommendations for Other Services OT consult     Precautions / Restrictions Precautions Precautions: Fall Precaution Comments: Lt 5th digit fx and laceration w/ dressing Restrictions Weight Bearing Restrictions: No    Mobility  Bed Mobility Overal bed mobility: Needs Assistance Bed Mobility: Supine to Sit     Supine to sit: HOB elevated;Min assist     General bed mobility comments: Increased time and pt requires min assist as she is hesitant after being told all day that she needs to stay in bed  Transfers Overall transfer level: Needs assistance Equipment used: Rolling walker (2 wheeled) Transfers: Sit to/from Stand Sit to Stand: Min assist         General transfer comment: Assist to steady during sit<>stand and pt slow to stand.  Cues for hand placement on RW.  Ambulation/Gait Ambulation/Gait assistance: Min assist Ambulation Distance (Feet): 100 Feet Assistive device: Rolling walker (2 wheeled) Gait Pattern/deviations: Step-through pattern;Decreased stride length;Trunk flexed   Gait velocity interpretation: Below  normal speed for age/gender General Gait Details: Min assist managing RW when turning. Pushes RW too far ahead w/ flexed posture (her baseline per daughter).   Stairs            Wheelchair Mobility    Modified Rankin (Stroke Patients Only)       Balance Overall balance assessment: Needs assistance Sitting-balance support: Feet supported;Bilateral upper extremity supported Sitting balance-Leahy Scale: Fair Sitting balance - Comments: Min guard for safety w/ Bil UE support on bed   Standing balance support: Bilateral upper extremity supported;During functional activity Standing balance-Leahy Scale: Poor Standing balance comment: Relies on UE support for balance                    Cognition Arousal/Alertness: Awake/alert Behavior During Therapy: WFL for tasks assessed/performed Overall Cognitive Status: History of cognitive impairments - at baseline                      Exercises      General Comments        Pertinent Vitals/Pain Pain Assessment: No/denies pain    Home Living                      Prior Function            PT Goals (current goals can now be found in the care plan section) Acute Rehab PT Goals Patient Stated Goal: none stated PT Goal Formulation: With patient/family Time For Goal Achievement: 01/09/16 Potential to Achieve Goals: Good Progress towards PT goals: Progressing toward goals    Frequency  Min 3X/week    PT Plan Current plan remains appropriate    Co-evaluation             End of Session Equipment Utilized During Treatment: Gait belt Activity Tolerance: Patient tolerated treatment well Patient left: in chair;with call bell/phone within reach;with chair alarm set;with family/visitor present;Other (comment) (w/ MD in room)     Time: [id number] PT Time Calculation (min) (ACUTE ONLY): 17 min  Charges:  $Gait Training: 8-22 mins                    G Codes:      Collie Siad PT, DPT  Pager:  [phone] Phone: [phone] 12/27/2015, 4:01 PM

## 2015-12-27 NOTE — Discharge Summary (Signed)
[facility] Discharge Summary   Patient: [name] [id number]   PCP: Loura Pardon, MD DOB: [birth year]   Date of admission: 12/25/2015   Date of discharge:  12/27/2015    Discharge Diagnoses:  Principal Problem:   Syncope Active Problems:   Diabetes ([place])   Essential hypertension   CAD (coronary artery disease)   GERD (gastroesophageal reflux disease)   AKI (acute kidney injury) ([place])   Unresponsiveness   Urinary tract infectious disease   Lewy body dementia with behavioral disturbance  Recommendations for Outpatient Follow-up:  1. Follow-up with PCP in 1 week regarding adjustment of blood pressure medications   Follow-up Information    Follow up with Loura Pardon, MD. Schedule an appointment as soon as possible for a visit in 1 week.   Specialties:  Family Medicine, Radiology   Contact information:   [address] [place] [address] [phone]       Follow up with [facility].   Why:  Home Health Physical Therapy   Contact information:   [place] [address] [phone]      Diet recommendation: Cardiac diet  Activity: The patient is advised to gradually reintroduce usual activities.  Discharge Condition: good  History of present illness: As per the H and P dictated on admission, "[name] is a very pleasant slightly demented 82 y.o. female with a past medical history that includes diabetes, hypertension, CAD, CHF with pacemaker, GERD, IBS, chronic kidney disease presents to the emergency department with chief complaint of loss of consciousness complaints of headache. Initial evaluation concerning for syncope  Information is obtained from the patient and family who are at the bedside. Patient lives with her daughter who states they were sitting at the table this morning and daughter reports patient suddenly put her hand to her for head complained of a headache and then "lay her head on the  table and would not respond". Reports patient's eyes were closed respirations were steady she did not fall the chair. Loss of bladder or bowel control. The episode lasted about a minute. The daughter called EMS he reported the patient's blood pressure was 76/50. She was given normal saline. Patient denies any chest pain palpitation visual changes dizziness. She denies any abdominal pain nausea vomiting diarrhea constipation. The daughter reports that the patient's appetite is only fair and that "she really doesn't drink much water". Daughter also reports patient is incontinent of urine and that urine has been more concentrated of late. No report of any fever chills cough lower extremity edema or orthopnea. Patient did fall several days ago and had laceration on her left hand with fracture. Patient has been taking very small amounts of pain medicine for this injury. Addition she has been on Keflex for 4 days or UTI.  In the emergency department she is afebrile hemodynamically stable and not hypoxic. "  Hospital Course:  Summary of her active problems in the hospital is as following. 1. Syncope Patient presented with a syncope, telemetry remains unremarkable. Pacemaker evaluation also does not show any evidence of acute arrhythmia. Echocardiogram shows no significant valvular dysfunction, EF [id number] with diastolic dysfunction. Clinical examination does not show any evidence of neurological deficit. CT of the head was unremarkable for any stroke. Chest x-ray is also unremarkable. Patient was given IV fluid with the concern for possible dehydration due to poor oral intake as well as low blood pressure due to continuation of blood  pressure medication.  2. Essential hypertension. Patient was given IV fluids and her blood pressure medications were on hold. Given that the patient had mild elevation of the blood pressure and was on significantly high dose of antihypertensive medication, I would discharge the  patient on half dose of amlodipine. I would also lowered her dose of Coreg to 6.25 mg twice a from 25 mg twice a. Also I would lower her ramipril to 2.5 mg daily.  3. Swallowing difficulty. The family has mentioned to me that the patient while chewing will forget to swallow her food due to her dementia and will require further cueing. Since her hospitalization the patient does not have any concern regarding aspiration. Speech therapy recommends a regular diet.  4. Vitamin B 12 deficiency. B-12 significantly on a lower side. We will give continuous B-12 injections while in hospital. Discharge on oral B-12 and recommended recheck in one month.  5. UTI. Patient has been on Keflex as an outpatient for a total of 10 days to be ended on 12/31/2015. We'll continue the current regimen.  6. Dementia. No significant agitation here in the hospital we'll continue to monitor.  All other chronic medical condition were stable during the hospitalization.  Patient was seen by physical therapy, who recommended home health, which was arranged by Education officer, museum and case Freight forwarder. On the day of the discharge the patient's vitals were stable, and no other acute medical condition were reported by patient. the patient was felt safe to be discharge at home with home health.  Procedures and Results:  Echocardiogram  Study Conclusions  - Left ventricle: The cavity size was normal. Wall thickness was  increased in a pattern of mild LVH. Systolic function was mildly  reduced. The estimated ejection fraction was in the range of 45%  to 50%. Doppler parameters are consistent with abnormal left  ventricular relaxation (grade 1 diastolic dysfunction). - Mitral valve: Mildly calcified annulus. Mildly thickened leaflets  . - Right atrium: The atrium was mildly dilated. - Pulmonary arteries: PA peak pressure: 35 mm Hg (S).   Pacemaker evaluation No significant event  Consultations:  None  DISCHARGE  MEDICATION: Discharge Medication List as of 12/27/2015  4:54 PM    CONTINUE these medications which have CHANGED   Details  carvedilol (COREG) 6.25 MG tablet Take 1 tablet (6.25 mg total) by mouth 2 (two) times daily with a meal., Starting 12/27/2015, Until Discontinued, Normal    ramipril (ALTACE) 2.5 MG capsule Take 1 capsule (2.5 mg total) by mouth daily., Starting 12/27/2015, Until Discontinued, Normal      CONTINUE these medications which have NOT CHANGED   Details  acetaminophen (TYLENOL) 325 MG tablet Take 650 mg by mouth every 6 (six) hours as needed for headache. , Until Discontinued, Historical Med    amLODipine (NORVASC) 5 MG tablet Take 1 tablet (5 mg total) by mouth 2 (two) times daily., Starting 07/17/2015, Until Discontinued, Normal    Cholecalciferol (VITAMIN D3) 2000 UNITS TABS Take 1 tablet by mouth daily., Until Discontinued, Historical Med    dicyclomine (BENTYL) 10 MG capsule take 1 capsule by mouth three times a day, Normal    dorzolamide-timolol (COSOPT) 22.3-6.8 MG/ML ophthalmic solution Place 1 drop into the left eye 2 (two) times daily. As directed., Starting 08/09/2013, Until Discontinued, Historical Med    esomeprazole (NEXIUM) 40 MG capsule Take 1 capsule (40 mg total) by mouth daily., Starting 03/16/2015, Until Discontinued, Normal    gabapentin (NEURONTIN) 300 MG capsule Take 2 capsules (  600 mg total) by mouth 2 (two) times daily., Starting 03/16/2015, Until Discontinued, Normal    latanoprost (XALATAN) 0.005 % ophthalmic solution Place 1 drop into the left eye at bedtime. , Until Discontinued, Historical Med    loperamide (LOPERAMIDE A-D) 2 MG tablet Take 2 mg by mouth 3 (three) times daily as needed for diarrhea or loose stools. As needed, Until Discontinued, Historical Med    simvastatin (ZOCOR) 20 MG tablet Take 1 tablet (20 mg total) by mouth daily., Starting 03/16/2015, Until Discontinued, Normal    cephALEXin (KEFLEX) 500 MG capsule Take 1 capsule (500 mg  total) by mouth 4 (four) times daily., Starting 12/21/2015, Until Discontinued, Print       Allergies  Allergen Reactions  . Aspirin     REACTION: stomach upset  . Atorvastatin     REACTION: muscle pain  . Celecoxib     REACTION: stomach upset  . Fenofibrate     REACTION: muscle pain  . Metformin     REACTION: diarrhea  . Moxifloxacin     REACTION: stomach upset  . Niacin     REACTION: muscle pain   Discharge Instructions    Diet - low sodium heart healthy    Complete by:  As directed      Increase activity slowly    Complete by:  As directed           Discharge Exam: Filed Weights   12/25/15 1658 12/26/15 0503 12/27/15 0335  Weight: 53.298 kg (117 lb 8 oz) 50.213 kg (110 lb 11.2 oz) 51.03 kg (112 lb 8 oz)   Filed Vitals:   12/27/15 0600 12/27/15 1024  BP: 172/101 159/54  Pulse: 60   Temp: 99.1 F (37.3 C)   Resp: 18    General: Appear in no distress, no Rash; Oral Mucosa moist. Cardiovascular: S1 and S2 Present, no Murmur, no JVD Respiratory: Bilateral Air entry present and Clear to Auscultation, no Crackles, no wheezes Abdomen: Bowel Sound present, Soft and no tenderness Extremities: no Pedal edema, no calf tenderness Neurology: Grossly no focal neuro deficit.  The results of significant diagnostics from this hospitalization (including imaging, microbiology, ancillary and laboratory) are listed below for reference.    Significant Diagnostic Studies: Ct Head Wo Contrast  12/25/2015  CLINICAL DATA:  Syncope.  Headache. EXAM: CT HEAD WITHOUT CONTRAST TECHNIQUE: Contiguous axial images were obtained from the base of the skull through the vertex without intravenous contrast. COMPARISON:  Report of an exam of 06/04/2006.  Report not available. FINDINGS: Sinuses/Soft tissues: Hyperostosis frontalis interna. Multiple venous lakes within the calvarium. Clear paranasal sinuses and mastoid air cells. Intracranial: Expected cerebral volume loss for age. Moderate low density  in the periventricular white matter likely related to small vessel disease. No mass lesion, hemorrhage, hydrocephalus, acute infarct, intra-axial, or extra-axial fluid collection. IMPRESSION: 1.  No acute intracranial abnormality. 2.  Cerebral atrophy and small vessel ischemic change. Electronically Signed   By: Abigail Miyamoto M.D.   On: 12/25/2015 13:38   Portable Chest 1 View  12/25/2015  CLINICAL DATA:  Syncope and weakness EXAM: PORTABLE CHEST 1 VIEW COMPARISON:  07/07/2011 FINDINGS: Cardiac shadow is within normal limits. Defibrillator is again noted and stable. Lungs are well aerated bilaterally. No acute bony abnormality is seen. IMPRESSION: No active disease. Electronically Signed   By: Inez Catalina M.D.   On: 12/25/2015 16:44   Dg Finger Little Left  12/21/2015  CLINICAL DATA:  Pain after fall EXAM: LEFT LITTLE FINGER 2+V  COMPARISON:  None. FINDINGS: There is a comminuted displaced fracture of the fifth middle phalanx. No other acute abnormalities. IMPRESSION: Comminuted displaced fracture of the fifth middle phalanx Electronically Signed   By: Dorise Bullion III M.D   On: 12/21/2015 13:13    Microbiology: No results found for this or any previous visit (from the past 240 hour(s)).   Labs: CBC:  Recent Labs Lab 12/21/15 1111 12/25/15 1219 12/25/15 1953 12/27/15 0351  WBC 4.7 5.9  --  5.0  NEUTROABS  --   --   --  2.7  HGB 11.0* 11.4*  --  11.1*  HCT 33.5* 36.5 36.8 34.3*  MCV 88.5 90.8  --  89.1  PLT 122* 132*  --  0000000*   Basic Metabolic Panel:  Recent Labs Lab 12/21/15 1111 12/25/15 1219 12/27/15 0357  NA 139 141 141  K 4.7 4.4 4.2  CL 109 103 106  CO2 29 27 25   GLUCOSE 123* 181* 119*  BUN 21* 20 16  CREATININE 0.80 1.04* 0.92  CALCIUM 8.9 9.1 9.3  PHOS  --   --  3.1   Liver Function Tests:  Recent Labs Lab 12/25/15 1219 12/27/15 0357  AST 19  --   ALT 11*  --   ALKPHOS 61  --   BILITOT 0.6  --   PROT 6.0*  --   ALBUMIN 3.5 3.2*   No results for  input(s): LIPASE, AMYLASE in the last 168 hours. No results for input(s): AMMONIA in the last 168 hours. Cardiac Enzymes:  Recent Labs Lab 12/21/15 1111 12/25/15 1932  TROPONINI <0.03 <0.03   BNP (last 3 results) No results for input(s): BNP in the last 8760 hours. CBG:  Recent Labs Lab 12/26/15 1135 12/26/15 1606 12/26/15 2010 12/27/15 0740 12/27/15 1133  GLUCAP 110* 78 143* 103* 141*   Time spent: 30 minutes  Signed:  Rilei Kravitz  [facility]  12/27/2015  , 4:51 PM

## 2015-12-28 ENCOUNTER — Observation Stay (HOSPITAL_COMMUNITY): Payer: Medicare Other

## 2015-12-28 ENCOUNTER — Observation Stay (HOSPITAL_COMMUNITY)
Admission: EM | Admit: 2015-12-28 | Discharge: 2015-12-30 | Disposition: A | Discharge status: Home / Self Care | Payer: Medicare Other | Attending: Family Medicine | Admitting: Family Medicine

## 2015-12-28 ENCOUNTER — Encounter (HOSPITAL_COMMUNITY): Payer: Self-pay

## 2015-12-28 DIAGNOSIS — R627 Adult failure to thrive: Principal | ICD-10-CM

## 2015-12-28 DIAGNOSIS — F0390 Unspecified dementia without behavioral disturbance: Secondary | ICD-10-CM | POA: Insufficient documentation

## 2015-12-28 DIAGNOSIS — N179 Acute kidney failure, unspecified: Secondary | ICD-10-CM | POA: Diagnosis not present

## 2015-12-28 DIAGNOSIS — I10 Essential (primary) hypertension: Secondary | ICD-10-CM | POA: Diagnosis present

## 2015-12-28 DIAGNOSIS — I130 Hypertensive heart and chronic kidney disease with heart failure and stage 1 through stage 4 chronic kidney disease, or unspecified chronic kidney disease: Secondary | ICD-10-CM | POA: Insufficient documentation

## 2015-12-28 DIAGNOSIS — I5032 Chronic diastolic (congestive) heart failure: Secondary | ICD-10-CM | POA: Insufficient documentation

## 2015-12-28 DIAGNOSIS — E118 Type 2 diabetes mellitus with unspecified complications: Secondary | ICD-10-CM | POA: Diagnosis not present

## 2015-12-28 DIAGNOSIS — R0682 Tachypnea, not elsewhere classified: Secondary | ICD-10-CM

## 2015-12-28 DIAGNOSIS — Z515 Encounter for palliative care: Secondary | ICD-10-CM | POA: Insufficient documentation

## 2015-12-28 DIAGNOSIS — I252 Old myocardial infarction: Secondary | ICD-10-CM | POA: Diagnosis not present

## 2015-12-28 DIAGNOSIS — Z66 Do not resuscitate: Secondary | ICD-10-CM | POA: Insufficient documentation

## 2015-12-28 DIAGNOSIS — G9340 Encephalopathy, unspecified: Secondary | ICD-10-CM

## 2015-12-28 DIAGNOSIS — R404 Transient alteration of awareness: Secondary | ICD-10-CM | POA: Diagnosis not present

## 2015-12-28 DIAGNOSIS — Z950 Presence of cardiac pacemaker: Secondary | ICD-10-CM | POA: Diagnosis not present

## 2015-12-28 DIAGNOSIS — E119 Type 2 diabetes mellitus without complications: Secondary | ICD-10-CM | POA: Diagnosis not present

## 2015-12-28 DIAGNOSIS — N183 Chronic kidney disease, stage 3 (moderate): Secondary | ICD-10-CM | POA: Diagnosis not present

## 2015-12-28 DIAGNOSIS — E785 Hyperlipidemia, unspecified: Secondary | ICD-10-CM | POA: Insufficient documentation

## 2015-12-28 DIAGNOSIS — I2510 Atherosclerotic heart disease of native coronary artery without angina pectoris: Secondary | ICD-10-CM | POA: Insufficient documentation

## 2015-12-28 DIAGNOSIS — R531 Weakness: Secondary | ICD-10-CM | POA: Diagnosis not present

## 2015-12-28 DIAGNOSIS — R930 Abnormal findings on diagnostic imaging of skull and head, not elsewhere classified: Secondary | ICD-10-CM | POA: Diagnosis not present

## 2015-12-28 DIAGNOSIS — K219 Gastro-esophageal reflux disease without esophagitis: Secondary | ICD-10-CM | POA: Diagnosis not present

## 2015-12-28 DIAGNOSIS — S299XXA Unspecified injury of thorax, initial encounter: Secondary | ICD-10-CM | POA: Diagnosis not present

## 2015-12-28 DIAGNOSIS — I442 Atrioventricular block, complete: Secondary | ICD-10-CM | POA: Diagnosis not present

## 2015-12-28 LAB — COMPREHENSIVE METABOLIC PANEL WITH GFR
ALT: 9 U/L — ABNORMAL LOW (ref 14–54)
AST: 15 U/L (ref 15–41)
Albumin: 3.7 g/dL (ref 3.5–5.0)
Alkaline Phosphatase: 70 U/L (ref 38–126)
Anion gap: 15 (ref 5–15)
BUN: 16 mg/dL (ref 6–20)
CO2: 23 mmol/L (ref 22–32)
Calcium: 9.5 mg/dL (ref 8.9–10.3)
Chloride: 98 mmol/L — ABNORMAL LOW (ref 101–111)
Creatinine, Ser: 1.04 mg/dL — ABNORMAL HIGH (ref 0.44–1.00)
GFR calc Af Amer: 54 mL/min — ABNORMAL LOW
GFR calc non Af Amer: 46 mL/min — ABNORMAL LOW
Glucose, Bld: 135 mg/dL — ABNORMAL HIGH (ref 65–99)
Potassium: 4.2 mmol/L (ref 3.5–5.1)
Sodium: 136 mmol/L (ref 135–145)
Total Bilirubin: 0.8 mg/dL (ref 0.3–1.2)
Total Protein: 6.8 g/dL (ref 6.5–8.1)

## 2015-12-28 LAB — URINALYSIS, ROUTINE W REFLEX MICROSCOPIC
Bilirubin Urine: NEGATIVE
Glucose, UA: NEGATIVE mg/dL
Hgb urine dipstick: NEGATIVE
Ketones, ur: 40 mg/dL — AB
Leukocytes, UA: NEGATIVE
Nitrite: NEGATIVE
Protein, ur: 100 mg/dL — AB
Specific Gravity, Urine: 1.015 (ref 1.005–1.030)
pH: 6 (ref 5.0–8.0)

## 2015-12-28 LAB — CBC WITH DIFFERENTIAL/PLATELET
Basophils Absolute: 0 K/uL (ref 0.0–0.1)
Basophils Relative: 0 %
Eosinophils Absolute: 0 K/uL (ref 0.0–0.7)
Eosinophils Relative: 0 %
HCT: 44.7 % (ref 36.0–46.0)
Hemoglobin: 14.9 g/dL (ref 12.0–15.0)
Lymphocytes Relative: 12 %
Lymphs Abs: 0.6 K/uL — ABNORMAL LOW (ref 0.7–4.0)
MCH: 29.1 pg (ref 26.0–34.0)
MCHC: 33.3 g/dL (ref 30.0–36.0)
MCV: 87.3 fL (ref 78.0–100.0)
Monocytes Absolute: 0.2 K/uL (ref 0.1–1.0)
Monocytes Relative: 5 %
Neutro Abs: 4 K/uL (ref 1.7–7.7)
Neutrophils Relative %: 84 %
Platelets: 147 K/uL — ABNORMAL LOW (ref 150–400)
RBC: 5.12 MIL/uL — ABNORMAL HIGH (ref 3.87–5.11)
RDW: 13.3 % (ref 11.5–15.5)
WBC: 4.8 K/uL (ref 4.0–10.5)

## 2015-12-28 LAB — URINE MICROSCOPIC-ADD ON: WBC, UA: NONE SEEN WBC/hpf (ref 0–5)

## 2015-12-28 MED ORDER — SODIUM CHLORIDE 0.9 % IV BOLUS (SEPSIS)
500.0000 mL | Freq: Once | INTRAVENOUS | Status: AC
  Administered 2015-12-28: 500 mL via INTRAVENOUS

## 2015-12-28 MED ORDER — SODIUM CHLORIDE 0.9% FLUSH
3.0000 mL | Freq: Two times a day (BID) | INTRAVENOUS | Status: DC
  Administered 2015-12-28: 3 mL via INTRAVENOUS

## 2015-12-28 MED ORDER — ONDANSETRON HCL 4 MG PO TABS: 4.0000 mg | ORAL_TABLET | Freq: Four times a day (QID) | ORAL | Status: DC | PRN

## 2015-12-28 MED ORDER — ONDANSETRON HCL 4 MG/2ML IJ SOLN: 4.0000 mg | Freq: Four times a day (QID) | INTRAMUSCULAR | Status: DC | PRN

## 2015-12-28 MED ORDER — SODIUM CHLORIDE 0.9 % IV SOLN
INTRAVENOUS | Status: DC
  Administered 2015-12-28: 23:00:00 via INTRAVENOUS

## 2015-12-28 MED ORDER — ENOXAPARIN SODIUM 30 MG/0.3ML ~~LOC~~ SOLN
30.0000 mg | SUBCUTANEOUS | Status: DC
  Administered 2015-12-28: 30 mg via SUBCUTANEOUS
  Filled 2015-12-28: qty 0.3

## 2015-12-28 MED ORDER — ALBUTEROL SULFATE (2.5 MG/3ML) 0.083% IN NEBU: 2.5000 mg | INHALATION_SOLUTION | RESPIRATORY_TRACT | Status: DC | PRN

## 2015-12-28 MED ORDER — ACETAMINOPHEN 325 MG PO TABS: 650.0000 mg | ORAL_TABLET | Freq: Four times a day (QID) | ORAL | Status: DC | PRN

## 2015-12-28 MED ORDER — ACETAMINOPHEN 650 MG RE SUPP: 650.0000 mg | Freq: Four times a day (QID) | RECTAL | Status: DC | PRN

## 2015-12-28 NOTE — Progress Notes (Addendum)
Received patient from ED accompanied by family members.  Patient is unresponsive to verbal stimuli but responds to pain, VS stable with SBP in 90s-100s and DBP in upper 40s, O2Sat at 98% in RA and PR in upper 70s.  Admissons made aware of new admission. Family members left after the patient get situated and comfortably resting on bed.  Family members will be back to [facility] between 9AM-10AM.

## 2015-12-28 NOTE — H&P (Signed)
[facility] History and Physical  [name] [id number] DOB: 1924/07/29 DOA: 12/28/2015  Referring physician: ED PCP: Loura Pardon, MD   Chief Complaint: Failure to thrive  HPI: [name] is a 82 year old female with a past medical history significant for diabetes mellitus, HTN, diastolic CHF, s/p pacemaker, CAD, GERD, nephrolithiasis, dementia, CKD stage III; who presents from home via EMS due to increased lethargy with refusal to eat and drink. Patient was just hospitalized from 4/11 through 4/13 for having a possible syncopal episode. Family( son and daughter-in-law) provides history as the patient is unable to at this time. She had been able to at least ambulate and feed herself prior to coming into the hospital. However since she's been discharged they have not been able to get her to sip liquids through a straw or eat.  They do not want to put liquids in her mouth due to fear she may aspirate, but she will become dehydrated again. They stated that she has not been ambulatory and is sleeping most of the day. Also, observed that her blood pressure at home was low.  They had called concerning her refusal to eat and drink and increased lethargy and they were advised  to call EMS. They question if she needs to go into some rehabilitation versus possible need of palliative care. They note that the in-home health services were not able to come until sometime next week.  Upon admission patient was found to be lethargic  unable to open eyes or follow commands. Vitals were stable and lab work was similar to prior to discharge. Urinalysis and chest x-ray showed no acute signs of infection.   Review of Systems  Unable to perform ROS: acuity of condition    Past Medical History  Diagnosis Date  . Hypertension   . Gout   . Allergy     allergic rhinitis  . CAD (coronary artery disease)     pulmonary hypertension// Cardiac cath 2001//2004 carotid dopplers Normal  . CHF (congestive heart failure)  ([place])     non ish. cardiomyopathy// Dr. Harrington Challenger cardiologist  . GERD (gastroesophageal reflux disease) 2003    EGD polyp; gastritis HH//Dr. Carlean Purl [facility]  . Diverticula of intestine 2003    colonoscopy polyp; hem; divertic  . Hyperlipidemia     TRIG  . Osteopenia 2002    Dexa  . IBS (irritable bowel syndrome)   . Choledocholithiasis   . History of blood transfusion 1982    "related to bleeding stomach ulcers"  . Presence of permanent cardiac pacemaker   . Myocardial infarction [facility]) dx'd 2003    "evidence of one"  . Type II diabetes mellitus ([place])   . Pneumonia     "not since 1990s" (12/25/2015)  . History of bleeding peptic ulcer   . Daily headache     "lately" (12/25/2015)  . Osteoarthritis 07/12/2007/ 10/2004    spine and knees// LS Degen. changes   . Arthritis     "hands, back, right knee" (12/25/2015)  . History of gout   . Chronic kidney disease 05/2006    Renal failure ; hospitalized  . Kidney stones   . Renal cell cancer ([place]) 2002    Renal cell cancer partial nephrectomy  . Hx of recurrent urinary tract infection   . Dementia      Past Surgical History  Procedure Laterality Date  . Cataract extraction w/ intraocular lens implant Right 12/2006  . Cystoscopy w/ stone manipulation  2010- 2011    ERCPS and  clearing of choledocholithiasis  . Ercp  05/2009; 07/2009; 09/2009    "gallstones in her bile duct q time"  . Colonoscopy  09/2006  . Abdominal hysterectomy  1994  . Incontinence surgery  1994  . Cholecystectomy  1995  . Partial nephrectomy Right 05/2001    cancer  . Bi-ventricular implantable cardioverter defibrillator  (crt-d)  03/2002  . Pacemaker lead removal  09/2003; 05/2006  . Cataract extraction w/ intraocular lens implant Left 02/2013  . Colonoscopy w/ polypectomy  07/2002; 09/2006; 02/2009  . Insert / replace / remove pacemaker  06/2007; 06/2011    "no defibrillator"      Social History:  reports that she has never smoked. She has never used smokeless  tobacco. She reports that she does not drink alcohol or use illicit drugs. Where does patient live--home   with whom if at home? Can patient participate in ADLs? No  Allergies  Allergen Reactions  . Aspirin     REACTION: stomach upset  . Atorvastatin     REACTION: muscle pain  . Celecoxib     REACTION: stomach upset  . Fenofibrate     REACTION: muscle pain  . Metformin     REACTION: diarrhea  . Moxifloxacin     REACTION: stomach upset  . Niacin     REACTION: muscle pain    Family History  Problem Relation Age of Onset  . Cancer Daughter     breast cancer  . Cancer Son 41    Esophageal Cancer  . Heart disease Daughter        Prior to Admission medications   Medication Sig Start Date End Date Taking? Authorizing Provider  acetaminophen (TYLENOL) 325 MG tablet Take 650 mg by mouth every 6 (six) hours as needed for headache.    Yes Historical Provider, MD  amLODipine (NORVASC) 5 MG tablet Take 1 tablet (5 mg total) by mouth 2 (two) times daily. 07/17/15  Yes Deboraha Sprang, MD  carvedilol (COREG) 6.25 MG tablet Take 1 tablet (6.25 mg total) by mouth 2 (two) times daily with a meal. 12/27/15  Yes Lavina Hamman, MD  cephALEXin (KEFLEX) 500 MG capsule Take 1 capsule (500 mg total) by mouth 4 (four) times daily. 12/21/15  Yes Delman Kitten, MD  Cholecalciferol (VITAMIN D3) 2000 UNITS TABS Take 1 tablet by mouth daily.   Yes Historical Provider, MD  dicyclomine (BENTYL) 10 MG capsule take 1 capsule by mouth three times a day 10/10/15  Yes Abner Greenspan, MD  dorzolamide-timolol (COSOPT) 22.3-6.8 MG/ML ophthalmic solution Place 1 drop into the left eye 2 (two) times daily. As directed. 08/09/13  Yes Historical Provider, MD  esomeprazole (NEXIUM) 40 MG capsule Take 1 capsule (40 mg total) by mouth daily. 03/16/15  Yes Abner Greenspan, MD  gabapentin (NEURONTIN) 300 MG capsule Take 2 capsules (600 mg total) by mouth 2 (two) times daily. Patient taking differently: Take 300 mg by mouth 2 (two)  times daily.  03/16/15  Yes Coram, MD  latanoprost (XALATAN) 0.005 % ophthalmic solution Place 1 drop into the left eye at bedtime.    Yes Historical Provider, MD  loperamide (LOPERAMIDE A-D) 2 MG tablet Take 2 mg by mouth 3 (three) times daily as needed for diarrhea or loose stools. As needed   Yes Historical Provider, MD  ramipril (ALTACE) 2.5 MG capsule Take 1 capsule (2.5 mg total) by mouth daily. 12/27/15  Yes Lavina Hamman, MD  simvastatin (ZOCOR) 20 MG tablet  Take 1 tablet (20 mg total) by mouth daily. 03/16/15  Yes Abner Greenspan, MD  vitamin B-12 (CYANOCOBALAMIN) 1000 MCG tablet Take 1 tablet (1,000 mcg total) by mouth daily. 12/27/15  Yes Lavina Hamman, MD     Physical Exam: Filed Vitals:   12/28/15 1630 12/28/15 1715 12/28/15 1745 12/28/15 1815  BP: 141/61 104/50 104/54 120/44  Pulse: 91 92 91 90  Temp:      TempSrc:      Resp: 30 37 35 31  SpO2: 100% 97% 97% 95%     Constitutional: Vital signs reviewed. Patient is elderly female who is appears to be resting and will somewhat respond to verbal stimuli, but will not follow commands  Head: Normocephalic and atraumatic  Ear: TM normal bilaterally  Mouth: no erythema or exudates, dry mucous membranes  Eyes:  conjunctivae normal, No scleral icterus.  Neck: Supple, Trachea midline normal ROM, No JVD, mass, thyromegaly, or carotid bruit present.  Cardiovascular: RRR, S1 normal, S2 normal, no MRG, pulses symmetric and intact bilaterally  Pulmonary/Chest: Tachypneic with shallow breaths  Abdominal: Soft. Non-tender, non-distended, bowel sounds are normal, no masses, organomegaly, or guarding present.  GU: no CVA tenderness Musculoskeletal: No joint deformities, erythema, or stiffness, ROM full and no nontender Ext: no edema and no cyanosis, pulses palpable bilaterally (DP and PT)  Hematology: no cervical, inginal, or axillary adenopathy.  Neurological: Obtunded. able to move all extremities Skin: Warm, dry and intact. No rash,  cyanosis, or clubbing.  Psychiatric: unable to assess   Data Review   Micro Results No results found for this or any previous visit (from the past 240 hour(s)).  Radiology Reports Ct Head Wo Contrast  12/25/2015  CLINICAL DATA:  Syncope.  Headache. EXAM: CT HEAD WITHOUT CONTRAST TECHNIQUE: Contiguous axial images were obtained from the base of the skull through the vertex without intravenous contrast. COMPARISON:  Report of an exam of 06/04/2006.  Report not available. FINDINGS: Sinuses/Soft tissues: Hyperostosis frontalis interna. Multiple venous lakes within the calvarium. Clear paranasal sinuses and mastoid air cells. Intracranial: Expected cerebral volume loss for age. Moderate low density in the periventricular white matter likely related to small vessel disease. No mass lesion, hemorrhage, hydrocephalus, acute infarct, intra-axial, or extra-axial fluid collection. IMPRESSION: 1.  No acute intracranial abnormality. 2.  Cerebral atrophy and small vessel ischemic change. Electronically Signed   By: Abigail Miyamoto M.D.   On: 12/25/2015 13:38   Portable Chest 1 View  12/25/2015  CLINICAL DATA:  Syncope and weakness EXAM: PORTABLE CHEST 1 VIEW COMPARISON:  07/07/2011 FINDINGS: Cardiac shadow is within normal limits. Defibrillator is again noted and stable. Lungs are well aerated bilaterally. No acute bony abnormality is seen. IMPRESSION: No active disease. Electronically Signed   By: Inez Catalina M.D.   On: 12/25/2015 16:44   Dg Finger Little Left  12/21/2015  CLINICAL DATA:  Pain after fall EXAM: LEFT LITTLE FINGER 2+V COMPARISON:  None. FINDINGS: There is a comminuted displaced fracture of the fifth middle phalanx. No other acute abnormalities. IMPRESSION: Comminuted displaced fracture of the fifth middle phalanx Electronically Signed   By: Dorise Bullion III M.D   On: 12/21/2015 13:13     CBC  Recent Labs Lab 12/25/15 1219 12/25/15 1953 12/27/15 0351 12/28/15 1604  WBC 5.9  --  5.0 4.8   HGB 11.4*  --  11.1* 14.9  HCT 36.5 36.8 34.3* 44.7  PLT 132*  --  125* 147*  MCV 90.8  --  89.1 87.3  MCH 28.4  --  28.8 29.1  MCHC 31.2  --  32.4 33.3  RDW 13.4  --  13.5 13.3  LYMPHSABS  --   --  1.8 0.6*  MONOABS  --   --  0.4 0.2  EOSABS  --   --  0.1 0.0  BASOSABS  --   --  0.0 0.0    Chemistries   Recent Labs Lab 12/25/15 1219 12/27/15 0357 12/28/15 1604  NA 141 141 136  K 4.4 4.2 4.2  CL 103 106 98*  CO2 27 25 23   GLUCOSE 181* 119* 135*  BUN 20 16 16   CREATININE 1.04* 0.92 1.04*  CALCIUM 9.1 9.3 9.5  AST 19  --  15  ALT 11*  --  9*  ALKPHOS 61  --  70  BILITOT 0.6  --  0.8   ------------------------------------------------------------------------------------------------------------------ estimated creatinine clearance is 29 mL/min (by C-G formula based on Cr of 1.04). ------------------------------------------------------------------------------------------------------------------ No results for input(s): HGBA1C in the last 72 hours. ------------------------------------------------------------------------------------------------------------------ No results for input(s): CHOL, HDL, LDLCALC, TRIG, CHOLHDL, LDLDIRECT in the last 72 hours. ------------------------------------------------------------------------------------------------------------------ No results for input(s): TSH, T4TOTAL, T3FREE, THYROIDAB in the last 72 hours.  Invalid input(s): FREET3 ------------------------------------------------------------------------------------------------------------------  Recent Labs  12/26/15 0652  VITAMINB12 94*    Coagulation profile No results for input(s): INR, PROTIME in the last 168 hours.  No results for input(s): DDIMER in the last 72 hours.  Cardiac Enzymes  Recent Labs Lab 12/25/15 1932  TROPONINI <0.03   ------------------------------------------------------------------------------------------------------------------ Invalid input(s):  POCBNP   CBG:  Recent Labs Lab 12/26/15 1135 12/26/15 1606 12/26/15 2010 12/27/15 0740 12/27/15 1133  GLUCAP 110* 78 143* 103* 141*       EKG: Independently reviewed. Paced rhythm   Assessment/Plan Failure to thrive: Acute per family patient refusing to eat and drink - Admit to MedSurg bed - IV fluids of NS at 75 ml/hr - NPO until evaluated by speech therapy to eval and treat - Social work consult  - Palliative Care consult  Acute encephalopathy: No acute signs of infection on initial lab work UA negative - Check CT scan of the brain without contrast now for questionable right-sided facial droop and decreased responsiveness - Check ammonia level - May want to consult neurology for further investigation  Tachypnea : Patient with increased respiratory rate, but oxygenation remains stable. - Nasal cannula oxygen if needed with continuous pulse oximetry - Noted that this could be elevated due to patient age, but will check d-dimer - DuoNeb's when necessary shortness of breath or wheezing  Essential hypertension: Blood pressure was stable - Currently holding all oral medications  Diabetes mellitus type 2: We'll controlled last hemoglobin A1c was noted to be 6.3 on 4/11. - Hypoglycemic protocols  - CBGs every 4 hours for now    Acute kidney injury: Creatinine appears elevated back to when she initially presented during her last admission at 1.04, baseline creatinine 0.7-0.8 - Continue to monitor  Diastolic congestive heart failure: Stable last EF noted to be 45-55% in 12/2068  - Continue to monitor   Complete heart block status post pacemaker: Stable   Gerd  - IV Protonix  Lovenox for DVT prophylaxis  Code Status:   DO NOT RESUSCITATE Family Communication: bedside Disposition Plan: admit   Total time spent 55 minutes.Greater than 50% of this time was spent in counseling, explanation of diagnosis, planning of further management, and coordination of  care  [facility] Pager [phone]  If 7PM-7AM,  please contact night-coverage [email or web address] Password [facility] 12/28/2015, 8:17 PM

## 2015-12-28 NOTE — Care Management Note (Signed)
Case Management Note  Patient Details  Name: VIDYA BAMFORD MRN: [id number] Date of Birth: Apr 07, 1924  Subjective/Objective:     Patient presented to [facility] ED with c/o anorexia and generalized weakness, patient was discharged from [facility] yesterday after Obs admission.                 Action/Plan: CM met with patient's daughter Tyla Burgner [phone]  and son Seba Madole [phone] HCPOA regarding goals of care. Daughter reports prior to hospitalization patient was somewhat independent, family would like to see patient return to baseline. Family inquiring about placement for Rehab. Discussed with family that placement can be arranged from home, more [facility] services can be added to assist with reconditiong and to assist with the placement process from home. Family stated that patient was discharged with [facility] who cannot start care until next Wednesday 4/19. Family concerned patient has ceased oral intake.  ED evaluation still in process. CM will continue to follow up for discharge plan.   Expected Discharge Date:                  Expected Discharge Plan:  [place]  In-House Referral:     Discharge planning Services  CM Consult  Post Acute Care Choice:    Choice offered to:  Adult Children  DME Arranged:    DME Agency:     HH Arranged:  RN, PT, OT, Nurse's Aide, Social Work [employer] Agency:  [place]  Status of Service:  Completed, signed off  Medicare Important Message Given:    Date Medicare IM Given:    Medicare IM give by:    Date Additional Medicare IM Given:    Additional Medicare Important Message give by:     If discussed at [place] of Stay Meetings, dates discussed:    Additional CommentsLaurena Slimmer, RN 12/28/2015, 7:24 PM

## 2015-12-28 NOTE — ED Notes (Signed)
Pt. Coming from home via [facility] for weakness, altered mental status, and lack of appetite. Pt. Released from hospital yesterday for admission for the same. Pt. Hx of dementia. Family at bedside and reports this is not her baseline cognitively and she won't drink or eat.

## 2015-12-28 NOTE — ED Notes (Signed)
Family at bedside, patient is comfortable.

## 2015-12-28 NOTE — ED Provider Notes (Signed)
CSN: [id number]     Arrival date & time 12/28/15  1453 History   First MD Initiated Contact with Patient 12/28/15 1500     Chief Complaint  Patient presents with  . Weakness     (Consider location/radiation/quality/duration/timing/severity/associated sxs/prior Treatment) HPI   [name] is an 82 year old female with history of CAD, CHF, pacemaker placement, dementia, DM who was brought to the ED by daughter for weakness. Level V caveat, dementia. Patient was discharged from hospital yesterday after an observational stay for similar symptoms. Daughter is concerned as patient is unable to eat, swallow or walk. Patient is also difficult to arouse and is not responding to verbal stimuli. Daughter is unsure of how to care for her at this point. Daughter and son are requesting nursing facility placement. No reported fevers, chills, loss of consciousness.    Past Medical History  Diagnosis Date  . Hypertension   . Gout   . Allergy     allergic rhinitis  . CAD (coronary artery disease)     pulmonary hypertension// Cardiac cath 2001//2004 carotid dopplers Normal  . CHF (congestive heart failure) ([place])     non ish. cardiomyopathy// Dr. Harrington Challenger cardiologist  . GERD (gastroesophageal reflux disease) 2003    EGD polyp; gastritis HH//Dr. Carlean Purl GI  . Diverticula of intestine 2003    colonoscopy polyp; hem; divertic  . Hyperlipidemia     TRIG  . Osteopenia 2002    Dexa  . IBS (irritable bowel syndrome)   . Choledocholithiasis   . History of blood transfusion 1982    "related to bleeding stomach ulcers"  . Presence of permanent cardiac pacemaker   . Myocardial infarction [facility]) dx'd 2003    "evidence of one"  . Type II diabetes mellitus ([place])   . Pneumonia     "not since 1990s" (12/25/2015)  . History of bleeding peptic ulcer   . Daily headache     "lately" (12/25/2015)  . Osteoarthritis 07/12/2007/ 10/2004    spine and knees// LS Degen. changes   . Arthritis     "hands, back, right  knee" (12/25/2015)  . History of gout   . Chronic kidney disease 05/2006    Renal failure ; hospitalized  . Kidney stones   . Renal cell cancer ([place]) 2002    Renal cell cancer partial nephrectomy  . Hx of recurrent urinary tract infection   . Dementia    Past Surgical History  Procedure Laterality Date  . Cataract extraction w/ intraocular lens implant Right 12/2006  . Cystoscopy w/ stone manipulation  2010- 2011    ERCPS and clearing of choledocholithiasis  . Ercp  05/2009; 07/2009; 09/2009    "gallstones in her bile duct q time"  . Colonoscopy  09/2006  . Abdominal hysterectomy  1994  . Incontinence surgery  1994  . Cholecystectomy  1995  . Partial nephrectomy Right 05/2001    cancer  . Bi-ventricular implantable cardioverter defibrillator  (crt-d)  03/2002  . Pacemaker lead removal  09/2003; 05/2006  . Cataract extraction w/ intraocular lens implant Left 02/2013  . Colonoscopy w/ polypectomy  07/2002; 09/2006; 02/2009  . Insert / replace / remove pacemaker  06/2007; 06/2011    "no defibrillator"   Family History  Problem Relation Age of Onset  . Cancer Daughter     breast cancer  . Cancer Son 54    Esophageal Cancer  . Heart disease Daughter    Social History  Substance Use Topics  . Smoking status: Never  Smoker   . Smokeless tobacco: Never Used  . Alcohol Use: No   OB History    No data available     Review of Systems  All other systems reviewed and are negative.     Allergies  Aspirin; Atorvastatin; Celecoxib; Fenofibrate; Metformin; Moxifloxacin; and Niacin  Home Medications   Prior to Admission medications   Medication Sig Start Date End Date Taking? Authorizing Provider  acetaminophen (TYLENOL) 325 MG tablet Take 650 mg by mouth every 6 (six) hours as needed for headache.    Yes Historical Provider, MD  amLODipine (NORVASC) 5 MG tablet Take 1 tablet (5 mg total) by mouth 2 (two) times daily. 07/17/15  Yes Deboraha Sprang, MD  carvedilol (COREG) 6.25 MG tablet  Take 1 tablet (6.25 mg total) by mouth 2 (two) times daily with a meal. 12/27/15  Yes Lavina Hamman, MD  cephALEXin (KEFLEX) 500 MG capsule Take 1 capsule (500 mg total) by mouth 4 (four) times daily. 12/21/15  Yes Delman Kitten, MD  Cholecalciferol (VITAMIN D3) 2000 UNITS TABS Take 1 tablet by mouth daily.   Yes Historical Provider, MD  dicyclomine (BENTYL) 10 MG capsule take 1 capsule by mouth three times a day 10/10/15  Yes Abner Greenspan, MD  dorzolamide-timolol (COSOPT) 22.3-6.8 MG/ML ophthalmic solution Place 1 drop into the left eye 2 (two) times daily. As directed. 08/09/13  Yes Historical Provider, MD  esomeprazole (NEXIUM) 40 MG capsule Take 1 capsule (40 mg total) by mouth daily. 03/16/15  Yes Abner Greenspan, MD  gabapentin (NEURONTIN) 300 MG capsule Take 2 capsules (600 mg total) by mouth 2 (two) times daily. Patient taking differently: Take 300 mg by mouth 2 (two) times daily.  03/16/15  Yes Amada Acres, MD  latanoprost (XALATAN) 0.005 % ophthalmic solution Place 1 drop into the left eye at bedtime.    Yes Historical Provider, MD  loperamide (LOPERAMIDE A-D) 2 MG tablet Take 2 mg by mouth 3 (three) times daily as needed for diarrhea or loose stools. As needed   Yes Historical Provider, MD  ramipril (ALTACE) 2.5 MG capsule Take 1 capsule (2.5 mg total) by mouth daily. 12/27/15  Yes Lavina Hamman, MD  simvastatin (ZOCOR) 20 MG tablet Take 1 tablet (20 mg total) by mouth daily. 03/16/15  Yes Abner Greenspan, MD  vitamin B-12 (CYANOCOBALAMIN) 1000 MCG tablet Take 1 tablet (1,000 mcg total) by mouth daily. 12/27/15  Yes Lavina Hamman, MD   BP 160/70 mmHg  Pulse 89  Temp(Src) 97.6 F (36.4 C) (Oral)  Resp 32  SpO2 100% Physical Exam  Constitutional: She is oriented to person, place, and time. No distress.  Frail, ill-appearing woman  HENT:  Head: Normocephalic and atraumatic.  Mouth/Throat: No oropharyngeal exudate.  Eyes: Conjunctivae and EOM are normal. Pupils are equal, round, and reactive to  light. Right eye exhibits no discharge. Left eye exhibits no discharge. No scleral icterus.  Cardiovascular: Normal rate, regular rhythm, normal heart sounds and intact distal pulses.  Exam reveals no gallop and no friction rub.   No murmur heard. Pulmonary/Chest: Effort normal and breath sounds normal. No respiratory distress. She has no wheezes. She has no rales. She exhibits no tenderness.  Abdominal: Soft. She exhibits no distension. There is no tenderness. There is no guarding.  Musculoskeletal: Normal range of motion. She exhibits no edema.  Neurological: She is alert and oriented to person, place, and time.  Non-responsive to verbal stimuli. Responsive to pain. Pt does  not spontaneously open eyes. Cannot perform strength or sensory exam.   Skin: Skin is warm and dry. No rash noted. She is not diaphoretic. No erythema. No pallor.  Psychiatric: She has a normal mood and affect. Her behavior is normal.  Nursing note and vitals reviewed.   ED Course  Procedures (including critical care time) Labs Review Labs Reviewed  COMPREHENSIVE METABOLIC PANEL - Abnormal; Notable for the following:    Chloride 98 (*)    Glucose, Bld 135 (*)    Creatinine, Ser 1.04 (*)    ALT 9 (*)    GFR calc non Af Amer 46 (*)    GFR calc Af Amer 54 (*)    All other components within normal limits  CBC WITH DIFFERENTIAL/PLATELET - Abnormal; Notable for the following:    RBC 5.12 (*)    Platelets 147 (*)    Lymphs Abs 0.6 (*)    All other components within normal limits  URINALYSIS, ROUTINE W REFLEX MICROSCOPIC (NOT AT [facility]) - Abnormal; Notable for the following:    Ketones, ur 40 (*)    Protein, ur 100 (*)    All other components within normal limits  URINE MICROSCOPIC-ADD ON - Abnormal; Notable for the following:    Squamous Epithelial / LPF 0-5 (*)    Bacteria, UA RARE (*)    All other components within normal limits    Imaging Review No results found. I have personally reviewed and evaluated these  images and lab results as part of my medical decision-making.   EKG Interpretation   Date/Time:  Friday December 28 2015 15:02:56 EDT Ventricular Rate:  83 PR Interval:  221 QRS Duration: 134 QT Interval:  427 QTC Calculation: 502 R Axis:   -118 Text Interpretation:  Atrial-sensed ventricular-paced rhythm No further  analysis attempted due to paced rhythm Confirmed by KNOTT MD, Quillian Quince  [id number]) on 12/28/2015 3:39:13 PM      MDM   Final diagnoses:  Failure to thrive in adult    82 y.o F with multiple comorbidities, dementia who presents to the ED with increased weakness. Patient was admitted to the hospital 2 days ago and discharged yesterday with similar symptoms. She had complete workup while in the hospital with all negative results. Patient's family has brought her back to the hospital as they're unable to get her to eat, walk or take her medication. Family is unsure how to care for her at this point. On exam patient is not responsive to verbal stimuli but is possible to painful stimuli. All of her vitals are stable. All blood work, EKG and UA within normal limits. Patient received 1 L IV fluids while in the ER. Case management has seen the patient and discussed with their family options of skilled nursing facility placement. However, as it is the weekend they will not be able to get her placed until next week. Spoke with hospitalist about option of re-admitting due to failure to thrive. As family is not comfortable taking the patient home, hospitalist will likely admit for failure to thrive with a pallet of care consult. Case management went to see the patient and family again to discuss hospice care. Patient will likely be brought to the hospital for observation overnight with hospice care upon discharge tomorrow.   Dondra Spry [place], PA-C 12/28/15 2040  Leo Grosser, MD 12/29/15 [phone]

## 2015-12-29 DIAGNOSIS — R627 Adult failure to thrive: Principal | ICD-10-CM

## 2015-12-29 DIAGNOSIS — Z515 Encounter for palliative care: Secondary | ICD-10-CM | POA: Insufficient documentation

## 2015-12-29 DIAGNOSIS — G9340 Encephalopathy, unspecified: Secondary | ICD-10-CM | POA: Diagnosis present

## 2015-12-29 LAB — CBC
HEMATOCRIT: 32.8 % — AB (ref 36.0–46.0)
HEMOGLOBIN: 10.9 g/dL — AB (ref 12.0–15.0)
MCH: 29 pg (ref 26.0–34.0)
MCHC: 33.2 g/dL (ref 30.0–36.0)
MCV: 87.2 fL (ref 78.0–100.0)
PLATELETS: 120 10*3/uL — AB (ref 150–400)
RBC: 3.76 MIL/uL — AB (ref 3.87–5.11)
RDW: 13.6 % (ref 11.5–15.5)
WBC: 10.4 10*3/uL (ref 4.0–10.5)

## 2015-12-29 LAB — GLUCOSE, CAPILLARY
GLUCOSE-CAPILLARY: 111 mg/dL — AB (ref 65–99)
Glucose-Capillary: 120 mg/dL — ABNORMAL HIGH (ref 65–99)

## 2015-12-29 LAB — COMPREHENSIVE METABOLIC PANEL
ALT: 9 U/L — ABNORMAL LOW (ref 14–54)
ANION GAP: 12 (ref 5–15)
AST: 14 U/L — ABNORMAL LOW (ref 15–41)
Albumin: 2.6 g/dL — ABNORMAL LOW (ref 3.5–5.0)
Alkaline Phosphatase: 45 U/L (ref 38–126)
BUN: 24 mg/dL — ABNORMAL HIGH (ref 6–20)
CHLORIDE: 105 mmol/L (ref 101–111)
CO2: 23 mmol/L (ref 22–32)
CREATININE: 1.6 mg/dL — AB (ref 0.44–1.00)
Calcium: 8.5 mg/dL — ABNORMAL LOW (ref 8.9–10.3)
GFR calc Af Amer: 32 mL/min — ABNORMAL LOW (ref >60)
GFR, EST NON AFRICAN AMERICAN: 27 mL/min — AB (ref >60)
Glucose, Bld: 130 mg/dL — ABNORMAL HIGH (ref 65–99)
POTASSIUM: 3.7 mmol/L (ref 3.5–5.1)
Sodium: 140 mmol/L (ref 135–145)
Total Bilirubin: 1 mg/dL (ref 0.3–1.2)
Total Protein: 5.1 g/dL — ABNORMAL LOW (ref 6.5–8.1)

## 2015-12-29 LAB — D-DIMER, QUANTITATIVE: D-Dimer, Quant: 4.97 ug/mL-FEU — ABNORMAL HIGH (ref 0.00–0.50)

## 2015-12-29 LAB — AMMONIA: Ammonia: 15 umol/L (ref 9–35)

## 2015-12-29 MED ORDER — PANTOPRAZOLE SODIUM 40 MG IV SOLR
40.0000 mg | Freq: Every day | INTRAVENOUS | Status: DC
Start: 2015-12-29 — End: 2015-12-30
  Administered 2015-12-29: 40 mg via INTRAVENOUS
  Filled 2015-12-29: qty 40

## 2015-12-29 MED ORDER — INSULIN ASPART 100 UNIT/ML ~~LOC~~ SOLN: 0.0000 [IU] | SUBCUTANEOUS | Status: DC

## 2015-12-29 MED ORDER — CETYLPYRIDINIUM CHLORIDE 0.05 % MT LIQD
7.0000 mL | Freq: Two times a day (BID) | OROMUCOSAL | Status: DC
  Administered 2015-12-29 – 2015-12-30 (×2): 7 mL via OROMUCOSAL

## 2015-12-29 NOTE — Care Management (Addendum)
CM spoke with [facility] and was Informed that equipment delivery may be  backed up possibly until Monday. Family made aware follow-up with [facility], equipment and services can be established today, as per [employer] at [place] [phone].Marland Kitchen Equipment can be delivered and set up today Family was updated, they are agreeable with the discharge plan, and will transport patient home.  Updated Dr. Wendee Beavers on discharge plan. No further questions or concerns verbalized. No further CM needs identified.

## 2015-12-29 NOTE — Evaluation (Addendum)
Clinical/Bedside Swallow Evaluation Patient Details  Name: [name] MRN: [id number] Date of Birth: 06-25-1924  Today's Date: 12/29/2015 Time: SLP Start Time (ACUTE ONLY): [id number] SLP Stop Time (ACUTE ONLY): 0836 SLP Time Calculation (min) (ACUTE ONLY): 15 min  Past Medical History:  Past Medical History  Diagnosis Date  . Hypertension   . Gout   . Allergy     allergic rhinitis  . CAD (coronary artery disease)     pulmonary hypertension// Cardiac cath 2001//2004 carotid dopplers Normal  . CHF (congestive heart failure) ([place])     non ish. cardiomyopathy// Dr. Harrington Challenger cardiologist  . GERD (gastroesophageal reflux disease) 2003    EGD polyp; gastritis HH//Dr. Carlean Purl GI  . Diverticula of intestine 2003    colonoscopy polyp; hem; divertic  . Hyperlipidemia     TRIG  . Osteopenia 2002    Dexa  . IBS (irritable bowel syndrome)   . Choledocholithiasis   . History of blood transfusion 1982    "related to bleeding stomach ulcers"  . Presence of permanent cardiac pacemaker   . Myocardial infarction [facility]) dx'd 2003    "evidence of one"  . Type II diabetes mellitus ([place])   . Pneumonia     "not since 1990s" (12/25/2015)  . History of bleeding peptic ulcer   . Daily headache     "lately" (12/25/2015)  . Osteoarthritis 07/12/2007/ 10/2004    spine and knees// LS Degen. changes   . Arthritis     "hands, back, right knee" (12/25/2015)  . History of gout   . Chronic kidney disease 05/2006    Renal failure ; hospitalized  . Kidney stones   . Renal cell cancer ([place]) 2002    Renal cell cancer partial nephrectomy  . Hx of recurrent urinary tract infection   . Dementia    Past Surgical History:  Past Surgical History  Procedure Laterality Date  . Cataract extraction w/ intraocular lens implant Right 12/2006  . Cystoscopy w/ stone manipulation  2010- 2011    ERCPS and clearing of choledocholithiasis  . Ercp  05/2009; 07/2009; 09/2009    "gallstones in her bile duct q time"  . Colonoscopy   09/2006  . Abdominal hysterectomy  1994  . Incontinence surgery  1994  . Cholecystectomy  1995  . Partial nephrectomy Right 05/2001    cancer  . Bi-ventricular implantable cardioverter defibrillator  (crt-d)  03/2002  . Pacemaker lead removal  09/2003; 05/2006  . Cataract extraction w/ intraocular lens implant Left 02/2013  . Colonoscopy w/ polypectomy  07/2002; 09/2006; 02/2009  . Insert / replace / remove pacemaker  06/2007; 06/2011    "no defibrillator"   HPI:  [name] is a 82 year old female with a past medical history significant for diabetes mellitus, HTN, diastolic CHF, s/p pacemaker, CAD, GERD, nephrolithiasis, dementia, CKD stage III admitted from home due to increased lethargy with refusal to eat and drink. Per chart recent hospitalization 4/11-4/13 for having a possible syncopal episode. Urinalysis and chest x-ray showed no acute signs of infection. CT no acute process. BSE 12/27/15 revealed WFL swallow function. Limited mastication (endentuous) however family preferred to continue regular texture and order appropriate foods fo her.    Assessment / Plan / Recommendation Clinical Impression  No indications of pharyngeal dysfunction with consistencies assessed. Pt is endentulous although small piece cracker attempted; pt unable to manipulate and removed via SLP. Per BSE 12/27/15, pt's family preferred a regular texture diet for flexibility and to order softer foods. Family is  not present this am to order soft consistency, therefore SLP will order softl diet and RN can change to regular if family desires (discussed plan with RN).     Aspiration Risk   (mild-mod)    Diet Recommendation Thin liquid (soft)   Liquid Administration via: Cup;Straw Medication Administration: Whole meds with liquid Supervision: Patient able to self feed;Full supervision/cueing for compensatory strategies Compensations: Slow rate;Small sips/bites;Minimize environmental distractions Postural Changes: Seated upright  at 90 degrees    Other  Recommendations Oral Care Recommendations: Oral care BID   Follow up Recommendations  24 hour supervision/assistance    Frequency and Duration            Prognosis        Swallow Study   General HPI: [name] is a 82 year old female with a past medical history significant for diabetes mellitus, HTN, diastolic CHF, s/p pacemaker, CAD, GERD, nephrolithiasis, dementia, CKD stage III admitted from home due to increased lethargy with refusal to eat and drink. Per chart recent hospitalization 4/11-4/13 for having a possible syncopal episode. Urinalysis and chest x-ray showed no acute signs of infection. CT no acute process. BSE 12/27/15 revealed WFL swallow function. Limited mastication (endentuous) however family preferred to continue regular texture and order appropriate foods fo her.  Type of Study: Bedside Swallow Evaluation Previous Swallow Assessment:  (see history) Diet Prior to this Study: NPO Temperature Spikes Noted: No Respiratory Status: Room air History of Recent Intubation: No Behavior/Cognition: Alert;Cooperative;Pleasant mood Oral Cavity Assessment: Dry Oral Care Completed by SLP: No Oral Cavity - Dentition: Edentulous Self-Feeding Abilities: Needs set up;Needs assist Patient Positioning: Upright in bed Baseline Vocal Quality: Normal Volitional Cough: Weak Volitional Swallow: Unable to elicit    Oral/Motor/Sensory Function Overall Oral Motor/Sensory Function:  (decreased coordination)   Ice Chips Ice chips: Not tested   Thin Liquid Thin Liquid: Within functional limits Presentation: Cup;Straw    Nectar Thick Nectar Thick Liquid: Not tested   Honey Thick Honey Thick Liquid: Not tested   Puree Puree: Within functional limits   Solid   GO   Solid: Impaired Oral Phase Impairments: Poor awareness of bolus Oral Phase Functional Implications:  (pt unable to manipulate) Pharyngeal Phase Impairments:  (SLP removed)    Functional Assessment  Tool Used: skilled clinical judgment Functional Limitations: Swallowing Swallow Current Status [id number]): At least 1 percent but less than 20 percent impaired, limited or restricted Swallow Goal Status [phone]): At least 1 percent but less than 20 percent impaired, limited or restricted Swallow Discharge Status [phone]): At least 1 percent but less than 20 percent impaired, limited or restricted   Houston Siren 12/29/2015,8:50 AM  Orbie Pyo Colvin Caroli.Ed [employer] [phone]

## 2015-12-29 NOTE — Progress Notes (Signed)
Due to delay in equipment delivery to home for pt and need for ambulance transport, pt will be discharged to home tomorrow.  Family updated. Hospice nurse called me and she was updated.  She also spoke with the family so once the patient leaves with the ambulance tomorrow, Hospice is to be called and notified that the pt is on the way.

## 2015-12-29 NOTE — Progress Notes (Signed)
Called [facility] to arrange transport home via ambulance. Spoke with pt daughter who takes care of her mom and she states hospital bed is supposed to be delivered around 6 pm.

## 2015-12-29 NOTE — Progress Notes (Signed)
Family here and amazed at how patient is alert and talking this morning.  Palliative consult this morning.

## 2015-12-29 NOTE — Care Management (Addendum)
Patient was seen by Palliative Care goals of care have been established with patient and family.  Family has decided on Home Hospice services . CM spoke with son Neelie Men offered choice, explained to family that this is a holiday weekend there may be limitations arranging services. Referral was called and faxed to [facility] [place].Updated family teach back done, verbalized understanding. Liaison from [facility] will meet with patient and family. CM will follow up for discharge to home hospice.

## 2015-12-29 NOTE — Consult Note (Signed)
Consultation Note Date: 12/29/2015   Patient Name: [name]  DOB: March 04, 1924  MRN: [id number]  Age / Sex: 82 y.o., female  PCP: Judy Pimple, MD Referring Physician: Penny Pia, MD  Reason for Consultation: Establishing goals of care, Hospice Evaluation and Psychosocial/spiritual support    Clinical Assessment/Narrative: Patient is an 82 year female with a past medical history for diabetes, hypertension, diastolic heart failure, pacemaker, coronary artery disease, GERD, chronic kidney disease stage III and moderate to severe dementia. She is admitted from home via EMS secondary to increased lethargy, no by mouth intake. Patient was just hospitalized from 411 through 413 after having a possible syncopal episode. Patient has made a complete 180 change since coming into the hospital last night. She is now alert. She is pleasantly confused. She has multiple family members at her bedside this morning. She is talking nonstop to them. She shares with me that she prayed to God to give her one more chance to talk to her family and that God has granted her her wish. I met with her daughter [name] and she has lived her entire life, her sons and daughter-in-law's. We discussed at length the pathophysiology related to dementia and with end-of-life may look like secondary to this disease progression.  Contacts/Participants in Discussion: Primary Decision Maker: family makes decisions as a whole, but son [name] is primary spokesperosn  Relationship to Patient son HCPOA: yes  She lives with her daughter [name], but multiple family members live nearby  SUMMARY OF RECOMMENDATIONS Patient to return home with hospice care in the home Consult placed to care management in order to facilitate this transition Patient will need a hospital bed before she can return home Patient will need to have hospice meet the family relatively quickly  from discharge to avoid rehospitalization for continued support  Code Status/Advance Care Planning: DNR    Code Status Orders        Start     Ordered   12/28/15 2145  Do not attempt resuscitation (DNR)   Continuous    Question Answer Comment  In the event of cardiac or respiratory ARREST Do not call a "code blue"   In the event of cardiac or respiratory ARREST Do not perform Intubation, CPR, defibrillation or ACLS   In the event of cardiac or respiratory ARREST Use medication by any route, position, wound care, and other measures to relive pain and suffering. May use oxygen, suction and manual treatment of airway obstruction as needed for comfort.      12/28/15 2144    Code Status History    Date Active Date Inactive Code Status Order ID Comments User Context   12/25/2015  3:18 PM 12/27/2015  8:19 PM DNR [id number]  Gwenyth Bender, NP ED    Advance Directive Documentation        Most Recent Value   Type of Advance Directive  Living will, Healthcare Power of Attorney   Pre-existing out of facility DNR order (yellow form or pink MOST form)     "MOST" Form in Place?        Other Directives:None  Symptom Management:   Pain: Patient is denying pain currently. Her daughter [name] states she has had minimal pain over the past couple of months; continue with Tylenol when necessary. We'll defer to hospice agency as condition declines for further recommendations  Palliative Prophylaxis:   Aspiration, Bowel Regimen, Delirium Protocol, Frequent Pain Assessment and Turn Reposition  Additional Recommendations (Limitations, Scope, Preferences):  Full  Comfort Care  Psycho-social/Spiritual:  Support System: Strong Desire for further Chaplaincy support:no Additional Recommendations: Grief/Bereavement Support  Prognosis: < 2 weeks  Discharge Planning: Home with Hospice   Chief Complaint/ Primary Diagnoses: Present on Admission:  . Failure to thrive in adult . Essential  hypertension . Museum/gallery curator . GERD (gastroesophageal reflux disease) . Acute encephalopathy  I have reviewed the medical record, interviewed the patient and family, and examined the patient. The following aspects are pertinent.  Past Medical History  Diagnosis Date  . Hypertension   . Gout   . Allergy     allergic rhinitis  . CAD (coronary artery disease)     pulmonary hypertension// Cardiac cath 2001//2004 carotid dopplers Normal  . CHF (congestive heart failure) ([place])     non ish. cardiomyopathy// Dr. Harrington Challenger cardiologist  . GERD (gastroesophageal reflux disease) 2003    EGD polyp; gastritis HH//Dr. Carlean Purl [facility]  . Diverticula of intestine 2003    colonoscopy polyp; hem; divertic  . Hyperlipidemia     TRIG  . Osteopenia 2002    Dexa  . IBS (irritable bowel syndrome)   . Choledocholithiasis   . History of blood transfusion 1982    "related to bleeding stomach ulcers"  . Presence of permanent cardiac pacemaker   . Myocardial infarction [facility]) dx'd 2003    "evidence of one"  . Type II diabetes mellitus ([place])   . Pneumonia     "not since 1990s" (12/25/2015)  . History of bleeding peptic ulcer   . Daily headache     "lately" (12/25/2015)  . Osteoarthritis 07/12/2007/ 10/2004    spine and knees// LS Degen. changes   . Arthritis     "hands, back, right knee" (12/25/2015)  . History of gout   . Chronic kidney disease 05/2006    Renal failure ; hospitalized  . Kidney stones   . Renal cell cancer ([place]) 2002    Renal cell cancer partial nephrectomy  . Hx of recurrent urinary tract infection   . Dementia    Social History   Social History  . Marital Status: Widowed    Spouse Name: N/A  . Number of Children: N/A  . Years of Education: N/A   Social History Main Topics  . Smoking status: Never Smoker   . Smokeless tobacco: Never Used  . Alcohol Use: No  . Drug Use: No  . Sexual Activity: No   Other Topics Concern  . None   Social History Narrative    Family History  Problem Relation Age of Onset  . Cancer Daughter     breast cancer  . Cancer Son 23    Esophageal Cancer  . Heart disease Daughter    Scheduled Meds: . antiseptic oral rinse  7 mL Mouth Rinse BID  . enoxaparin (LOVENOX) injection  30 mg Subcutaneous Q24H  . insulin aspart  0-9 Units Subcutaneous 6 times per day  . pantoprazole (PROTONIX) IV  40 mg Intravenous Daily  . sodium chloride flush  3 mL Intravenous Q12H   Continuous Infusions: . sodium chloride 75 mL/hr at 12/28/15 2318   PRN Meds:.acetaminophen **OR** acetaminophen, albuterol, ondansetron **OR** ondansetron (ZOFRAN) IV Medications Prior to Admission:  Prior to Admission medications   Medication Sig Start Date End Date Taking? Authorizing Provider  acetaminophen (TYLENOL) 325 MG tablet Take 650 mg by mouth every 6 (six) hours as needed for headache.    Yes Historical Provider, MD  amLODipine (NORVASC) 5 MG tablet Take 1 tablet (5 mg total)  by mouth 2 (two) times daily. 07/17/15  Yes Deboraha Sprang, MD  carvedilol (COREG) 6.25 MG tablet Take 1 tablet (6.25 mg total) by mouth 2 (two) times daily with a meal. 12/27/15  Yes Lavina Hamman, MD  cephALEXin (KEFLEX) 500 MG capsule Take 1 capsule (500 mg total) by mouth 4 (four) times daily. 12/21/15  Yes Delman Kitten, MD  Cholecalciferol (VITAMIN D3) 2000 UNITS TABS Take 1 tablet by mouth daily.   Yes Historical Provider, MD  dicyclomine (BENTYL) 10 MG capsule take 1 capsule by mouth three times a day 10/10/15  Yes Abner Greenspan, MD  dorzolamide-timolol (COSOPT) 22.3-6.8 MG/ML ophthalmic solution Place 1 drop into the left eye 2 (two) times daily. As directed. 08/09/13  Yes Historical Provider, MD  esomeprazole (NEXIUM) 40 MG capsule Take 1 capsule (40 mg total) by mouth daily. 03/16/15  Yes Abner Greenspan, MD  gabapentin (NEURONTIN) 300 MG capsule Take 2 capsules (600 mg total) by mouth 2 (two) times daily. Patient taking differently: Take 300 mg by mouth 2 (two) times  daily.  03/16/15  Yes [place], MD  latanoprost (XALATAN) 0.005 % ophthalmic solution Place 1 drop into the left eye at bedtime.    Yes Historical Provider, MD  loperamide (LOPERAMIDE A-D) 2 MG tablet Take 2 mg by mouth 3 (three) times daily as needed for diarrhea or loose stools. As needed   Yes Historical Provider, MD  ramipril (ALTACE) 2.5 MG capsule Take 1 capsule (2.5 mg total) by mouth daily. 12/27/15  Yes Lavina Hamman, MD  simvastatin (ZOCOR) 20 MG tablet Take 1 tablet (20 mg total) by mouth daily. 03/16/15  Yes Abner Greenspan, MD  vitamin B-12 (CYANOCOBALAMIN) 1000 MCG tablet Take 1 tablet (1,000 mcg total) by mouth daily. 12/27/15  Yes Lavina Hamman, MD   Allergies  Allergen Reactions  . Aspirin     REACTION: stomach upset  . Atorvastatin     REACTION: muscle pain  . Celecoxib     REACTION: stomach upset  . Fenofibrate     REACTION: muscle pain  . Metformin     REACTION: diarrhea  . Moxifloxacin     REACTION: stomach upset  . Niacin     REACTION: muscle pain    Review of Systems  Unable to perform ROS: Dementia    Physical Exam  Constitutional:  Frail, elderly female. Alert, pleasantly confused  HENT:  Head: Normocephalic and atraumatic.  Respiratory: Effort normal.  Neurological: She is alert.  confused  Skin: Skin is warm and dry.    Vital Signs: BP 117/52 mmHg  Pulse 65  Temp(Src) 97.8 F (36.6 C) (Axillary)  Resp 21  SpO2 97%  SpO2: SpO2: 97 % O2 Device:SpO2: 97 % O2 Flow Rate: .   IO: Intake/output summary:  Intake/Output Summary (Last 24 hours) at 12/29/15 1154 Last data filed at 12/29/15 1017  Gross per 24 hour  Intake  567.5 ml  Output      0 ml  Net  567.5 ml    LBM:   Baseline Weight:   Most recent weight:        Palliative Assessment/Data:  Flowsheet Rows        Most Recent Value   Intake Tab    Referral Department  Hospitalist   Unit at Time of Referral  Med/Surg Unit   Palliative Care Primary Diagnosis  Other (Comment)    Date Notified  12/28/15   Palliative Care Type  New  Palliative care   Reason for referral  Clarify Goals of Care, Counsel Regarding Hospice   Date of Admission  12/28/15   Date first seen by Palliative Care  12/29/15   # of days Palliative referral response time  1 Day(s)   # of days IP prior to Palliative referral  0   Clinical Assessment    Palliative Performance Scale Score  30%   Pain Max last 24 hours  Not able to report   Pain Min Last 24 hours  Not able to report   Dyspnea Max Last 24 Hours  Not able to report   Dyspnea Min Last 24 hours  Not able to report   Nausea Max Last 24 Hours  Not able to report   Nausea Min Last 24 Hours  Not able to report   Anxiety Max Last 24 Hours  Not able to report   Anxiety Min Last 24 Hours  Not able to report   Other Max Last 24 Hours  Not able to report   Psychosocial & Spiritual Assessment    Palliative Care Outcomes    Palliative Care Outcomes  Clarified goals of care, Counseled regarding hospice, Transitioned to hospice   Patient/Family wishes: Interventions discontinued/not started   Mechanical Ventilation, NIPPV, Trach, BiPAP, Hemodialysis, PEG, Transfusion, Vasopressors, Transfer out of ICU, Antibiotics, Tube feedings/TPN      Additional Data Reviewed:  CBC:    Component Value Date/Time   WBC 10.4 12/29/2015 0241   HGB 10.9* 12/29/2015 0241   HCT 32.8* 12/29/2015 0241   HCT 36.8 12/25/2015 1953   PLT 120* 12/29/2015 0241   MCV 87.2 12/29/2015 0241   NEUTROABS 4.0 12/28/2015 1604   LYMPHSABS 0.6* 12/28/2015 1604   MONOABS 0.2 12/28/2015 1604   EOSABS 0.0 12/28/2015 1604   BASOSABS 0.0 12/28/2015 1604   Comprehensive Metabolic Panel:    Component Value Date/Time   NA 140 12/29/2015 0241   K 3.7 12/29/2015 0241   CL 105 12/29/2015 0241   CO2 23 12/29/2015 0241   BUN 24* 12/29/2015 0241   CREATININE 1.60* 12/29/2015 0241   CREATININE 0.71 08/13/2015 1040   GLUCOSE 130* 12/29/2015 0241   CALCIUM 8.5* 12/29/2015 0241    AST 14* 12/29/2015 0241   ALT 9* 12/29/2015 0241   ALKPHOS 45 12/29/2015 0241   BILITOT 1.0 12/29/2015 0241   PROT 5.1* 12/29/2015 0241   ALBUMIN 2.6* 12/29/2015 0241     Time In: 0900 Time Out: 1030 Time Total: 90 min Greater than 50%  of this time was spent counseling and coordinating care related to the above assessment and plan.  Signed by: Dory Horn, NP  Dory Horn, NP  12/29/2015, 11:54 AM  Please contact Palliative Medicine Team phone at [phone] for questions and concerns.

## 2015-12-29 NOTE — Discharge Summary (Addendum)
Physician Discharge Summary  [name] WCB:[id number] DOB: 08-31-1924 DOA: 12/28/2015  PCP: Loura Pardon, MD  Admit date: 12/28/2015 Discharge date: 12/29/2015  Time spent: > 35  minutes  Recommendations for Outpatient Follow-up:  1. Continue to ensure comfort care at home   Discharge Diagnoses:  Principal Problem:   Failure to thrive in adult Active Problems:   Diabetes ([place])   Essential hypertension   GERD (gastroesophageal reflux disease)   Pacemaker-[employer]   Acute encephalopathy   Palliative care encounter   Discharge Condition: Stable  Diet recommendation: As tolerated  There were no vitals filed for this visit.  History of present illness:  82 year old Caucasian female with past medical history significant for dementia who presents to the hospital with increased lethargy and failure to thrive. Patient has been refusing oral intake.  Hospital Course:  FTT -Most likely secondary to worsening dementia. Plan is for comfort care measures at home. - Family met with palliative ankles of care established - Case manager assisting in obtaining equipment to continue to ensure comfort care measures once patient discharges from hospital  Procedures:  None  Consultations:  Palliative care  Discharge Exam: Filed Vitals:   12/29/15 0214 12/29/15 0443  BP: 98/40 117/52  Pulse:  65  Temp:  97.8 F (36.6 C)  Resp: 24 21    General: Patient in no acute distress, alert and awake Cardiovascular: No cyanosis Respiratory: No increased work of breathing, no wheezes  Discharge Instructions   Discharge Instructions    Call MD for:  severe uncontrolled pain    Complete by:  As directed      Call MD for:  temperature >100.4    Complete by:  As directed      Diet - low sodium heart healthy    Complete by:  As directed      Discharge instructions    Complete by:  As directed   Plan will be to discharge home with home hospice.     Increase activity slowly     Complete by:  As directed           Current Discharge Medication List    CONTINUE these medications which have NOT CHANGED   Details  acetaminophen (TYLENOL) 325 MG tablet Take 650 mg by mouth every 6 (six) hours as needed for headache.     cephALEXin (KEFLEX) 500 MG capsule Take 1 capsule (500 mg total) by mouth 4 (four) times daily. Qty: 40 capsule, Refills: 0    dorzolamide-timolol (COSOPT) 22.3-6.8 MG/ML ophthalmic solution Place 1 drop into the left eye 2 (two) times daily. As directed.    latanoprost (XALATAN) 0.005 % ophthalmic solution Place 1 drop into the left eye at bedtime.     loperamide (LOPERAMIDE A-D) 2 MG tablet Take 2 mg by mouth 3 (three) times daily as needed for diarrhea or loose stools. As needed      STOP taking these medications     amLODipine (NORVASC) 5 MG tablet      carvedilol (COREG) 6.25 MG tablet      Cholecalciferol (VITAMIN D3) 2000 UNITS TABS      dicyclomine (BENTYL) 10 MG capsule      esomeprazole (NEXIUM) 40 MG capsule      gabapentin (NEURONTIN) 300 MG capsule      ramipril (ALTACE) 2.5 MG capsule      simvastatin (ZOCOR) 20 MG tablet      vitamin B-12 (CYANOCOBALAMIN) 1000 MCG tablet  Allergies  Allergen Reactions  . Aspirin     REACTION: stomach upset  . Atorvastatin     REACTION: muscle pain  . Celecoxib     REACTION: stomach upset  . Fenofibrate     REACTION: muscle pain  . Metformin     REACTION: diarrhea  . Moxifloxacin     REACTION: stomach upset  . Niacin     REACTION: muscle pain      The results of significant diagnostics from this hospitalization (including imaging, microbiology, ancillary and laboratory) are listed below for reference.    Significant Diagnostic Studies: Ct Head Wo Contrast  12/28/2015  CLINICAL DATA:  Weakness. Discharged yesterday from hospital for similar symptoms. Unable to eat, or walk. Poorly responsive. History dementia, pacemaker placement, CHF. EXAM: CT HEAD WITHOUT  CONTRAST TECHNIQUE: Contiguous axial images were obtained from the base of the skull through the vertex without intravenous contrast. COMPARISON:  CT head December 25, 2015 FINDINGS: INTRACRANIAL CONTENTS: The ventricles and sulci are normal for age. No intraparenchymal hemorrhage, mass effect nor midline shift. Patchy supratentorial white matter hypodensities are within normal range for patient's age and though non-specific likely represent chronic small vessel ischemic disease. No acute large vascular territory infarcts. No abnormal extra-axial fluid collections. Basal cisterns are patent. Moderate calcific atherosclerosis of the carotid siphons. ORBITS: The included ocular globes and orbital contents are non-suspicious. Status post bilateral ocular lens implants. SINUSES: The mastoid aircells and included paranasal sinuses are well-aerated. SKULL/SOFT TISSUES: No skull fracture. No significant soft tissue swelling. Osteopenia. IMPRESSION: No acute intracranial process. Stable involutional changes and moderate chronic small vessel ischemic disease. Electronically Signed   By: Elon Alas M.D.   On: 12/28/2015 23:33   Ct Head Wo Contrast  12/25/2015  CLINICAL DATA:  Syncope.  Headache. EXAM: CT HEAD WITHOUT CONTRAST TECHNIQUE: Contiguous axial images were obtained from the base of the skull through the vertex without intravenous contrast. COMPARISON:  Report of an exam of 06/04/2006.  Report not available. FINDINGS: Sinuses/Soft tissues: Hyperostosis frontalis interna. Multiple venous lakes within the calvarium. Clear paranasal sinuses and mastoid air cells. Intracranial: Expected cerebral volume loss for age. Moderate low density in the periventricular white matter likely related to small vessel disease. No mass lesion, hemorrhage, hydrocephalus, acute infarct, intra-axial, or extra-axial fluid collection. IMPRESSION: 1.  No acute intracranial abnormality. 2.  Cerebral atrophy and small vessel ischemic  change. Electronically Signed   By: Abigail Miyamoto M.D.   On: 12/25/2015 13:38   Portable Chest 1 View  12/28/2015  CLINICAL DATA:  [place] yet arrived.  Encephalopathy. EXAM: PORTABLE CHEST 1 VIEW COMPARISON:  12/25/2015 FINDINGS: There are intact appearances of the transvenous cardiac leads. There is mild unchanged right hemidiaphragm elevation. The lungs are clear. There is no large effusion. The pulmonary vasculature is normal. IMPRESSION: No acute cardiopulmonary findings. Electronically Signed   By: Andreas Newport M.D.   On: 12/28/2015 22:29   Portable Chest 1 View  12/25/2015  CLINICAL DATA:  Syncope and weakness EXAM: PORTABLE CHEST 1 VIEW COMPARISON:  07/07/2011 FINDINGS: Cardiac shadow is within normal limits. Defibrillator is again noted and stable. Lungs are well aerated bilaterally. No acute bony abnormality is seen. IMPRESSION: No active disease. Electronically Signed   By: Inez Catalina M.D.   On: 12/25/2015 16:44   Dg Finger Little Left  12/21/2015  CLINICAL DATA:  Pain after fall EXAM: LEFT LITTLE FINGER 2+V COMPARISON:  None. FINDINGS: There is a comminuted displaced fracture of the fifth middle  phalanx. No other acute abnormalities. IMPRESSION: Comminuted displaced fracture of the fifth middle phalanx Electronically Signed   By: Dorise Bullion III M.D   On: 12/21/2015 13:13    Microbiology: No results found for this or any previous visit (from the past 240 hour(s)).   Labs: Basic Metabolic Panel:  Recent Labs Lab 12/25/15 1219 12/27/15 0357 12/28/15 1604 12/29/15 0241  NA 141 141 136 140  K 4.4 4.2 4.2 3.7  CL 103 106 98* 105  CO2 '27 25 23 23  '$ GLUCOSE 181* 119* 135* 130*  BUN '20 16 16 '$ 24*  CREATININE 1.04* 0.92 1.04* 1.60*  CALCIUM 9.1 9.3 9.5 8.5*  PHOS  --  3.1  --   --    Liver Function Tests:  Recent Labs Lab 12/25/15 1219 12/27/15 0357 12/28/15 1604 12/29/15 0241  AST 19  --  15 14*  ALT 11*  --  9* 9*  ALKPHOS 61  --  70 45  BILITOT 0.6  --  0.8 1.0   PROT 6.0*  --  6.8 5.1*  ALBUMIN 3.5 3.2* 3.7 2.6*   No results for input(s): LIPASE, AMYLASE in the last 168 hours.  Recent Labs Lab 12/29/15 0241  AMMONIA 15   CBC:  Recent Labs Lab 12/25/15 1219 12/25/15 1953 12/27/15 0351 12/28/15 1604 12/29/15 0241  WBC 5.9  --  5.0 4.8 10.4  NEUTROABS  --   --  2.7 4.0  --   HGB 11.4*  --  11.1* 14.9 10.9*  HCT 36.5 36.8 34.3* 44.7 32.8*  MCV 90.8  --  89.1 87.3 87.2  PLT 132*  --  125* 147* 120*   Cardiac Enzymes:  Recent Labs Lab 12/25/15 1932  TROPONINI <0.03   BNP: BNP (last 3 results) No results for input(s): BNP in the last 8760 hours.  ProBNP (last 3 results) No results for input(s): PROBNP in the last 8760 hours.  CBG:  Recent Labs Lab 12/26/15 2010 12/27/15 0740 12/27/15 1133 12/29/15 0441 12/29/15 0806  GLUCAP 143* 103* 141* 120* 111*       Signed:  Velvet Bathe MD.  [facility] 12/29/2015, 4:00 PM    Chart reviewed and VSS. Will proceed with plan for transition home with comfort care measures provided by home hospice group.  Neill Jurewicz, [employer]

## 2015-12-30 DIAGNOSIS — R531 Weakness: Secondary | ICD-10-CM | POA: Diagnosis not present

## 2015-12-30 DIAGNOSIS — F0390 Unspecified dementia without behavioral disturbance: Secondary | ICD-10-CM | POA: Diagnosis not present

## 2015-12-30 NOTE — Care Management (Signed)
Despite RN note concerning equipment delay this CM spoke [facility] Mariann Laster, and equipment arrived as scheduled but transport of patient could not move forward as gold DNR form not signed by MD.

## 2015-12-31 ENCOUNTER — Telehealth: Payer: Self-pay

## 2015-12-31 DIAGNOSIS — Z515 Encounter for palliative care: Secondary | ICD-10-CM

## 2015-12-31 DIAGNOSIS — R627 Adult failure to thrive: Secondary | ICD-10-CM

## 2015-12-31 DIAGNOSIS — G3183 Dementia with Lewy bodies: Secondary | ICD-10-CM

## 2015-12-31 DIAGNOSIS — F0281 Dementia in other diseases classified elsewhere with behavioral disturbance: Secondary | ICD-10-CM

## 2015-12-31 NOTE — Telephone Encounter (Signed)
Per chart review tab pt was seen 12/28/15 at [facility] ED; pt was admitted on 12/28/15 and discharged on 12/29/15.

## 2015-12-31 NOTE — Telephone Encounter (Signed)
PLEASE NOTE: All timestamps contained within this report are represented as [place] Standard Time. CONFIDENTIALTY NOTICE: This fax transmission is intended only for the addressee. It contains information that is legally privileged, confidential or otherwise protected from use or disclosure. If you are not the intended recipient, you are strictly prohibited from reviewing, disclosing, copying using or disseminating any of this information or taking any action in reliance on or regarding this information. If you have received this fax in error, please notify us immediately by telephone so that we can arrange for its return to [place]. Phone: [phone], Toll-Free: [phone], Fax: [phone] Page: 1 of 1 Call Id: [id number] [place] Night - Client Nonclinical Telephone Record [place] Night - Client Client Site [facility], [place] Contact Type Call Call [place] Page Now Who Is [place] / [place] Name [place] Name [facility] [place] Number [phone] Patient Name [name] Patient DOB Sep 10, 1924 Reason for Call Request to speak to Physician Initial Comment Caller states Mariann Laster w/[facility], [phone], Dalyla Goans, 10-07-1923; pt is at [facility] and needs orders to admit to [facility]. They are discharging today; Additional Comment Paging DoctorName Phone DateTime Result/Outcome Message Type Notes Alysia Penna [id number] 12/29/2015 2:40:54 PM Paged On Call to Other Provider Doctor Paged Please call Mariann Laster with [facility] at [phone]. Needs orders to admit hospice services. Alysia Penna 12/29/2015 2:41:00 PM Paged On Call to Another Provider Message Result Call Closed By: Sherilyn Dacosta Transaction Date/Time:  12/29/2015 2:31:25 PM (ET)

## 2015-12-31 NOTE — Telephone Encounter (Signed)
I will put a hospice order in - let me know /cancel it if it is already done  I will forward to [facility]

## 2015-12-31 NOTE — Telephone Encounter (Signed)
I think home hospice was employed for this upon discharge - it sounds like they had agreed to comfort care If I am correct  If they did not go back to the hospital -please check in re: her hospice status - ? Are they going to come out?

## 2015-12-31 NOTE — Telephone Encounter (Signed)
PLEASE NOTE: All timestamps contained within this report are represented as [place] Standard Time. CONFIDENTIALTY NOTICE: This fax transmission is intended only for the addressee. It contains information that is legally privileged, confidential or otherwise protected from use or disclosure. If you are not the intended recipient, you are strictly prohibited from reviewing, disclosing, copying using or disseminating any of this information or taking any action in reliance on or regarding this information. If you have received this fax in error, please notify us immediately by telephone so that we can arrange for its return to [place]. Phone: [phone], Toll-Free: [phone], Fax: [phone] Page: 1 of 2 Call Id: [id number] [place] Patient Name: [name] Gender: Female DOB: 1923/10/08 Age: 82 Y 11 M 9 D Return Phone Number: [phone] (Primary) Address: City/State/Zip: [place] Client [place] [facility] - Client Client Site [facility] [place] Type Call Who Is Calling Patient / Member / Family / Caregiver Call Type Triage / Clinical Caller Name Floreen Comber Relationship To Patient Daughter Return Phone Number [phone] (Primary) Chief Complaint CONFUSION - new onset Reason for Call Symptomatic / Request for Health Information Initial Comment caller states her mother was d/c from the hospital yesterday - she is refusing to swallow her medication - is refusing to eat or drink - was in the hospital for dehydration- they need to know what to do PreDisposition Go to ED Translation No Nurse Assessment Nurse: Vallery Sa, RN, [place] Date/Time (Eastern Time): 12/28/2015 12:56:38 PM Confirm and document reason for call. If symptomatic, describe symptoms. You must click the next button to save text entered. ---Caller states [name]  was discharged from the hospital yesterday (treated for dehydration) and she doesn't want to take food, fluid or her medications. She has passed urine within the past 12 hours. She is more confused than usual today. No severe breathing difficulty. No fever. No known pain. Has the patient traveled out of the country within the last 30 days? ---Not Applicable Does the patient have any new or worsening symptoms? ---Yes Will a triage be completed? ---Yes Related visit to physician within the last 2 weeks? ---Yes Does the PT have any chronic conditions? (i.e. diabetes, asthma, etc.) ---Yes List chronic conditions. ---Dementia, Pacemaker, Recent Dehydration and UTI Is this a behavioral health or substance abuse call? ---No Guidelines Guideline Title Affirmed Question Affirmed Notes Nurse Date/Time (Eastern Time) Confusion - Delirium [1] Difficult to awaken or acting confused (disoriented, slurred [place], RN, [place] 12/28/2015 1:02:48 PM PLEASE NOTE: All timestamps contained within this report are represented as [place] Standard Time. CONFIDENTIALTY NOTICE: This fax transmission is intended only for the addressee. It contains information that is legally privileged, confidential or otherwise protected from use or disclosure. If you are not the intended recipient, you are strictly prohibited from reviewing, disclosing, copying using or disseminating any of this information or taking any action in reliance on or regarding this information. If you have received this fax in error, please notify us immediately by telephone so that we can arrange for its return to [place]. Phone: [phone], Toll-Free: [phone], Fax: [phone] Page: 2 of 2 Call Id: [id number] Guidelines Guideline Title Affirmed Question Affirmed Notes Nurse Date/Time Eilene Ghazi Time) speech) AND [2] present now AND [3] new onset Disp. Time Eilene Ghazi Time) Disposition Final User 12/28/2015 1:06:47 PM Send To RN Personal Vallery Sa,  RN, [facility] 12/28/2015 1:16:49 PM 911 Outcome Documentation [place],  RN, [place] Reason: Floreen Comber states she reached 911. 12/28/2015 1:19:10 PM Call Completed Matilde Bash 12/28/2015 1:04:47 PM Call EMS 911 Now Yes [place], RN, [place]y [place] Understands: Yes Disagree/Comply: Comply Care Advice Given Per Guideline CALL EMS 911 NOW: Immediate medical attention is needed. You need to hang up and call 911 (or an ambulance). ([facility] Discretion: I'll call you back in a few minutes to be sure you were able to reach them.) CARE ADVICE given per Confusion- Delirium (Adult) guideline.

## 2016-01-01 NOTE — Telephone Encounter (Signed)
[place], they faxed form over for Dr Glori Bickers to sign. Dr Glori Bickers signed form and we have faxed it back to [place].

## 2016-01-01 NOTE — Telephone Encounter (Signed)
[name] has taken care of hospice referral, they have already came out and eval pt

## 2016-01-03 LAB — CULTURE, BLOOD (ROUTINE X 2)
CULTURE: NO GROWTH
Culture: NO GROWTH

## 2016-01-09 DIAGNOSIS — G9340 Encephalopathy, unspecified: Secondary | ICD-10-CM | POA: Diagnosis not present

## 2016-01-09 DIAGNOSIS — G3183 Dementia with Lewy bodies: Secondary | ICD-10-CM | POA: Diagnosis not present

## 2016-01-09 DIAGNOSIS — R627 Adult failure to thrive: Secondary | ICD-10-CM | POA: Diagnosis not present

## 2016-01-09 DIAGNOSIS — F0281 Dementia in other diseases classified elsewhere with behavioral disturbance: Secondary | ICD-10-CM | POA: Diagnosis not present

## 2016-01-16 ENCOUNTER — Ambulatory Visit (INDEPENDENT_AMBULATORY_CARE_PROVIDER_SITE_OTHER): Admitting: *Deleted

## 2016-01-16 DIAGNOSIS — I442 Atrioventricular block, complete: Secondary | ICD-10-CM

## 2016-01-17 NOTE — Progress Notes (Signed)
Remote pacemaker transmission.   

## 2016-01-29 ENCOUNTER — Ambulatory Visit: Payer: Medicare Other | Admitting: Sports Medicine

## 2016-02-22 ENCOUNTER — Encounter: Payer: Self-pay | Admitting: Cardiology

## 2016-02-26 LAB — CUP PACEART REMOTE DEVICE CHECK
Battery Remaining Percentage: 35 %
Date Time Interrogation Session: 20170503084100
Implantable Lead Implant Date: 20030715
Implantable Lead Implant Date: 20050112
Implantable Lead Location: 753858
Implantable Lead Model: 158
Implantable Lead Model: 4518
Implantable Lead Serial Number: 308576
Lead Channel Impedance Value: 560 Ohm
Lead Channel Pacing Threshold Pulse Width: 1 ms
Lead Channel Setting Pacing Amplitude: 4.5 V
Lead Channel Setting Pacing Pulse Width: 0.4 ms
MDC IDC LEAD IMPLANT DT: 20030715
MDC IDC LEAD LOCATION: 753859
MDC IDC LEAD LOCATION: 753860
MDC IDC LEAD SERIAL: 115184
MDC IDC MSMT BATTERY REMAINING LONGEVITY: 24 mo
MDC IDC MSMT LEADCHNL LV IMPEDANCE VALUE: 655 Ohm
MDC IDC MSMT LEADCHNL LV PACING THRESHOLD AMPLITUDE: 3 V
MDC IDC MSMT LEADCHNL RA IMPEDANCE VALUE: 432 Ohm
MDC IDC MSMT LEADCHNL RA PACING THRESHOLD AMPLITUDE: 0.6 V
MDC IDC MSMT LEADCHNL RA PACING THRESHOLD PULSEWIDTH: 0.4 ms
MDC IDC PG SERIAL: 100410
MDC IDC SET LEADCHNL LV PACING PULSEWIDTH: 1 ms
MDC IDC SET LEADCHNL LV SENSING SENSITIVITY: 2.5 mV
MDC IDC SET LEADCHNL RA PACING AMPLITUDE: 2 V
MDC IDC SET LEADCHNL RV PACING AMPLITUDE: 2.4 V
MDC IDC SET LEADCHNL RV SENSING SENSITIVITY: 2.5 mV
MDC IDC STAT BRADY RA PERCENT PACED: 86 %
MDC IDC STAT BRADY RV PERCENT PACED: 99 %

## 2016-04-09 DIAGNOSIS — G3183 Dementia with Lewy bodies: Secondary | ICD-10-CM | POA: Diagnosis not present

## 2016-04-09 DIAGNOSIS — I130 Hypertensive heart and chronic kidney disease with heart failure and stage 1 through stage 4 chronic kidney disease, or unspecified chronic kidney disease: Secondary | ICD-10-CM | POA: Diagnosis not present

## 2016-04-09 DIAGNOSIS — F0281 Dementia in other diseases classified elsewhere with behavioral disturbance: Secondary | ICD-10-CM | POA: Diagnosis not present

## 2016-04-09 DIAGNOSIS — R627 Adult failure to thrive: Secondary | ICD-10-CM | POA: Diagnosis not present

## 2016-04-16 ENCOUNTER — Ambulatory Visit (INDEPENDENT_AMBULATORY_CARE_PROVIDER_SITE_OTHER): Admitting: *Deleted

## 2016-04-16 ENCOUNTER — Telehealth: Payer: Self-pay | Admitting: Internal Medicine

## 2016-04-16 DIAGNOSIS — I442 Atrioventricular block, complete: Secondary | ICD-10-CM | POA: Diagnosis not present

## 2016-04-16 NOTE — Progress Notes (Signed)
Remote pacemaker transmission.   

## 2016-04-16 NOTE — Telephone Encounter (Signed)
Spoke w/ pt daughter and informed her that we did receive pt remote transmission. Pt daughter verbalized understanding.  

## 2016-04-16 NOTE — Telephone Encounter (Signed)
New Message   1. Has your device fired? No  2. Is you device beeping? No  3. Are you experiencing draining or swelling at device site? No  4. Are you calling to see if we received your device transmission? No, it's not sending it's yellow and when trying to send to Dr there isn't a light.  5. Have you passed out? No  Pts daughter verbalized the device hasn't done it before.

## 2016-04-23 ENCOUNTER — Encounter: Payer: Self-pay | Admitting: Cardiology

## 2016-05-06 LAB — CUP PACEART REMOTE DEVICE CHECK
Battery Remaining Percentage: 32 %
Brady Statistic RA Percent Paced: 81 %
Brady Statistic RV Percent Paced: 99 %
Implantable Lead Implant Date: 20030715
Implantable Lead Implant Date: 20030715
Implantable Lead Implant Date: 20050112
Implantable Lead Location: 753859
Implantable Lead Model: 158
Implantable Lead Model: 5076
Implantable Lead Serial Number: 115184
Lead Channel Impedance Value: 420 Ohm
Lead Channel Impedance Value: 611 Ohm
Lead Channel Pacing Threshold Amplitude: 0.6 V
Lead Channel Pacing Threshold Pulse Width: 1 ms
Lead Channel Setting Pacing Amplitude: 2.4 V
Lead Channel Setting Pacing Amplitude: 4.5 V
Lead Channel Setting Sensing Sensitivity: 2.5 mV
MDC IDC LEAD LOCATION: 753858
MDC IDC LEAD LOCATION: 753860
MDC IDC LEAD MODEL: 4518
MDC IDC LEAD SERIAL: 308576
MDC IDC MSMT BATTERY REMAINING LONGEVITY: 24 mo
MDC IDC MSMT LEADCHNL LV PACING THRESHOLD AMPLITUDE: 3 V
MDC IDC MSMT LEADCHNL RA PACING THRESHOLD PULSEWIDTH: 0.4 ms
MDC IDC MSMT LEADCHNL RV IMPEDANCE VALUE: 556 Ohm
MDC IDC PG SERIAL: 100410
MDC IDC SESS DTM: 20170802084100
MDC IDC SET LEADCHNL LV PACING PULSEWIDTH: 1 ms
MDC IDC SET LEADCHNL RA PACING AMPLITUDE: 2 V
MDC IDC SET LEADCHNL RV PACING PULSEWIDTH: 0.4 ms
MDC IDC SET LEADCHNL RV SENSING SENSITIVITY: 2.5 mV

## 2016-06-03 ENCOUNTER — Telehealth: Payer: Self-pay | Admitting: Internal Medicine

## 2016-06-03 NOTE — Telephone Encounter (Signed)
new message   Pt daughter verbalized that she is now under hospice care

## 2016-06-04 NOTE — Telephone Encounter (Signed)
Will forward to Dr. Klein to review. 

## 2016-08-03 IMAGING — DX DG FINGER LITTLE 2+V*L*
3 series · 3 of 3 positions shown · non-contrast
Comparison: None.

CLINICAL DATA: Pain after fall

EXAM:
LEFT LITTLE FINGER 2+V

[finger ap]
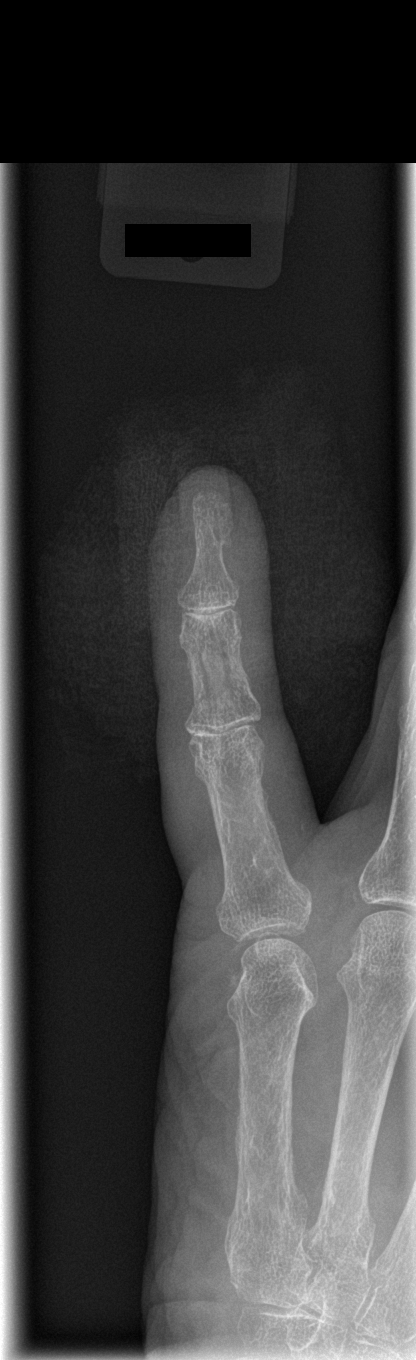

[finger obl]
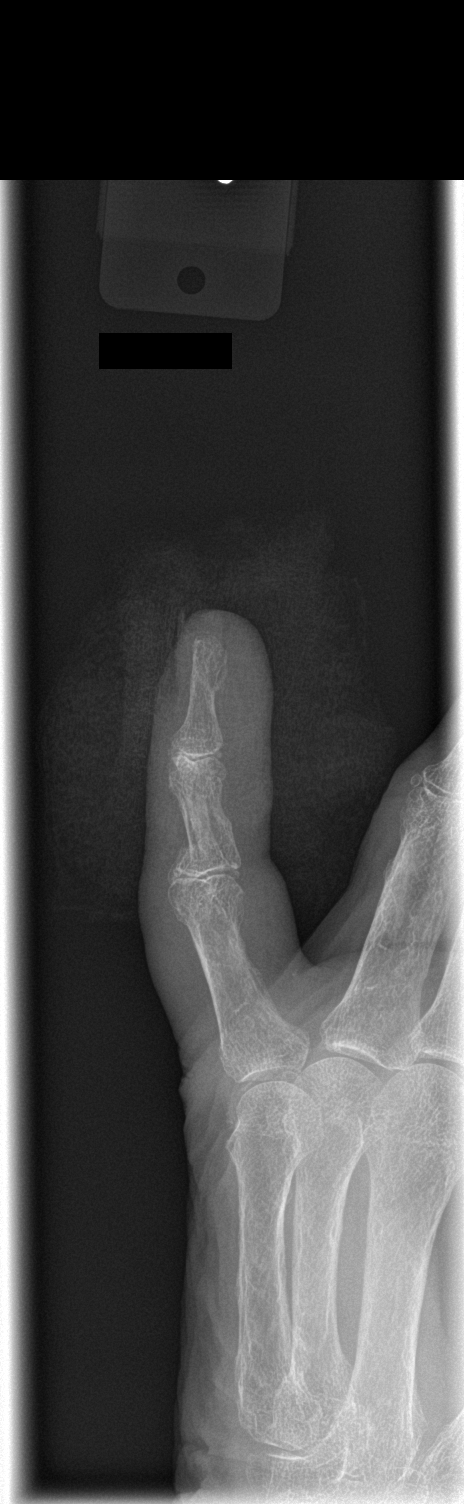

[finger lat]
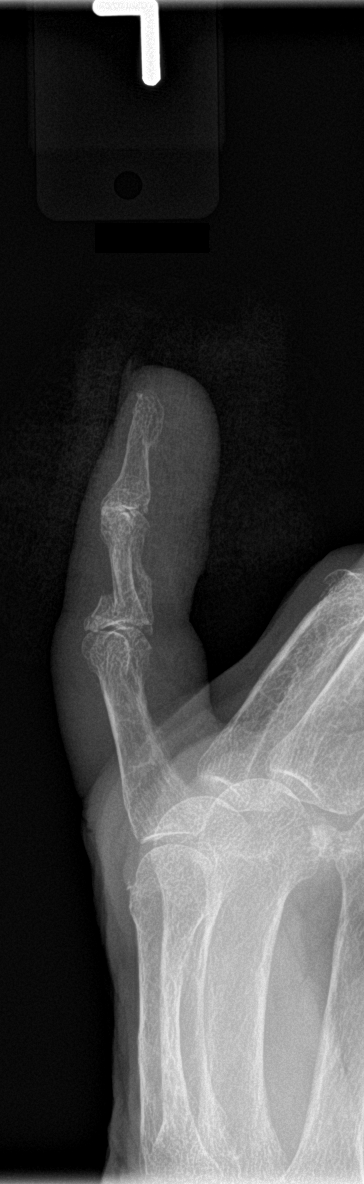

[3 of 3 positions shown; findings below may reference images not displayed]

FINDINGS: There is a comminuted displaced fracture of the fifth middle
phalanx. No other acute abnormalities.
IMPRESSION: Comminuted displaced fracture of the fifth middle phalanx

## 2016-08-10 IMAGING — CT CT HEAD W/O CM
2 series · 15 of 30 positions shown, 17 images · non-contrast
Comparison: CT head December 25, 2015

CLINICAL DATA: Weakness. Discharged yesterday from hospital for
similar symptoms. Unable to eat, or walk. Poorly responsive. History
dementia, pacemaker placement, CHF.

EXAM:
CT HEAD WITHOUT CONTRAST
TECHNIQUE: Contiguous axial images were obtained from the base of the skull
through the vertex without intravenous contrast.

[Series 2: head without · axial · non-contrast · 0.43mm/px · z∈[-114,+1]mm · 7 of 31 slices shown, 9 images]
[im 4/31  brain]
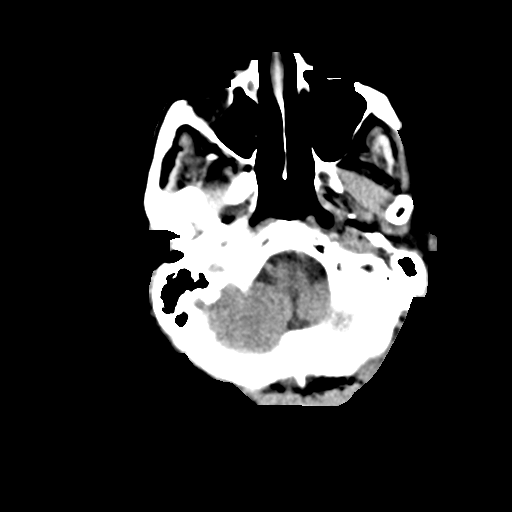
[im 4/31  bone]
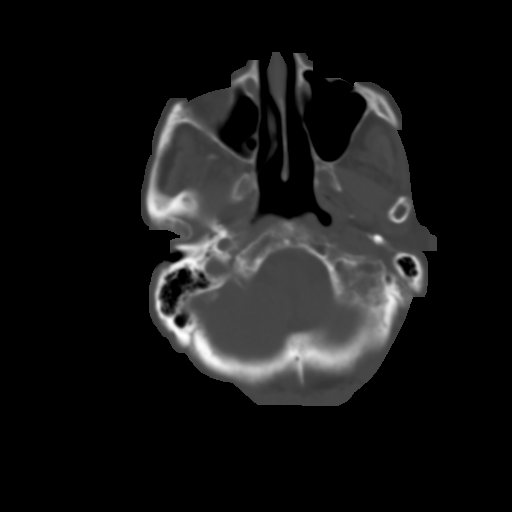
[im 8/31  brain]
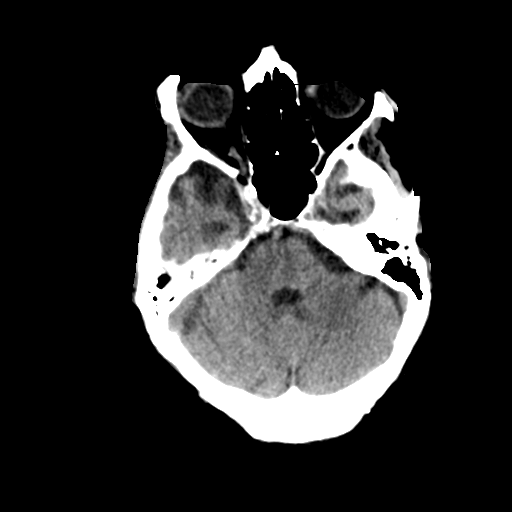
[im 12/31  brain]
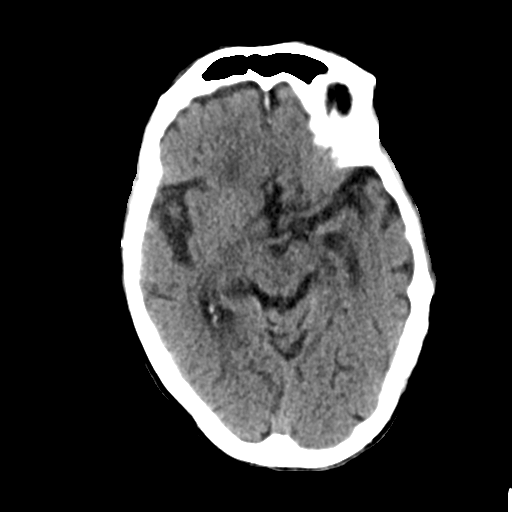
[im 16/31  brain]
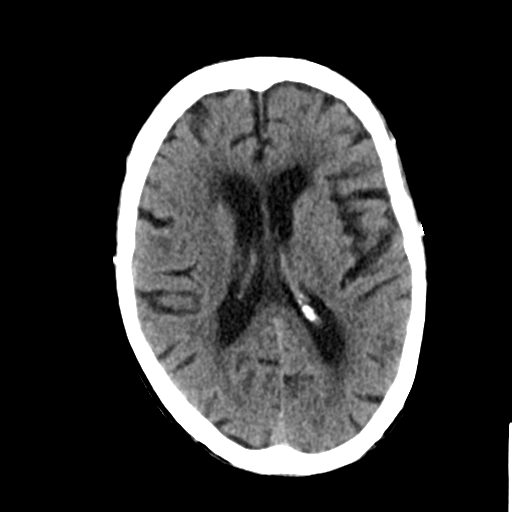
[im 19/31  brain]
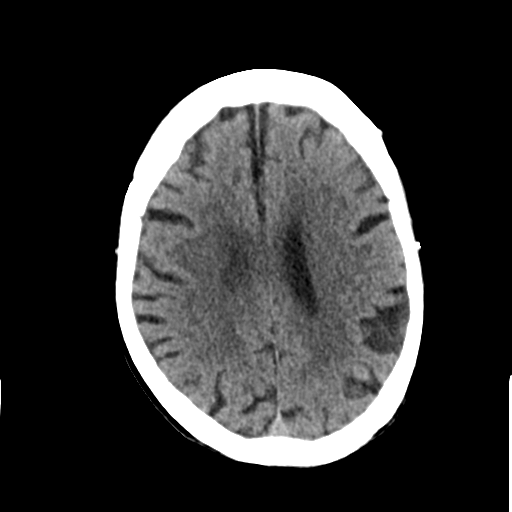
[im 19/31  bone]
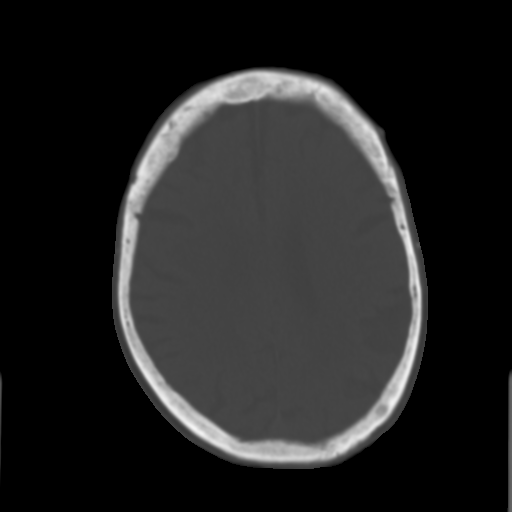
[im 23/31  brain]
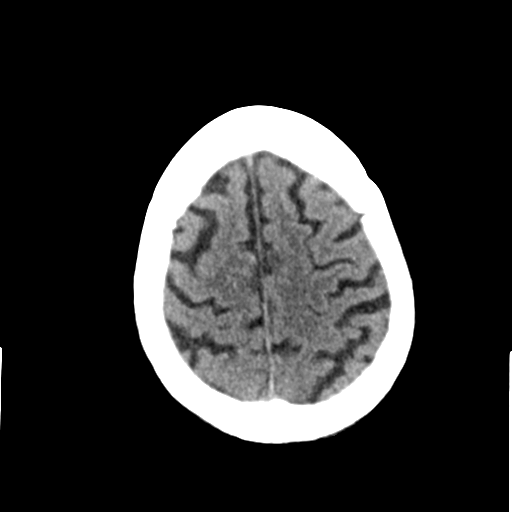
[im 27/31  brain]
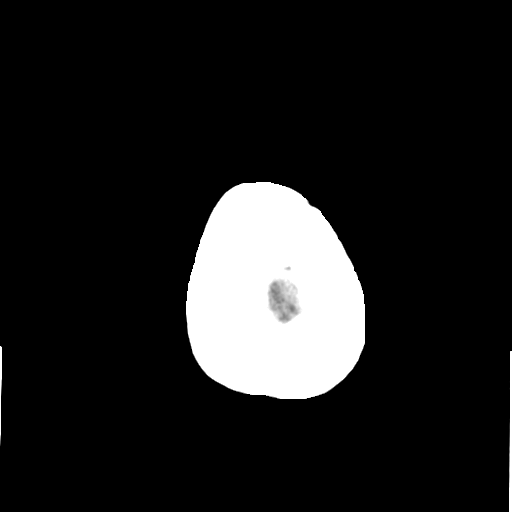

[Series 3: head bone · axial · 0.43mm/px · z∈[-115,+5]mm · 8 of 76 slices shown]
[im 8/76  bone]
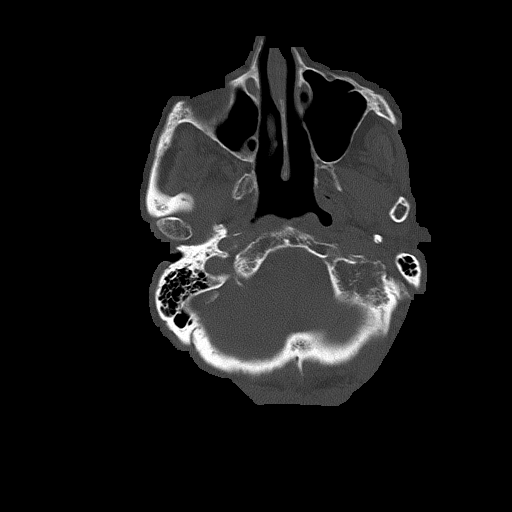
[im 16/76  bone]
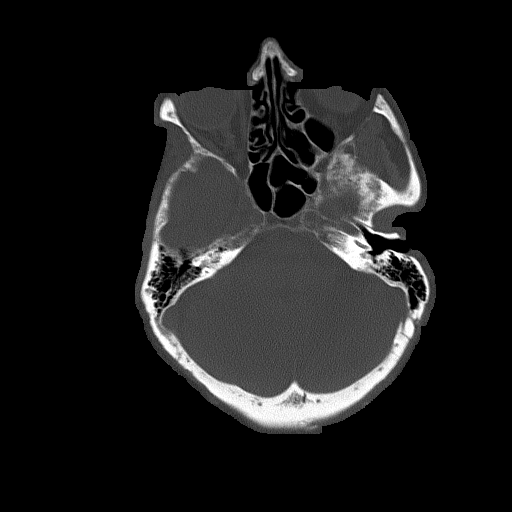
[im 23/76  bone]
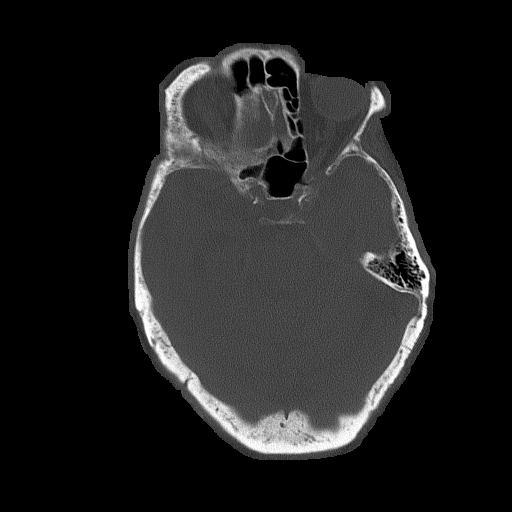
[im 34/76  bone]
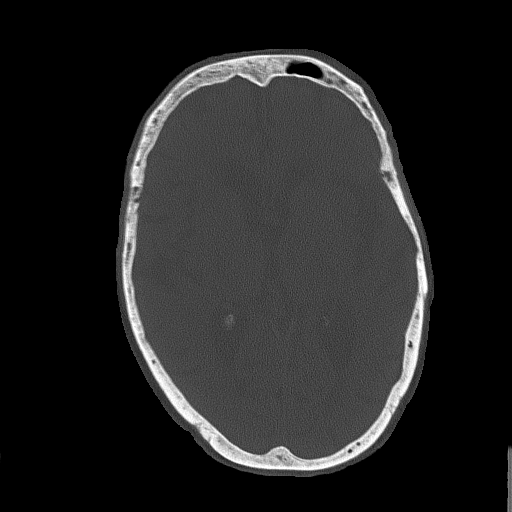
[im 42/76  bone]
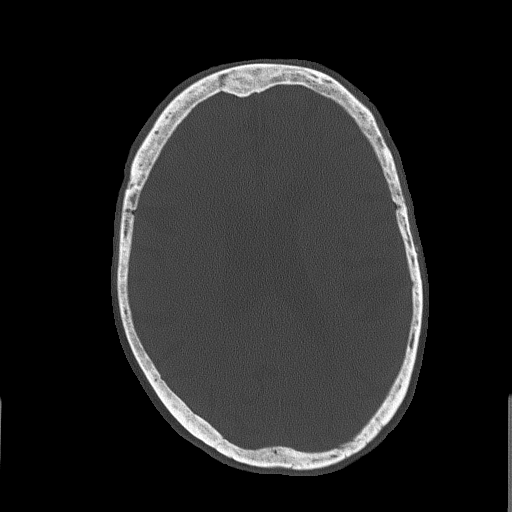
[im 53/76  bone]
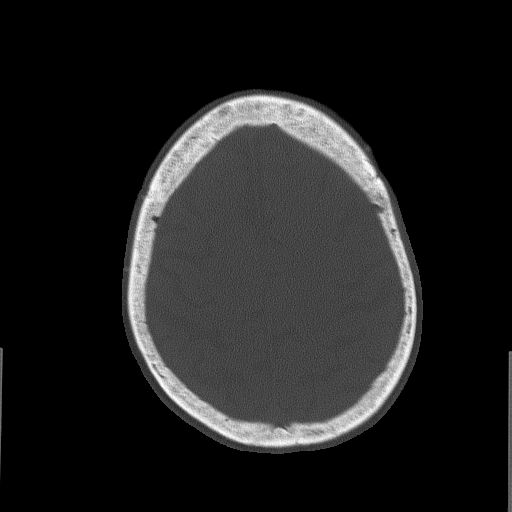
[im 61/76  bone]
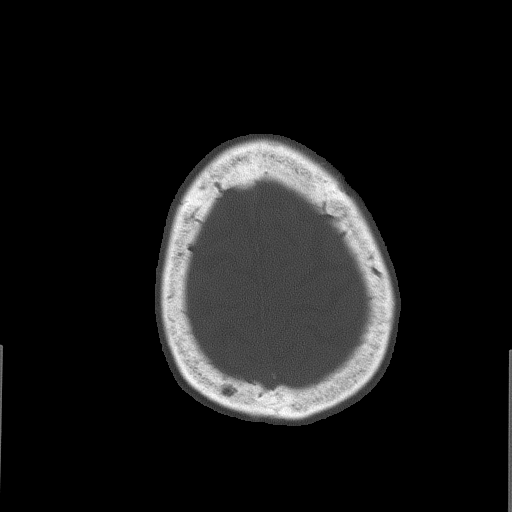
[im 68/76  bone]
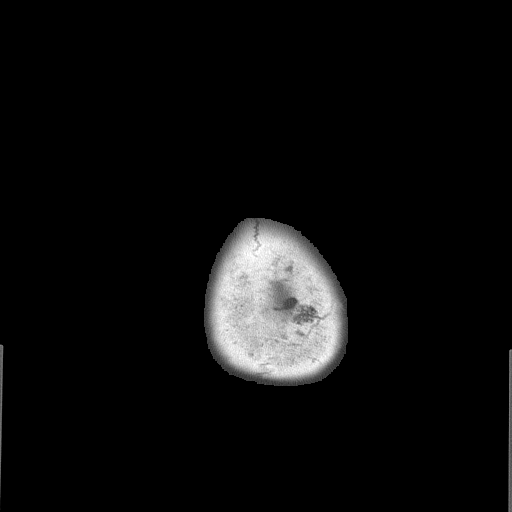

[15 of 30 positions shown; findings below may reference images not displayed]

FINDINGS: INTRACRANIAL CONTENTS: The ventricles and sulci are normal for age.
No intraparenchymal hemorrhage, mass effect nor midline shift.
Patchy supratentorial white matter hypodensities are within normal
range for patient's age and though non-specific likely represent
chronic small vessel ischemic disease. No acute large vascular
territory infarcts. No abnormal extra-axial fluid collections. Basal
cisterns are patent. Moderate calcific atherosclerosis of the
carotid siphons.

ORBITS: The included ocular globes and orbital contents are
non-suspicious. Status post bilateral ocular lens implants.

SINUSES: The mastoid aircells and included paranasal sinuses are
well-aerated.

SKULL/SOFT TISSUES: No skull fracture. No significant soft tissue
swelling. Osteopenia.
IMPRESSION: No acute intracranial process.

Stable involutional changes and moderate chronic small vessel
ischemic disease.

## 2016-08-28 ENCOUNTER — Other Ambulatory Visit: Payer: Self-pay | Admitting: Family Medicine

## 2016-08-29 MED ORDER — ACETAMINOPHEN 167 MG/5ML PO LIQD: ORAL | 4 refills | Status: AC

## 2016-08-29 NOTE — Telephone Encounter (Signed)
Received a notification in [employer] that there was an error sending Rx the 1st time, Rx resent

## 2016-08-29 NOTE — Telephone Encounter (Signed)
Will refill electronically  

## 2016-08-29 NOTE — Telephone Encounter (Signed)
Electronic refill request, please advise  

## 2016-09-29 ENCOUNTER — Telehealth: Payer: Self-pay | Admitting: Family Medicine

## 2016-09-29 NOTE — Telephone Encounter (Signed)
Thanks for letting me know, we will miss her

## 2016-09-29 NOTE — Telephone Encounter (Signed)
Patient's daughter,Frances,called to let Dr.Tower know patient passed away on October 12, 2016. She died at home and [name] was with her when she passed away.

## 2016-10-16 DEATH — deceased
